# Patient Record
Sex: Male | Born: 1940 | Race: White | Hispanic: No | Smoking: Former smoker
Health system: Southern US, Community
[De-identification: ages and names within clinical notes are randomized; demographics above are authoritative.]

## PROBLEM LIST (undated history)

## (undated) DIAGNOSIS — I10 Essential (primary) hypertension: Secondary | ICD-10-CM

## (undated) DIAGNOSIS — G4730 Sleep apnea, unspecified: Secondary | ICD-10-CM

## (undated) DIAGNOSIS — R011 Cardiac murmur, unspecified: Secondary | ICD-10-CM

## (undated) DIAGNOSIS — Z9981 Dependence on supplemental oxygen: Secondary | ICD-10-CM

## (undated) DIAGNOSIS — F32A Depression, unspecified: Secondary | ICD-10-CM

## (undated) DIAGNOSIS — J449 Chronic obstructive pulmonary disease, unspecified: Secondary | ICD-10-CM

## (undated) DIAGNOSIS — J439 Emphysema, unspecified: Secondary | ICD-10-CM

## (undated) DIAGNOSIS — K529 Noninfective gastroenteritis and colitis, unspecified: Secondary | ICD-10-CM

## (undated) DIAGNOSIS — Z972 Presence of dental prosthetic device (complete) (partial): Secondary | ICD-10-CM

## (undated) DIAGNOSIS — K219 Gastro-esophageal reflux disease without esophagitis: Secondary | ICD-10-CM

## (undated) DIAGNOSIS — IMO0001 Reserved for inherently not codable concepts without codable children: Secondary | ICD-10-CM

## (undated) DIAGNOSIS — F329 Major depressive disorder, single episode, unspecified: Secondary | ICD-10-CM

## (undated) HISTORY — PX: CATARACT EXTRACTION: SUR2

## (undated) HISTORY — PX: LUNG SURGERY: SHX703

## (undated) HISTORY — PX: WRIST SURGERY: SHX841

## (undated) HISTORY — PX: EYE SURGERY: SHX253

## (undated) HISTORY — PX: KNEE SURGERY: SHX244

---

## 2005-05-10 ENCOUNTER — Emergency Department: Payer: Self-pay | Admitting: Internal Medicine

## 2010-12-13 ENCOUNTER — Ambulatory Visit: Payer: Self-pay | Admitting: Family Medicine

## 2011-04-05 ENCOUNTER — Encounter (HOSPITAL_COMMUNITY)
Admission: RE | Admit: 2011-04-05 | Discharge: 2011-04-05 | Disposition: A | Discharge status: Home / Self Care | Payer: BC Managed Care – PPO | Source: Ambulatory Visit | Attending: Orthopedic Surgery | Admitting: Orthopedic Surgery

## 2011-04-05 ENCOUNTER — Ambulatory Visit (HOSPITAL_COMMUNITY)
Admission: RE | Admit: 2011-04-05 | Discharge: 2011-04-05 | Disposition: A | Discharge status: Home / Self Care | Payer: BC Managed Care – PPO | Source: Ambulatory Visit | Attending: Orthopedic Surgery | Admitting: Orthopedic Surgery

## 2011-04-05 ENCOUNTER — Other Ambulatory Visit (HOSPITAL_COMMUNITY): Payer: Self-pay | Admitting: Orthopedic Surgery

## 2011-04-05 DIAGNOSIS — M25531 Pain in right wrist: Secondary | ICD-10-CM

## 2011-04-05 DIAGNOSIS — J438 Other emphysema: Secondary | ICD-10-CM | POA: Insufficient documentation

## 2011-04-05 DIAGNOSIS — Z01810 Encounter for preprocedural cardiovascular examination: Secondary | ICD-10-CM | POA: Insufficient documentation

## 2011-04-05 DIAGNOSIS — Z01818 Encounter for other preprocedural examination: Secondary | ICD-10-CM | POA: Insufficient documentation

## 2011-04-05 DIAGNOSIS — Z01812 Encounter for preprocedural laboratory examination: Secondary | ICD-10-CM | POA: Insufficient documentation

## 2011-04-05 LAB — BASIC METABOLIC PANEL
BUN: 21 mg/dL (ref 6–23)
CO2: 28 mEq/L (ref 19–32)
Calcium: 9.6 mg/dL (ref 8.4–10.5)
Chloride: 105 mEq/L (ref 96–112)
Creatinine, Ser: 1.45 mg/dL — ABNORMAL HIGH (ref 0.50–1.35)
GFR calc Af Amer: 55 mL/min — ABNORMAL LOW (ref >90)
GFR calc non Af Amer: 48 mL/min — ABNORMAL LOW (ref >90)
Glucose, Bld: 122 mg/dL — ABNORMAL HIGH (ref 70–99)
Potassium: 5.3 mEq/L — ABNORMAL HIGH (ref 3.5–5.1)
Sodium: 141 mEq/L (ref 135–145)

## 2011-04-05 LAB — SURGICAL PCR SCREEN
MRSA, PCR: NEGATIVE
Staphylococcus aureus: NEGATIVE

## 2011-04-05 LAB — CBC
HCT: 42.2 % (ref 39.0–52.0)
Hemoglobin: 13.9 g/dL (ref 13.0–17.0)
MCH: 30.4 pg (ref 26.0–34.0)
MCHC: 32.9 g/dL (ref 30.0–36.0)
MCV: 92.3 fL (ref 78.0–100.0)
Platelets: 272 10*3/uL (ref 150–400)
RBC: 4.57 MIL/uL (ref 4.22–5.81)
RDW: 20.5 % — ABNORMAL HIGH (ref 11.5–15.5)
WBC: 6.7 10*3/uL (ref 4.0–10.5)

## 2011-04-12 ENCOUNTER — Other Ambulatory Visit: Payer: Self-pay | Admitting: Orthopedic Surgery

## 2011-04-12 ENCOUNTER — Ambulatory Visit (HOSPITAL_COMMUNITY)
Admission: RE | Admit: 2011-04-12 | Discharge: 2011-04-12 | Disposition: A | Discharge status: Home / Self Care | Payer: BC Managed Care – PPO | Source: Ambulatory Visit | Attending: Orthopedic Surgery | Admitting: Orthopedic Surgery

## 2011-04-12 DIAGNOSIS — G5600 Carpal tunnel syndrome, unspecified upper limb: Secondary | ICD-10-CM | POA: Insufficient documentation

## 2011-04-12 DIAGNOSIS — N289 Disorder of kidney and ureter, unspecified: Secondary | ICD-10-CM | POA: Insufficient documentation

## 2011-04-12 DIAGNOSIS — J4489 Other specified chronic obstructive pulmonary disease: Secondary | ICD-10-CM | POA: Insufficient documentation

## 2011-04-12 DIAGNOSIS — K5190 Ulcerative colitis, unspecified, without complications: Secondary | ICD-10-CM | POA: Insufficient documentation

## 2011-04-12 DIAGNOSIS — G4730 Sleep apnea, unspecified: Secondary | ICD-10-CM | POA: Insufficient documentation

## 2011-04-12 DIAGNOSIS — Z01818 Encounter for other preprocedural examination: Secondary | ICD-10-CM | POA: Insufficient documentation

## 2011-04-12 DIAGNOSIS — IMO0002 Reserved for concepts with insufficient information to code with codable children: Secondary | ICD-10-CM | POA: Insufficient documentation

## 2011-04-12 DIAGNOSIS — M19039 Primary osteoarthritis, unspecified wrist: Secondary | ICD-10-CM | POA: Insufficient documentation

## 2011-04-12 DIAGNOSIS — M21939 Unspecified acquired deformity of unspecified forearm: Secondary | ICD-10-CM | POA: Insufficient documentation

## 2011-04-12 DIAGNOSIS — K219 Gastro-esophageal reflux disease without esophagitis: Secondary | ICD-10-CM | POA: Insufficient documentation

## 2011-04-12 DIAGNOSIS — J449 Chronic obstructive pulmonary disease, unspecified: Secondary | ICD-10-CM | POA: Insufficient documentation

## 2011-04-12 DIAGNOSIS — Z01812 Encounter for preprocedural laboratory examination: Secondary | ICD-10-CM | POA: Insufficient documentation

## 2011-04-12 DIAGNOSIS — Z01810 Encounter for preprocedural cardiovascular examination: Secondary | ICD-10-CM | POA: Insufficient documentation

## 2011-04-18 NOTE — Op Note (Signed)
Darin Lopez, Darin Lopez NO.:  [id number]  MEDICAL RECORD NO.:  [id number]  LOCATION:  [facility]                         FACILITY:  [facility]  PHYSICIAN:  Artist Pais. 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  OPERATIVE PROCEDURE:  Right wrist proximal row carpectomy with posterior interosseous nerve neurectomy and right carpal tunnel release through separate incision.  SURGEON:  Artist Pais. Mina Marble, MD  ASSISTANT:  None.  ANESTHESIA:  Axillary block and IV sedation.  TOURNIQUET TIME:  59 minutes.  COMPLICATION:  None.  DRAINS:  None.  The patient was taken to the operating suite after induction of adequate IV sedation and axillary block analgesia.  Right upper extremity was prepped and draped in the usual sterile fashion.  An Esmarch was used to exsanguinate the limb.  Tourniquet was inflated to 275 mmHg.  At this point in time, a 2-cm incision was made in the palmar aspect of the right hand in line with long finger metacarpal starting at [facility] cardinal line.  Skin was incised.  Palmar fascia was identified and split.  Distal edge of the transverse carpal was identified with a #15 blade.  The median nerve was identified and protected with a Therapist, nutritional.  Remaining aspects of the transverse carpal ligament were then divided under direct vision using curved blunt scissors.  The canal was inspected.  There was no osseous lesion or gangrene was present.  It was irrigated and loosely closed with a 3-0 Prolene subcuticular stitch. The hand was then fully pronated.  Midline incision was made over the radiocarpal  articulation.  Skin was incised longitudinally.  Dorsal veins and the extensor mechanism were carefully identified and retracted.  The interval between the second and fourth dorsal compartments was identified.  The sheath overlying the second dorsal compartment was incised.  These tendons were retracted to the radial side and the fourth dorsal compartment to the ulnar side and a midline dissection was carried down to the level of the floor of the fourth dorsal compartment where the posterior osseous nerve was identified and transected and was sent for pathologic confirmation.  Posterior interosseous artery was carefully cauterized.  Next, the midline incision was made in the capsule.  Dissection was carried down to the proximal carpal row.  There was significant gapping of the scapholunate interval with DJD changes at the radioscaphoid articulation.  The scaphoid, the lunate, and the triquetrum were carefully removed in piecemeal using curettes, osteotomes, and rongeurs.  Once this was done, all bone fragments were carefully excised off the volar capsule.  There was some slight wearing of the capitate head.  Intraoperative fluoroscopy revealed the good placement of the capitate head into the lunate fossa.  At this point in time, a small capsular flap was developed and sewn to the volar aspect of the wound to cover the small area denuded cartilage on the capitate head.  Once this was done, the wound was again thoroughly irrigated and loosely closed in layers of 2-0 Ethibond to close the capsule, which was also used to suture the capsular flap to the volar capsule and then 4-0 Vicryl to realign the extensor mechanism.  The wound was then closed with 3-0 Prolene subcuticular stitch, Steri-Strips, 4 x 4s fluffs, and a volar splint was applied.  The patient tolerated the procedure well and went to the recovery room in stable fashion.     Artist Pais Mina Marble, M.D.     MAW/MEDQ  D:   04/12/2011  T:  04/12/2011  Job:  [id number]  Electronically Signed by Dairl Ponder M.D. on 04/18/2011 02:07:13 PM

## 2011-06-17 ENCOUNTER — Emergency Department: Payer: Self-pay | Admitting: Emergency Medicine

## 2011-09-13 ENCOUNTER — Other Ambulatory Visit: Payer: Self-pay | Admitting: Orthopedic Surgery

## 2011-10-02 ENCOUNTER — Encounter (HOSPITAL_COMMUNITY): Payer: Self-pay | Admitting: Pharmacy Technician

## 2011-10-04 ENCOUNTER — Encounter (HOSPITAL_COMMUNITY)
Admission: RE | Admit: 2011-10-04 | Discharge: 2011-10-04 | Disposition: A | Discharge status: Home / Self Care | Payer: BC Managed Care – PPO | Source: Ambulatory Visit | Attending: Orthopedic Surgery | Admitting: Orthopedic Surgery

## 2011-10-04 ENCOUNTER — Encounter (HOSPITAL_COMMUNITY): Payer: Self-pay

## 2011-10-04 HISTORY — DX: Chronic obstructive pulmonary disease, unspecified: J44.9

## 2011-10-04 HISTORY — DX: Dependence on supplemental oxygen: Z99.81

## 2011-10-04 HISTORY — DX: Major depressive disorder, single episode, unspecified: F32.9

## 2011-10-04 HISTORY — DX: Depression, unspecified: F32.A

## 2011-10-04 HISTORY — DX: Noninfective gastroenteritis and colitis, unspecified: K52.9

## 2011-10-04 HISTORY — DX: Gastro-esophageal reflux disease without esophagitis: K21.9

## 2011-10-04 HISTORY — DX: Emphysema, unspecified: J43.9

## 2011-10-04 LAB — CBC
Hemoglobin: 13.9 g/dL (ref 13.0–17.0)
MCH: 33.1 pg (ref 26.0–34.0)
MCHC: 33.1 g/dL (ref 30.0–36.0)
RDW: 17.7 % — ABNORMAL HIGH (ref 11.5–15.5)

## 2011-10-04 LAB — SURGICAL PCR SCREEN
MRSA, PCR: NEGATIVE
Staphylococcus aureus: NEGATIVE

## 2011-10-04 LAB — BASIC METABOLIC PANEL
BUN: 13 mg/dL (ref 6–23)
Creatinine, Ser: 0.86 mg/dL (ref 0.50–1.35)
GFR calc Af Amer: >90 mL/min (ref >90)
GFR calc non Af Amer: 86 mL/min — ABNORMAL LOW (ref >90)
Glucose, Bld: 102 mg/dL — ABNORMAL HIGH (ref 70–99)
Potassium: 3.9 mEq/L (ref 3.5–5.1)

## 2011-10-04 MED ORDER — CHLORHEXIDINE GLUCONATE 4 % EX LIQD: 60.0000 mL | Freq: Once | CUTANEOUS | Status: DC

## 2011-10-04 NOTE — Pre-Procedure Instructions (Signed)
20 Darin Lopez  10/04/2011   Your procedure is scheduled on:  October 12, 2011  Report to [facility] at 7:30 AM.  Call this number if you have problems the morning of surgery: [phone]   Remember:   Do not eat food:After Midnight.  May have clear liquids: up to 4 Hours before arrival.  Clear liquids include soda, tea, black coffee, apple or grape juice, broth.  Take these medicines the morning of surgery with A SIP OF WATER: PRILOSEC,INHALERS, ZOLOFT,BACTRIM   Do not wear jewelry, make-up or nail polish.  Do not wear lotions, powders, or perfumes. You may wear deodorant.  Do not shave 48 hours prior to surgery.  Do not bring valuables to the hospital.  Contacts, dentures or bridgework may not be worn into surgery.  Leave suitcase in the car. After surgery it may be brought to your room.  For patients admitted to the hospital, checkout time is 11:00 AM the day of discharge.   Patients discharged the day of surgery will not be allowed to drive home.  Name and phone number of your driver: Herma Ard  Special Instructions: Incentive Spirometry - Practice and bring it with you on the day of surgery. and CHG Shower Use Special Wash: 1/2 bottle night before surgery and 1/2 bottle morning of surgery.   Please read over the following fact sheets that you were given: Pain Booklet, MRSA Information and Surgical Site Infection Prevention

## 2011-10-07 NOTE — Consult Note (Addendum)
Anesthesia:  Patient is a 71 year old male scheduled for a open carpel tunnel release on the right on 10/12/11.  Posted for [facility].  His history is significant for COPD/emphysema with prior bilateral upper lobectomies for severe emphysematous changes (bilateral lung volume reduction surgery), home 02, OSA with CPAP, former smoker, GERD, depression, colitis.  He is s/p right wrist proximal row carpectomy on 04/12/11 using axillary block and IV sedation.  I was not asked to see Mr. Dimaio during his PAT visit.  RR was 22, 02 sat was 92% at PAT.  EKG from 04/12/11 shows NSR.  He had a echo at [facility] on 12/13/10 that showed normal LV size and function, EF 50-55%, LA mildly dilated, fibrocalcified AV without significant AS.  (Copies on chart, from [facility]  CXR from 04/05/11 showed: No evidence of acute cardiopulmonary disease.  Emphysematous changes.  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  His last sleep studies from [facility] are > 3 year (2009, 2010), but copies are on his chart (from [facility]).  Labs acceptable.  I was unable to reach him on his home or cell number today, but I left a message asking him to call me.  I'd like to confirm that he feels at his baseline from a pulmonary standpoint.  He tolerated prior wrist surgery in October, so would anticipate he could proceed if remains stable from a Pulmonary standpoint.  Addendum: 10/10/11 1615  Patient called me back yesterday. He sees a Diplomatic Services operational officer (Dr. Marlane Mingle) at the [facility] in [place].  His breathing is at baseline.  He denies SOB at rest.  He does have DOE with moderate activity like walking up an incline.  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  Last records from Dr. Sunday Spillers reviewed.  He was last seen on 03/28/11.  As above, subjectively he is at his baseline and tolerated similar procedure  in October of last year.  If no new respiratory issues, then anticipate he can proceed.  Anesthesiologist Dr. Chaney Malling agrees with plan.

## 2011-10-10 NOTE — Progress Notes (Signed)
Requested pul. Notes from Dr. Sunday Spillers at [facility] in [facility]

## 2011-10-11 MED ORDER — CEFAZOLIN SODIUM-DEXTROSE 2-3 GM-% IV SOLR
2.0000 g | INTRAVENOUS | Status: AC
  Administered 2011-10-12: 2 g via INTRAVENOUS
  Filled 2011-10-11: qty 50

## 2011-10-11 MED ORDER — CEFAZOLIN SODIUM-DEXTROSE 2-3 GM-% IV SOLR
2.0000 g | INTRAVENOUS | Status: DC
  Filled 2011-10-11: qty 50

## 2011-10-12 ENCOUNTER — Encounter (HOSPITAL_COMMUNITY): Payer: Self-pay | Admitting: Vascular Surgery

## 2011-10-12 ENCOUNTER — Encounter (HOSPITAL_COMMUNITY)
Admission: RE | Disposition: A | Discharge status: Home / Self Care | Payer: Self-pay | Source: Ambulatory Visit | Attending: Orthopedic Surgery

## 2011-10-12 ENCOUNTER — Ambulatory Visit (HOSPITAL_COMMUNITY)
Admission: RE | Admit: 2011-10-12 | Discharge: 2011-10-12 | Disposition: A | Discharge status: Home / Self Care | Payer: BC Managed Care – PPO | Source: Ambulatory Visit | Attending: Orthopedic Surgery | Admitting: Orthopedic Surgery

## 2011-10-12 ENCOUNTER — Ambulatory Visit (HOSPITAL_COMMUNITY): Payer: BC Managed Care – PPO | Admitting: Vascular Surgery

## 2011-10-12 DIAGNOSIS — G5600 Carpal tunnel syndrome, unspecified upper limb: Secondary | ICD-10-CM | POA: Insufficient documentation

## 2011-10-12 DIAGNOSIS — G4730 Sleep apnea, unspecified: Secondary | ICD-10-CM | POA: Insufficient documentation

## 2011-10-12 DIAGNOSIS — Z01812 Encounter for preprocedural laboratory examination: Secondary | ICD-10-CM | POA: Insufficient documentation

## 2011-10-12 DIAGNOSIS — J449 Chronic obstructive pulmonary disease, unspecified: Secondary | ICD-10-CM | POA: Insufficient documentation

## 2011-10-12 DIAGNOSIS — I10 Essential (primary) hypertension: Secondary | ICD-10-CM | POA: Insufficient documentation

## 2011-10-12 DIAGNOSIS — J4489 Other specified chronic obstructive pulmonary disease: Secondary | ICD-10-CM | POA: Insufficient documentation

## 2011-10-12 DIAGNOSIS — G5601 Carpal tunnel syndrome, right upper limb: Secondary | ICD-10-CM

## 2011-10-12 DIAGNOSIS — K219 Gastro-esophageal reflux disease without esophagitis: Secondary | ICD-10-CM | POA: Insufficient documentation

## 2011-10-12 HISTORY — PX: CARPAL TUNNEL RELEASE: SHX101

## 2011-10-12 SURGERY — CARPAL TUNNEL RELEASE
Anesthesia: Choice | Site: Wrist | Laterality: Right | Wound class: Clean

## 2011-10-12 MED ORDER — OXYCODONE-ACETAMINOPHEN 5-325 MG PO TABS: 1.0000 | ORAL_TABLET | ORAL | Status: AC | PRN

## 2011-10-12 MED ORDER — LACTATED RINGERS IV SOLN
INTRAVENOUS | Status: DC | PRN
  Administered 2011-10-12: 09:00:00 via INTRAVENOUS

## 2011-10-12 MED ORDER — EPHEDRINE SULFATE 50 MG/ML IJ SOLN
INTRAMUSCULAR | Status: DC | PRN
  Administered 2011-10-12: 5 mg via INTRAVENOUS

## 2011-10-12 MED ORDER — OXYCODONE-ACETAMINOPHEN 5-325 MG PO TABS
1.0000 | ORAL_TABLET | ORAL | Status: DC | PRN
  Administered 2011-10-12: 1 via ORAL

## 2011-10-12 MED ORDER — FENTANYL CITRATE 0.05 MG/ML IJ SOLN: 25.0000 ug | INTRAMUSCULAR | Status: DC | PRN

## 2011-10-12 MED ORDER — BUPIVACAINE HCL (PF) 0.25 % IJ SOLN
INTRAMUSCULAR | Status: DC | PRN
  Administered 2011-10-12: 7 mL

## 2011-10-12 MED ORDER — PROPOFOL 10 MG/ML IV BOLUS
INTRAVENOUS | Status: DC | PRN
  Administered 2011-10-12: 200 mg via INTRAVENOUS

## 2011-10-12 MED ORDER — FENTANYL CITRATE 0.05 MG/ML IJ SOLN
INTRAMUSCULAR | Status: DC | PRN
  Administered 2011-10-12 (×3): 50 ug via INTRAVENOUS

## 2011-10-12 MED ORDER — ONDANSETRON HCL 4 MG/2ML IJ SOLN
INTRAMUSCULAR | Status: DC | PRN
  Administered 2011-10-12: 4 mg via INTRAVENOUS

## 2011-10-12 MED ORDER — 0.9 % SODIUM CHLORIDE (POUR BTL) OPTIME
TOPICAL | Status: DC | PRN
  Administered 2011-10-12: 1000 mL

## 2011-10-12 SURGICAL SUPPLY — 44 items
BANDAGE ELASTIC 3 VELCRO ST LF (GAUZE/BANDAGES/DRESSINGS) ×2 IMPLANT
BANDAGE ELASTIC 4 VELCRO ST LF (GAUZE/BANDAGES/DRESSINGS) ×2 IMPLANT
BANDAGE GAUZE ELAST BULKY 4 IN (GAUZE/BANDAGES/DRESSINGS) ×2 IMPLANT
BNDG CMPR 9X4 STRL LF SNTH (GAUZE/BANDAGES/DRESSINGS) ×1
BNDG ESMARK 4X9 LF (GAUZE/BANDAGES/DRESSINGS) ×2 IMPLANT
CLOTH BEACON ORANGE TIMEOUT ST (SAFETY) ×2 IMPLANT
CORDS BIPOLAR (ELECTRODE) ×2 IMPLANT
COVER MAYO STAND STRL (DRAPES) IMPLANT
COVER SURGICAL LIGHT HANDLE (MISCELLANEOUS) ×2 IMPLANT
CUFF TOURNIQUET SINGLE 18IN (TOURNIQUET CUFF) ×2 IMPLANT
CUFF TOURNIQUET SINGLE 24IN (TOURNIQUET CUFF) IMPLANT
DRAPE SURG 17X23 STRL (DRAPES) ×2 IMPLANT
DURAPREP 26ML APPLICATOR (WOUND CARE) ×2 IMPLANT
GAUZE XEROFORM 1X8 LF (GAUZE/BANDAGES/DRESSINGS) ×2 IMPLANT
GLOVE BIO SURGEON STRL SZ8.5 (GLOVE) ×2 IMPLANT
GOWN PREVENTION PLUS XXLARGE (GOWN DISPOSABLE) ×2 IMPLANT
GOWN SRG XL XLNG 56XLVL 4 (GOWN DISPOSABLE) ×2 IMPLANT
GOWN STRL NON-REIN LRG LVL3 (GOWN DISPOSABLE) IMPLANT
GOWN STRL NON-REIN XL XLG LVL4 (GOWN DISPOSABLE) ×4
KIT BASIN OR (CUSTOM PROCEDURE TRAY) IMPLANT
KIT ROOM TURNOVER OR (KITS) ×2 IMPLANT
NEEDLE 22X1 1/2 (OR ONLY) (NEEDLE) IMPLANT
NEEDLE HYPO 25GX1X1/2 BEV (NEEDLE) ×2 IMPLANT
NS IRRIG 1000ML POUR BTL (IV SOLUTION) ×2 IMPLANT
PACK ORTHO EXTREMITY (CUSTOM PROCEDURE TRAY) ×2 IMPLANT
PAD ARMBOARD 7.5X6 YLW CONV (MISCELLANEOUS) ×2 IMPLANT
PAD CAST 4YDX4 CTTN HI CHSV (CAST SUPPLIES) ×1 IMPLANT
PADDING CAST ABS 3INX4YD NS (CAST SUPPLIES) ×1
PADDING CAST ABS COTTON 3X4 (CAST SUPPLIES) ×1 IMPLANT
PADDING CAST COTTON 4X4 STRL (CAST SUPPLIES) ×2
SPLINT PLASTER CAST XFAST 4X15 (CAST SUPPLIES) ×1 IMPLANT
SPLINT PLASTER XTRA FAST SET 4 (CAST SUPPLIES) ×1
SPONGE GAUZE 4X4 12PLY (GAUZE/BANDAGES/DRESSINGS) ×2 IMPLANT
STRIP CLOSURE SKIN 1/2X4 (GAUZE/BANDAGES/DRESSINGS) ×2 IMPLANT
SUT ETHILON 4 0 PS 2 18 (SUTURE) IMPLANT
SUT PROLENE 3 0 PS 2 (SUTURE) IMPLANT
SUT VIC AB 3-0 PS2 18 (SUTURE)
SUT VIC AB 3-0 PS2 18XBRD (SUTURE) IMPLANT
SUT VICRYL RAPIDE 4/0 PS 2 (SUTURE) ×4 IMPLANT
SYR CONTROL 10ML LL (SYRINGE) ×2 IMPLANT
TOWEL OR 17X24 6PK STRL BLUE (TOWEL DISPOSABLE) ×2 IMPLANT
TOWEL OR 17X26 10 PK STRL BLUE (TOWEL DISPOSABLE) ×2 IMPLANT
UNDERPAD 30X30 INCONTINENT (UNDERPADS AND DIAPERS) ×2 IMPLANT
WATER STERILE IRR 1000ML POUR (IV SOLUTION) ×2 IMPLANT

## 2011-10-12 NOTE — Op Note (Signed)
See dictated note [phone]

## 2011-10-12 NOTE — Anesthesia Postprocedure Evaluation (Signed)
  Anesthesia Post-op Note  Patient: Darin Lopez  Procedure(s) Performed: Procedure(s) (LRB): CARPAL TUNNEL RELEASE (Right)  Patient Location: PACU  Anesthesia Type: General  Level of Consciousness: awake, alert  and oriented  Airway and Oxygen Therapy: Patient Spontanous Breathing and Patient connected to nasal cannula oxygen  Post-op Pain: none  Post-op Assessment: Post-op Vital signs reviewed, Patient's Cardiovascular Status Stable, Respiratory Function Stable, Patent Airway, No signs of Nausea or vomiting and Pain level controlled  Post-op Vital Signs: Reviewed and stable  Complications: No apparent anesthesia complications

## 2011-10-12 NOTE — H&P (Signed)
Darin Lopez is an 71 y.o. male.   Chief Complaint: right recurrent carpal tunnel syndrome HPI: 71 y/o male with h/o right wrist PRC and CTR now with recurrent CTS  Past Medical History  Diagnosis Date  . COPD (chronic obstructive pulmonary disease)   . Emphysema of lung   . Colitis   . GERD (gastroesophageal reflux disease)   . Emphysema   . On home oxygen therapy   . Depression     Past Surgical History  Procedure Date  . Lung surgery     UPPER LOBE REMOVED FOR EMPHYSEMA  . Wrist surgery   . Knee surgery     LEFT BONE SPUR REMOVAL  . Eye surgery     BILATERAL    No family history on file. Social History:  reports that he has quit smoking. His smoking use included Cigarettes. He does not have any smokeless tobacco history on file. He reports that he does not drink alcohol or use illicit drugs.  Allergies:  Allergies  Allergen Reactions  . Adhesive (Tape) Other (See Comments)    SKIN RASH  . Aspirin Other (See Comments)    GI Bleed  . Flagyl (Metronidazole Hcl) Nausea And Vomiting    Medications Prior to Admission  Medication Dose Route Frequency Provider Last Rate Last Dose  . ceFAZolin (ANCEF) IVPB 2 g/50 mL premix  2 g Intravenous 60 min Pre-Op Marlowe Shores, MD      . DISCONTD: ceFAZolin (ANCEF) IVPB 2 g/50 mL premix  2 g Intravenous 60 min Pre-Op Marlowe Shores, MD       Medications Prior to Admission  Medication Sig Dispense Refill  . B Complex Vitamins (B COMPLEX PO) Take 1 tablet by mouth daily.      . Certolizumab Pegol (CIMZIA) 2 X 200 MG KIT Inject 400 mg into the skin every 14 (fourteen) days. 400 mg twice monthly      . diphenhydrAMINE (BENADRYL) 25 MG tablet Take 25 mg by mouth every evening.      . diphenoxylate-atropine (LOMOTIL) 2.5-0.025 MG per tablet Take 1 tablet by mouth 4 (four) times daily as needed. For diarrhea      . gabapentin (NEURONTIN) 300 MG capsule Take 300 mg by mouth 2 (two) times daily.      . mercaptopurine (PURINETHOL)  50 MG tablet Take 100 mg by mouth daily.      . mesalamine (PENTASA) 250 MG CR capsule Take 1,500 mg by mouth daily.      . mometasone (ASMANEX 30 METERED DOSES) 220 MCG/INH inhaler Inhale 1 puff into the lungs 2 (two) times daily.      Marland Kitchen omeprazole (PRILOSEC) 20 MG capsule Take 20 mg by mouth daily.      . predniSONE (DELTASONE) 10 MG tablet Take 10 mg by mouth daily.      . Probiotic Product (PROBIOTIC COMPLEX ACIDOPHILUS PO) Take 1 capsule by mouth daily.      . psyllium (METAMUCIL) 58.6 % powder Take 1 packet by mouth daily.      . salmeterol (SEREVENT) 50 MCG/DOSE diskus inhaler Inhale 1 puff into the lungs daily.      . sertraline (ZOLOFT) 50 MG tablet Take 50 mg by mouth daily.      Marland Kitchen sulfamethoxazole-trimethoprim (BACTRIM,SEPTRA) 400-80 MG per tablet Take 1 tablet by mouth daily.      Marland Kitchen terazosin (HYTRIN) 10 MG capsule Take 10 mg by mouth daily.      Marland Kitchen tiotropium (SPIRIVA) 18  MCG inhalation capsule Place 18 mcg into inhaler and inhale daily.      Marland Kitchen zolpidem (AMBIEN) 10 MG tablet Take 10 mg by mouth at bedtime.      Marland Kitchen CALCIUM CITRATE PO Take 1 tablet by mouth daily.        No results found for this or any previous visit (from the past 48 hour(s)). No results found.  Review of Systems  All other systems reviewed and are negative.    Blood pressure 142/84, pulse 72, temperature 97.6 F (36.4 C), temperature source Oral, resp. rate 20, SpO2 94.00%. Physical Exam  Constitutional: He is oriented to person, place, and time. He appears well-developed and well-nourished.  HENT:  Head: Normocephalic and atraumatic.  Cardiovascular: Normal rate.   Respiratory: Effort normal.  Musculoskeletal:       Right wrist: He exhibits tenderness.       Arms: Neurological: He is alert and oriented to person, place, and time.  Skin: Skin is warm.  Psychiatric: He has a normal mood and affect. His speech is normal and behavior is normal. Thought content normal.     Assessment/Plan 70y/o male  with recurrent right CTS  Plan open CTR  Darin Lopez A 10/12/2011, 7:43 AM

## 2011-10-12 NOTE — Discharge Instructions (Signed)
Carpal Tunnel Surgery The carpal tunnel is a narrow hollow area in the wrist. It is formed by the wrist bones and ligaments. Nerves, blood vessels, and tendons on the palm side of your hand pass through the carpal tunnel. (The palm side is the side of your hand in the direction your fingers bend.) Repeated wrist motion or certain diseases may cause swelling within the tunnel. That is why these are sometimes called repetitive trauma disorders. It is also a common problem in late pregnancy because of water retention. This swelling pinches the main nerve in the wrist (median nerve). It causes the painful condition called carpal tunnel syndrome. A feeling of "pins and needles" may be noticed in the fingers or hand. The entire arm may ache from this condition. Carpal tunnel syndrome may clear up by itself. Cortisone injections may help. An electromyogram may be needed to confirm this diagnosis. This is a test which measures nerve conduction. The nerve conduction is usually slowed in a carpal tunnel syndrome. Sometimes, an operation may be needed to free the pinched nerve.  LET YOUR CAREGIVER KNOW ABOUT:  Allergies   Medications taken including herbs, eye drops, over the counter medications, and creams   Use of steroids (by mouth or creams)   Previous problems with anesthetics or novocaine   Possibility of pregnancy, if this applies   History of blood clots (thrombophlebitis)   History of bleeding or blood problems   Previous surgery   Other health problems  RISKS AND COMPLICATIONS  Infection: A germ starts growing in the wound. This can usually be treated with antibiotics.   Damage to other organs may occur.   Bleeding following surgery can be a complication of almost all surgeries. Your surgeon takes every precaution to keep this from happening.   Recurrence (return) of carpal tunnel syndrome following treatment is rare.  BEFORE THE PROCEDURE  Stop smoking at least two weeks prior to  surgery. This lowers risk during surgery.   Stop non steroidal medicine for ten days prior to surgery. Also, do not take aspirin unless OK'd by your surgeon.   Your caregiver may tell you to stop taking certain medicine that may affect the outcome of the surgery and your ability to heal. For example, you may need to stop taking anti-inflammatories, such as aspirin, because of possible bleeding problems. Other medicine may have interactions with anesthesia.   BE SURE TO LET YOUR CAREGIVER KNOW IF YOU HAVE BEEN ON STEROIDS (INCLUDING CREAMS) FOR LONG PERIODS OF TIME. THIS IS CRITICAL.   Your caregiver will discuss possible risks and complications with you before surgery. In addition to the usual risks of anesthesia, other common risks and complications include:   Temporary increase in pain due to surgery.   Uncorrected pain.   Infection.  You should be present 60 minutes before your procedure or as directed.  PROCEDURE  Carpal tunnel release is generally recommended if symptoms last for 6 months. Surgery involves severing the band of tissue around the wrist to reduce pressure on the median nerve. Surgery is done under local anesthesia and does not require an overnight hospital stay. Many patients require surgery on both hands. The following are types of carpal tunnel release surgery:   Open release surgery, the traditional procedure used to correct carpal tunnel syndrome, consists of making an incision up to 2 inches in the wrist and then cutting the carpal ligament to enlarge the carpal tunnel. The procedure is generally done under local anesthesia on an outpatient  basis, unless there are unusual medical considerations.   Endoscopic surgery may allow faster functional recovery and less post-operative discomfort than traditional open release surgery. The surgeon makes two incisions (cuts) (about 1/2 inch each) in the wrist and palm, inserts a camera attached to a tube, looks at the tissue on a  screen, and cuts the carpal ligament (the tissue that holds joints together). This two-portal endoscopic surgery, generally performed under local anesthesia, is effective and minimizes scarring and scar tenderness, if any. One-portal endoscopic surgery for carpal tunnel syndrome is also available.  Although symptoms may be better right after surgery, full recovery from carpal tunnel surgery can take months. Some patients may have infection, nerve damage, stiffness, and pain at the scar. Sometimes the wrist loses strength because the carpal ligament is cut. Patients should take part in physical therapy after surgery to restore wrist strength. Some patients may need to adjust job duties or even change jobs after recovery from surgery. The majority of patients recover completely without complications (additional problems). AFTER THE PROCEDURE After surgery, you will be taken to the recovery area where a nurse will watch and check your progress. Once you're awake, stable, and taking fluids well, without other problems you will be allowed to go home. HOME CARE INSTRUCTIONS   Once at home, an ice pack applied to your operative site may help with discomfort and keep the swelling down.   Follow your caregiver's instructions as to activities, exercises, physical therapy, and driving a car.   Maintain strength and range of motion as instructed.   If you were given a splint to keep your wrist from bending, use it as instructed. It is important to wear the splint at night. Use the splint for as long as you have pain or numbness in your hand, arm or wrist. This may take 1 to 2 months.   If you have pain at night, it may help to elevate your hand above the level of your heart (the center of your chest).   It is important to avoid activities which originally caused your carpal tunnel syndrome for a couple weeks following surgery, or as directed by your surgeon. If your symptoms are work-related, you may need to  talk to your employer about changing to a job that does not require using your wrist.   Only take over-the-counter or prescription medicines for pain, discomfort, or fever as directed by your caregiver.   Following periods of extended use, particularly hard (strenuous) use, apply an ice pack wrapped in a towel to the palm (anterior) side of the affected wrist for 20 to 30 minutes. Repeat as needed three to four times per day. This will help reduce swelling following surgery.  SEEK MEDICAL CARE IF:   There is increased bleeding (more than a small spot) from the wound.   You notice redness, swelling, or increasing pain in the wound.   Pus is coming from wound.   An unexplained oral temperature above 102 F (38.9 C) develops.   You notice a foul smell coming from the wound or dressing.  SEEK IMMEDIATE MEDICAL CARE IF:   You develop a rash.   You have difficulty breathing.   You have any problems you think are related to allergies.  Document Released: 01/30/2004 Document Revised: 06/06/2011 Document Reviewed: 04/23/2007 [facility] Patient Information 2012 [place], [place].

## 2011-10-12 NOTE — Op Note (Signed)
Darin Lopez, FEILD NO.:  [id number]  MEDICAL RECORD NO.:  [id number]  LOCATION:  [facility]                         FACILITY:  [facility]  PHYSICIAN:  Artist Pais. Nahzir Pohle, M.D.DATE OF BIRTH:  02/26/1941  DATE OF PROCEDURE:  10/12/2011 DATE OF DISCHARGE:                              OPERATIVE REPORT   PREOPERATIVE DIAGNOSIS:  Chronic right carpal tunnel syndrome.  POSTOPERATIVE DIAGNOSIS:  Chronic right carpal tunnel syndrome.  PROCEDURE:  Right carpal tunnel release open with flexor synovectomy.  SURGEON:  Artist Pais. Mina Marble, M.D.  ASSISTANT:  None.  ANESTHESIA:  General.  COMPLICATION:  No complication.  DRAINS:  No drains.  SPECIMEN:  One specimen sent.  DESCRIPTION OF PROCEDURE:  The patient was taken to the operating suite. After induction of adequate general anesthesia, right upper extremity was prepped and draped in sterile fashion.  An Esmarch was used to exsanguinate the limb.  Tourniquet inflated 265 mmHg.  At this point in time, an incision made in the palmar aspect of the hand, then thenar crease going in [place] fashion across the distal forearm and wrist crease proximally in line with palmaris longus tendon.  Skin was incised.  Cutaneous vessels were carefully identified and retracted. The median nerve was identified in the antecubital fascia and released the level of the carpal canal.  The carpal canal was released.  There was significant scarring about the carpal canal and the nerve had an hourglass appearance.  There was also significant hypertrophic synovium throughout the entire FDS and FDP tendon compartments.  The wound was irrigated.  The synovium was carefully resected using a rongeur and tenotomy scissors.  There is again significant compression hourglass type at the level of the proximal carpal canal.  The nerve was then placed back in the carpal canal, and this was then irrigated and loosely closed with 4- 0 Vicryl Rapide suture.   Xeroform, 4x4s, fluffs, and a volar splint was applied.  The patient tolerated the procedure well and went to the recovery room in stable fashion.     Artist Pais Mina Marble, M.D.     MAW/MEDQ  D:  10/12/2011  T:  10/12/2011  Job:  [id number]

## 2011-10-12 NOTE — Transfer of Care (Signed)
Immediate Anesthesia Transfer of Care Note  Patient: Darin Lopez  Procedure(s) Performed: Procedure(s) (LRB): CARPAL TUNNEL RELEASE (Right)  Patient Location: PACU  Anesthesia Type: General  Level of Consciousness: patient cooperative, lethargic and responds to stimulation  Airway & Oxygen Therapy: Patient Spontanous Breathing and Patient connected to nasal cannula oxygen  Post-op Assessment: Report given to PACU RN and Post -op Vital signs reviewed and stable  Post vital signs: Reviewed and stable  Complications: No apparent anesthesia complications

## 2011-10-12 NOTE — Anesthesia Procedure Notes (Signed)
Procedure Name: LMA Insertion Date/Time: 10/12/2011 9:05 AM Performed by: Delbert Harness Pre-anesthesia Checklist: Patient identified, Timeout performed, Emergency Drugs available, Suction available and Patient being monitored Patient Re-evaluated:Patient Re-evaluated prior to inductionOxygen Delivery Method: Circle system utilized Preoxygenation: Pre-oxygenation with 100% oxygen Intubation Type: IV induction LMA: LMA with gastric port inserted LMA Size: 4.0 Number of attempts: 1 Placement Confirmation: positive ETCO2 and breath sounds checked- equal and bilateral Tube secured with: Tape Dental Injury: Teeth and Oropharynx as per pre-operative assessment  Comments: lower dental partial not secure, removed and labeled.

## 2011-10-12 NOTE — Brief Op Note (Signed)
10/12/2011  9:41 AM  PATIENT:  Darin Lopez  71 y.o. male  PRE-OPERATIVE DIAGNOSIS:  RECURRENT CARPAL TUNNEL SYNDROME RIGHT  POST-OPERATIVE DIAGNOSIS:  same  PROCEDURE:  Procedure(s) (LRB): CARPAL TUNNEL RELEASE (Right)  SURGEON:  Surgeon(s) and Role:    * Marlowe Shores, MD - Primary  PHYSICIAN ASSISTANT:   ASSISTANTS: none   ANESTHESIA:   general  EBL:  Total I/O In: 600 [I.V.:600] Out: -   BLOOD ADMINISTERED:none  DRAINS: none   LOCAL MEDICATIONS USED:  MARCAINE   8cc  SPECIMEN:  Excision  DISPOSITION OF SPECIMEN:  PATHOLOGY  COUNTS:  YES  TOURNIQUET:   Total Tourniquet Time Documented: Upper Arm (Right) - 27 minutes  DICTATION: .Other Dictation: Dictation Number [id number]  PLAN OF CARE: Discharge to home after PACU  PATIENT DISPOSITION:  PACU - hemodynamically stable.   Delay start of Pharmacological VTE agent (>24hrs) due to surgical blood loss or risk of bleeding: not applicable

## 2011-10-12 NOTE — Preoperative (Signed)
Beta Blockers   Reason not to administer Beta Blockers:Not Applicable 

## 2011-10-12 NOTE — Anesthesia Preprocedure Evaluation (Addendum)
Anesthesia Evaluation  Patient identified by MRN, date of birth, ID band Patient awake    Reviewed: Allergy & Precautions, H&P , NPO status , Patient's Chart, lab work & pertinent test results  History of Anesthesia Complications Negative for: history of anesthetic complications  Airway Mallampati: II TM Distance: >3 FB Neck ROM: Full    Dental  (+) Edentulous Upper and Implants   Pulmonary sleep apnea, Continuous Positive Airway Pressure Ventilation and Oxygen sleep apnea , COPD (steroid dependent, took prednisone today) COPD inhaler and oxygen dependent, former smoker (quit 30 years) Right upper lobectomies bilateral. See Pre-Anesthesia note breath sounds clear to auscultation  Pulmonary exam normal       Cardiovascular hypertension, Pt. on medications Rhythm:Regular Rate:Normal  EF 50-55%   Neuro/Psych PSYCHIATRIC DISORDERS Depression negative neurological ROS     GI/Hepatic Neg liver ROS, GERD-  Medicated and Controlled,  Endo/Other  negative endocrine ROS  Renal/GU negative Renal ROS     Musculoskeletal   Abdominal   Peds  Hematology negative hematology ROS (+)   Anesthesia Other Findings   Reproductive/Obstetrics                        Anesthesia Physical Anesthesia Plan  ASA: III  Anesthesia Plan: General   Post-op Pain Management:    Induction: Intravenous  Airway Management Planned: LMA  Additional Equipment:   Intra-op Plan:   Post-operative Plan:   Informed Consent: I have reviewed the patients History and Physical, chart, labs and discussed the procedure including the risks, benefits and alternatives for the proposed anesthesia with the patient or authorized representative who has indicated his/her understanding and acceptance.   Dental advisory given  Plan Discussed with: Surgeon and CRNA  Anesthesia Plan Comments: (Plan routine monitors, GA- LMA OK)         Anesthesia Quick Evaluation

## 2011-10-14 ENCOUNTER — Encounter (HOSPITAL_COMMUNITY): Payer: Self-pay | Admitting: Orthopedic Surgery

## 2012-01-15 DIAGNOSIS — R42 Dizziness and giddiness: Secondary | ICD-10-CM | POA: Insufficient documentation

## 2012-01-15 DIAGNOSIS — R251 Tremor, unspecified: Secondary | ICD-10-CM | POA: Insufficient documentation

## 2012-01-20 ENCOUNTER — Encounter: Payer: Self-pay | Admitting: Orthopedic Surgery

## 2012-01-30 ENCOUNTER — Encounter: Payer: Self-pay | Admitting: Orthopedic Surgery

## 2012-03-01 ENCOUNTER — Encounter: Payer: Self-pay | Admitting: Orthopedic Surgery

## 2012-04-24 DIAGNOSIS — H35379 Puckering of macula, unspecified eye: Secondary | ICD-10-CM | POA: Insufficient documentation

## 2012-04-24 DIAGNOSIS — Z961 Presence of intraocular lens: Secondary | ICD-10-CM | POA: Insufficient documentation

## 2012-10-23 DIAGNOSIS — K5190 Ulcerative colitis, unspecified, without complications: Secondary | ICD-10-CM | POA: Insufficient documentation

## 2012-10-23 DIAGNOSIS — J449 Chronic obstructive pulmonary disease, unspecified: Secondary | ICD-10-CM | POA: Insufficient documentation

## 2012-12-20 IMAGING — CR DG CHEST 2V
2 series · 2 of 2 positions shown · non-contrast
Comparison: None.

CLINICAL DATA: Preop right wrist corpectomy, history of emphysema

CHEST - 2 VIEW

[view not recorded (1 of 2)]
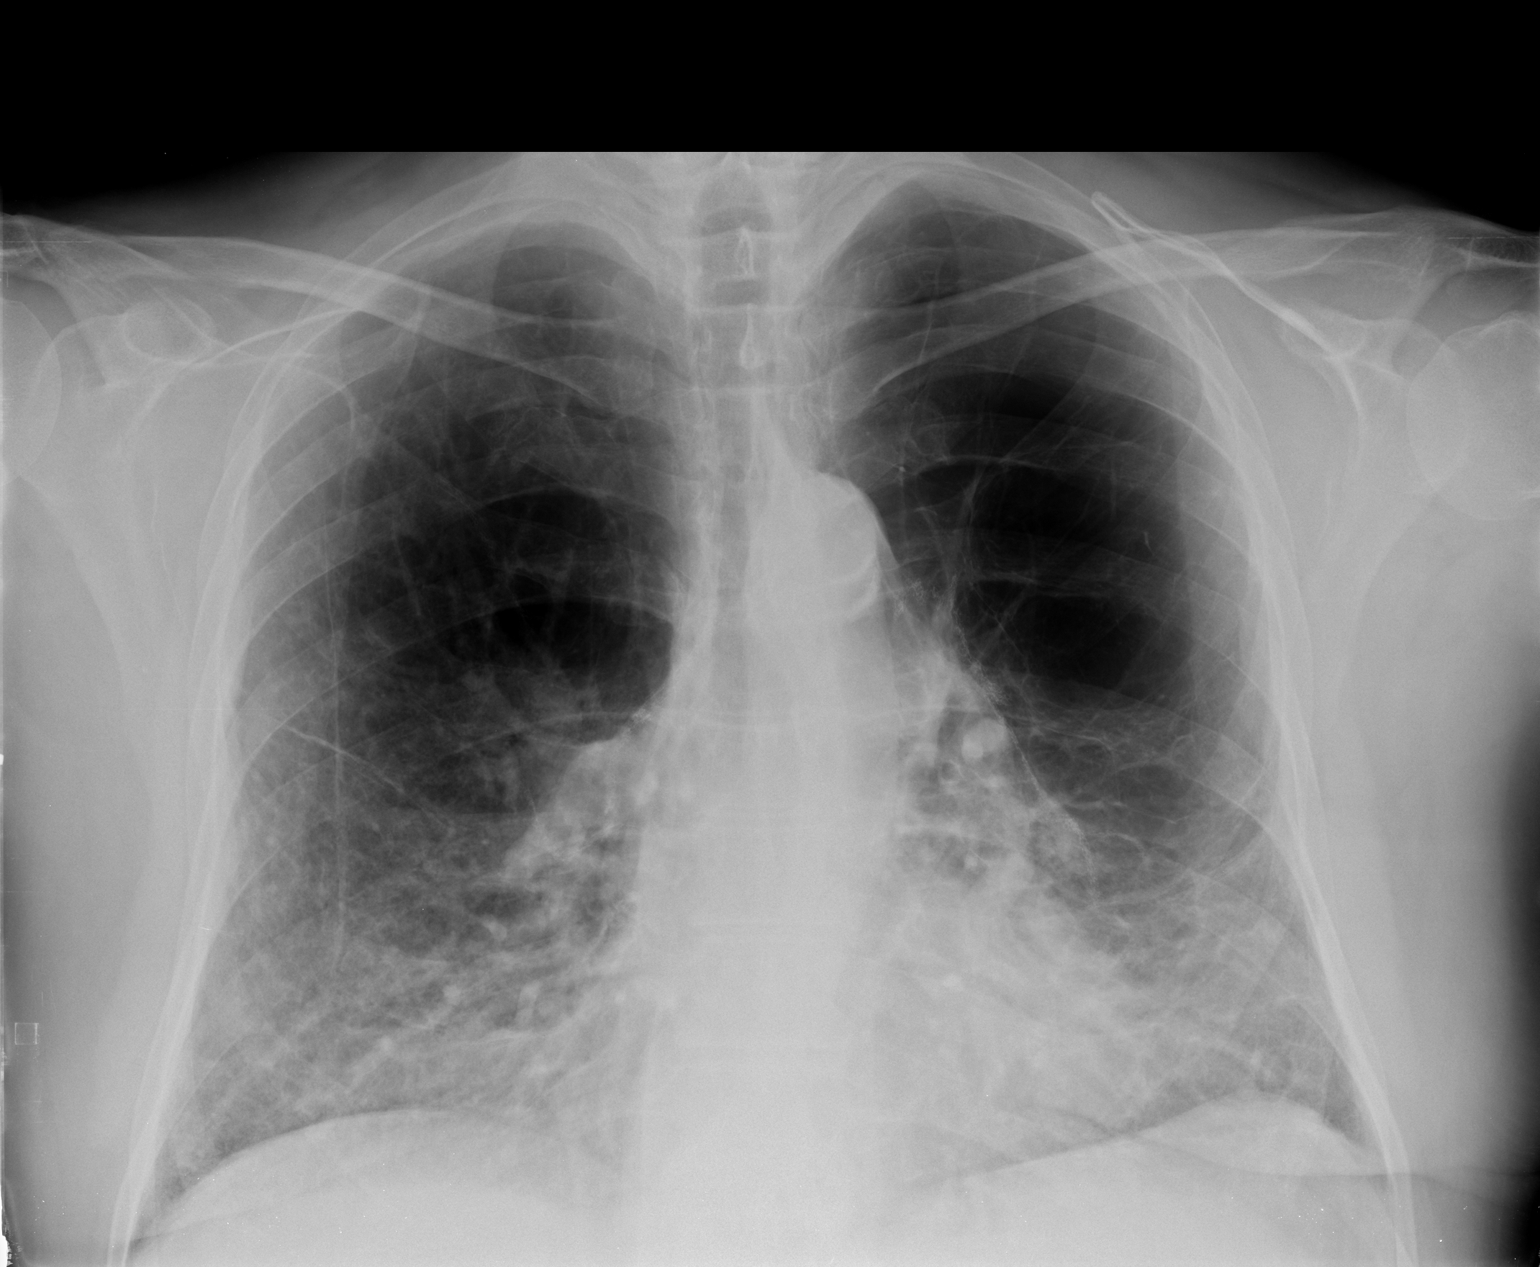

[view not recorded (2 of 2)]
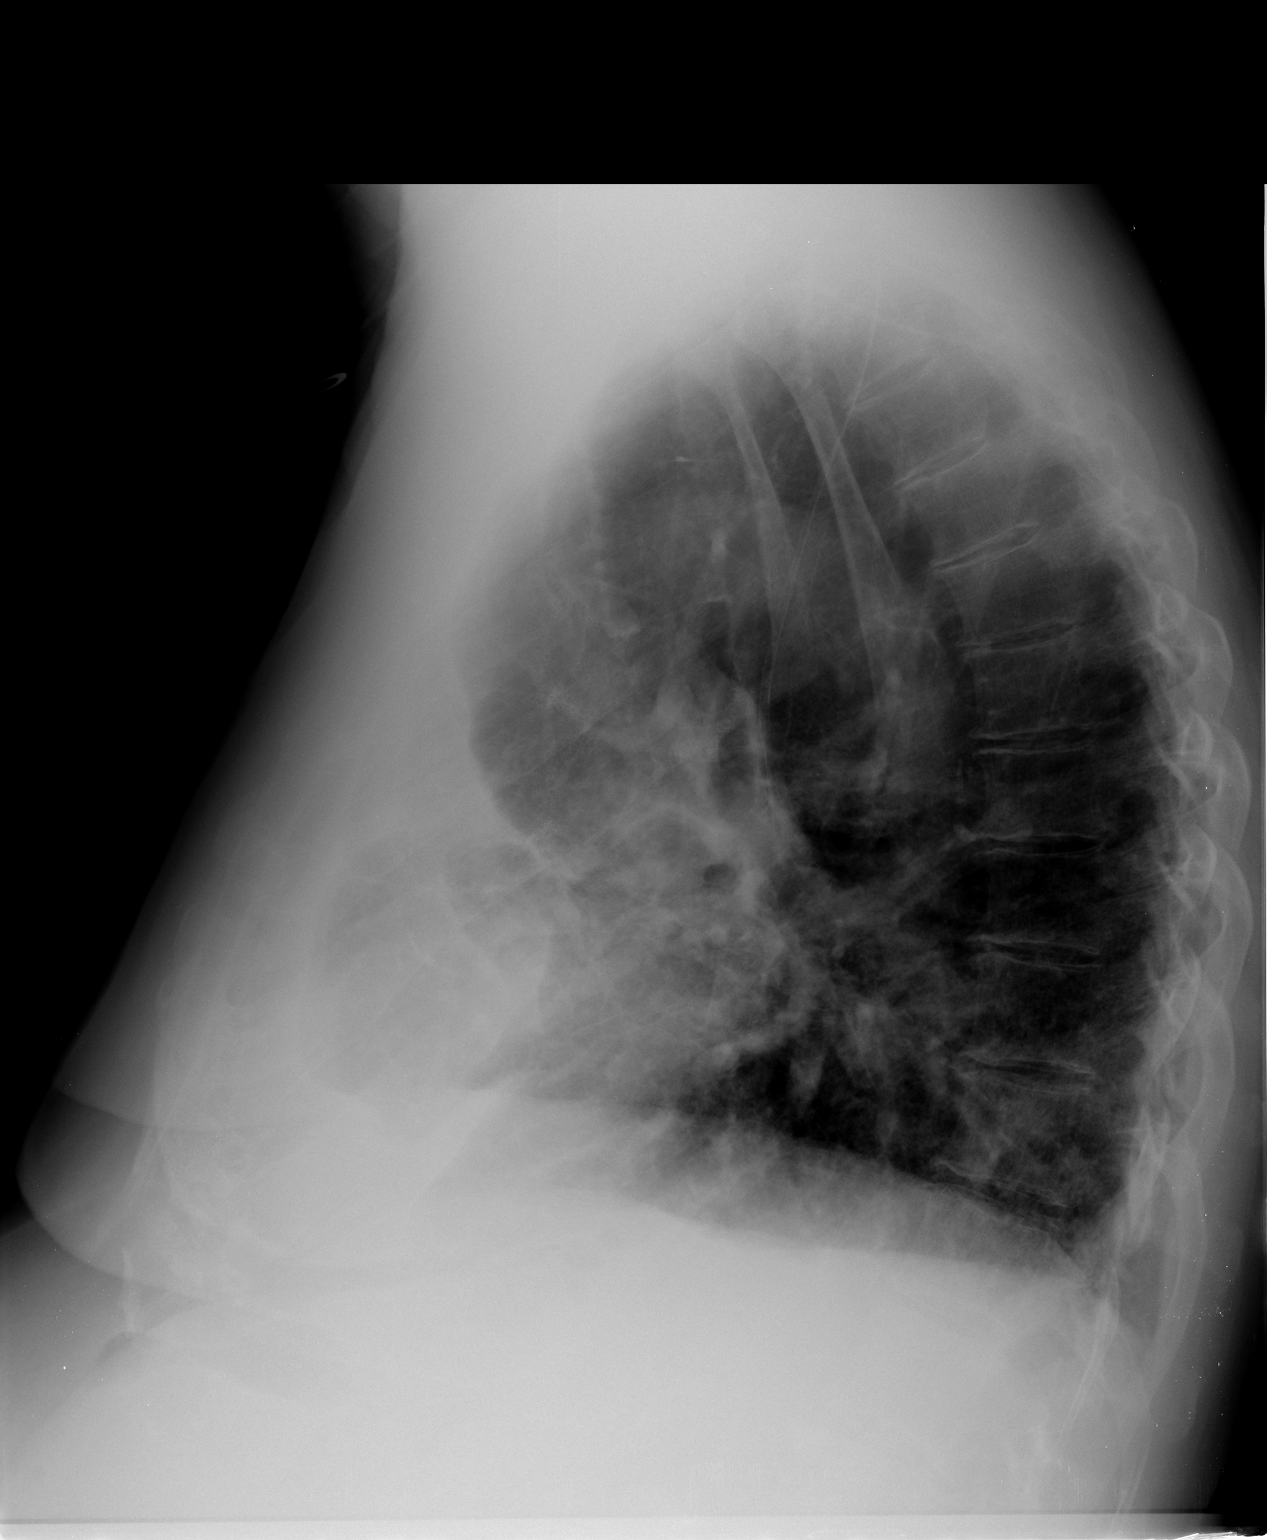

[2 of 2 positions shown; findings below may reference images not displayed]

FINDINGS: Emphysematous changes.  Chronic interstitial markings
with lower lobe fibrosis.  No opacities suspicious for pneumonia.
No pleural effusion or pneumothorax.

Cardiomediastinal silhouette is within normal limits.

Mild degenerative changes of the visualized thoracolumbar spine.
IMPRESSION: No evidence of acute cardiopulmonary disease.

Emphysematous changes.

Bilateral lower lobe fibrosis, possibly reflecting interstitial
lung disease.

## 2013-07-02 ENCOUNTER — Inpatient Hospital Stay: Payer: Self-pay | Admitting: Internal Medicine

## 2013-07-02 LAB — URINALYSIS, COMPLETE
BACTERIA: NONE SEEN
BILIRUBIN, UR: NEGATIVE
Blood: NEGATIVE
GLUCOSE, UR: NEGATIVE mg/dL (ref 0–75)
KETONE: NEGATIVE
LEUKOCYTE ESTERASE: NEGATIVE
Nitrite: NEGATIVE
Ph: 6 (ref 4.5–8.0)
Protein: 30
RBC,UR: 4 /HPF (ref 0–5)
SPECIFIC GRAVITY: 1.029 (ref 1.003–1.030)
SQUAMOUS EPITHELIAL: NONE SEEN

## 2013-07-02 LAB — CBC
HCT: 35.8 % — ABNORMAL LOW (ref 40.0–52.0)
HGB: 11.9 g/dL — ABNORMAL LOW (ref 13.0–18.0)
MCH: 33.2 pg (ref 26.0–34.0)
MCHC: 33.1 g/dL (ref 32.0–36.0)
MCV: 100 fL (ref 80–100)
Platelet: 266 10*3/uL (ref 150–440)
RBC: 3.58 10*6/uL — AB (ref 4.40–5.90)
RDW: 17.2 % — ABNORMAL HIGH (ref 11.5–14.5)
WBC: 2.7 10*3/uL — ABNORMAL LOW (ref 3.8–10.6)

## 2013-07-02 LAB — BASIC METABOLIC PANEL
Anion Gap: 7 (ref 7–16)
BUN: 14 mg/dL (ref 7–18)
CALCIUM: 9.1 mg/dL (ref 8.5–10.1)
CO2: 26 mmol/L (ref 21–32)
Chloride: 104 mmol/L (ref 98–107)
Creatinine: 1.14 mg/dL (ref 0.60–1.30)
EGFR (Non-African Amer.): >60
GLUCOSE: 105 mg/dL — AB (ref 65–99)
OSMOLALITY: 275 (ref 275–301)
POTASSIUM: 4 mmol/L (ref 3.5–5.1)
SODIUM: 137 mmol/L (ref 136–145)

## 2013-07-02 LAB — TROPONIN I

## 2013-07-02 LAB — PRO B NATRIURETIC PEPTIDE: B-TYPE NATIURETIC PEPTID: 109 pg/mL (ref 0–125)

## 2013-07-03 LAB — BASIC METABOLIC PANEL
ANION GAP: 6 — AB (ref 7–16)
BUN: 14 mg/dL (ref 7–18)
CHLORIDE: 104 mmol/L (ref 98–107)
CREATININE: 1.04 mg/dL (ref 0.60–1.30)
Calcium, Total: 8.9 mg/dL (ref 8.5–10.1)
Co2: 26 mmol/L (ref 21–32)
EGFR (African American): >60
EGFR (Non-African Amer.): >60
GLUCOSE: 118 mg/dL — AB (ref 65–99)
OSMOLALITY: 274 (ref 275–301)
Potassium: 3.8 mmol/L (ref 3.5–5.1)
SODIUM: 136 mmol/L (ref 136–145)

## 2013-07-03 LAB — CBC WITH DIFFERENTIAL/PLATELET
BASOS PCT: 0.1 %
Basophil #: 0 10*3/uL (ref 0.0–0.1)
EOS PCT: 0.1 %
Eosinophil #: 0 10*3/uL (ref 0.0–0.7)
HCT: 33.3 % — AB (ref 40.0–52.0)
HGB: 10.9 g/dL — ABNORMAL LOW (ref 13.0–18.0)
LYMPHS PCT: 12.7 %
Lymphocyte #: 0.3 10*3/uL — ABNORMAL LOW (ref 1.0–3.6)
MCH: 32.7 pg (ref 26.0–34.0)
MCHC: 32.7 g/dL (ref 32.0–36.0)
MCV: 100 fL (ref 80–100)
MONO ABS: 0.1 x10 3/mm — AB (ref 0.2–1.0)
Monocyte %: 3.5 %
NEUTROS ABS: 2.2 10*3/uL (ref 1.4–6.5)
Neutrophil %: 83.6 %
Platelet: 256 10*3/uL (ref 150–440)
RBC: 3.33 10*6/uL — AB (ref 4.40–5.90)
RDW: 17.2 % — ABNORMAL HIGH (ref 11.5–14.5)
WBC: 2.7 10*3/uL — ABNORMAL LOW (ref 3.8–10.6)

## 2013-07-03 LAB — MAGNESIUM: Magnesium: 2 mg/dL

## 2013-07-07 LAB — CULTURE, BLOOD (SINGLE)

## 2013-08-11 LAB — CBC
HCT: 32.5 % — AB (ref 40.0–52.0)
HGB: 10.6 g/dL — AB (ref 13.0–18.0)
MCH: 33.9 pg (ref 26.0–34.0)
MCHC: 32.6 g/dL (ref 32.0–36.0)
MCV: 104 fL — AB (ref 80–100)
Platelet: 360 10*3/uL (ref 150–440)
RBC: 3.12 10*6/uL — AB (ref 4.40–5.90)
RDW: 20.1 % — ABNORMAL HIGH (ref 11.5–14.5)
WBC: 2.2 10*3/uL — AB (ref 3.8–10.6)

## 2013-08-11 LAB — CBC WITH DIFFERENTIAL/PLATELET
BASOS ABS: 0 10*3/uL (ref 0.0–0.1)
Basophil %: 0.3 %
EOS ABS: 0.1 10*3/uL (ref 0.0–0.7)
EOS PCT: 5.5 %
HCT: 31.3 % — AB (ref 40.0–52.0)
HGB: 10.2 g/dL — AB (ref 13.0–18.0)
Lymphocyte #: 1.1 10*3/uL (ref 1.0–3.6)
Lymphocyte %: 47.1 %
MCH: 34.1 pg — AB (ref 26.0–34.0)
MCHC: 32.6 g/dL (ref 32.0–36.0)
MCV: 105 fL — ABNORMAL HIGH (ref 80–100)
MONO ABS: 0.1 x10 3/mm — AB (ref 0.2–1.0)
Monocyte %: 6.2 %
NEUTROS ABS: 0.9 10*3/uL — AB (ref 1.4–6.5)
NEUTROS PCT: 40.9 %
PLATELETS: 333 10*3/uL (ref 150–440)
RBC: 2.99 10*6/uL — AB (ref 4.40–5.90)
RDW: 21.1 % — ABNORMAL HIGH (ref 11.5–14.5)
WBC: 2.2 10*3/uL — AB (ref 3.8–10.6)

## 2013-08-11 LAB — BASIC METABOLIC PANEL
ANION GAP: 4 — AB (ref 7–16)
BUN: 17 mg/dL (ref 7–18)
CALCIUM: 8.9 mg/dL (ref 8.5–10.1)
CO2: 27 mmol/L (ref 21–32)
Chloride: 106 mmol/L (ref 98–107)
Creatinine: 1.25 mg/dL (ref 0.60–1.30)
EGFR (African American): >60
GFR CALC NON AF AMER: 58 — AB
Glucose: 147 mg/dL — ABNORMAL HIGH (ref 65–99)
OSMOLALITY: 278 (ref 275–301)
Potassium: 4.7 mmol/L (ref 3.5–5.1)
SODIUM: 137 mmol/L (ref 136–145)

## 2013-08-11 LAB — TROPONIN I
TROPONIN-I: 0.19 ng/mL — AB
Troponin-I: <0.02 ng/mL

## 2013-08-11 LAB — APTT: Activated PTT: 42.2 secs — ABNORMAL HIGH (ref 23.6–35.9)

## 2013-08-12 ENCOUNTER — Inpatient Hospital Stay: Payer: Self-pay | Admitting: Internal Medicine

## 2013-08-12 LAB — CBC WITH DIFFERENTIAL/PLATELET
BASOS ABS: 0 10*3/uL (ref 0.0–0.1)
BASOS PCT: 0.5 %
EOS ABS: 0.2 10*3/uL (ref 0.0–0.7)
Eosinophil %: 8.5 %
HCT: 27.7 % — ABNORMAL LOW (ref 40.0–52.0)
HGB: 8.9 g/dL — AB (ref 13.0–18.0)
Lymphocyte #: 0.9 10*3/uL — ABNORMAL LOW (ref 1.0–3.6)
Lymphocyte %: 39.3 %
MCH: 33.6 pg (ref 26.0–34.0)
MCHC: 32.1 g/dL (ref 32.0–36.0)
MCV: 105 fL — ABNORMAL HIGH (ref 80–100)
MONOS PCT: 7.1 %
Monocyte #: 0.2 x10 3/mm (ref 0.2–1.0)
NEUTROS ABS: 1 10*3/uL — AB (ref 1.4–6.5)
Neutrophil %: 44.6 %
Platelet: 317 10*3/uL (ref 150–440)
RBC: 2.65 10*6/uL — ABNORMAL LOW (ref 4.40–5.90)
RDW: 20.2 % — ABNORMAL HIGH (ref 11.5–14.5)
WBC: 2.2 10*3/uL — ABNORMAL LOW (ref 3.8–10.6)

## 2013-08-12 LAB — BASIC METABOLIC PANEL
ANION GAP: 6 — AB (ref 7–16)
BUN: 14 mg/dL (ref 7–18)
CREATININE: 1.07 mg/dL (ref 0.60–1.30)
Calcium, Total: 8.3 mg/dL — ABNORMAL LOW (ref 8.5–10.1)
Chloride: 109 mmol/L — ABNORMAL HIGH (ref 98–107)
Co2: 26 mmol/L (ref 21–32)
EGFR (Non-African Amer.): >60
Glucose: 140 mg/dL — ABNORMAL HIGH (ref 65–99)
Osmolality: 284 (ref 275–301)
POTASSIUM: 4.2 mmol/L (ref 3.5–5.1)
SODIUM: 141 mmol/L (ref 136–145)

## 2013-08-12 LAB — APTT

## 2013-08-12 LAB — CK-MB
CK-MB: 18.1 ng/mL — AB (ref 0.5–3.6)
CK-MB: 28.6 ng/mL — ABNORMAL HIGH (ref 0.5–3.6)
CK-MB: 35.3 ng/mL — AB (ref 0.5–3.6)

## 2013-08-12 LAB — TROPONIN I: Troponin-I: 3 ng/mL — ABNORMAL HIGH

## 2014-08-17 DIAGNOSIS — C44621 Squamous cell carcinoma of skin of unspecified upper limb, including shoulder: Secondary | ICD-10-CM | POA: Insufficient documentation

## 2014-08-17 DIAGNOSIS — L570 Actinic keratosis: Secondary | ICD-10-CM | POA: Insufficient documentation

## 2014-08-17 DIAGNOSIS — Z85828 Personal history of other malignant neoplasm of skin: Secondary | ICD-10-CM | POA: Insufficient documentation

## 2014-10-22 NOTE — Consult Note (Signed)
PATIENT NAME:  Darin Lopez, Darin Lopez MR#:  [id number] DATE OF BIRTH:  09/15/1940  DATE OF CONSULTATION:  08/12/2013  REFERRING PHYSICIAN:   CONSULTING PHYSICIAN:  Dionisio David, MD  INDICATION FOR CONSULTATION: Chest pain.   HISTORY OF PRESENT ILLNESS: This is a 74 year old white male with a past medical history of severe COPD, who had bilateral lung resection at the bases, most likely due to severe COPD. He presented to the hospital with chest pain. The chest pain was described as sharp, associated with shortness of breath and diaphoresis. Currently, he is not having any chest pain. He had similar types of episodes in the past, but did not do anything. When he came in, his initial troponin was normal, but the followup troponin came back positive.   PAST MEDICAL HISTORY: History of severe COPD with lung reduction and hypertension. No history of diabetes or hypercholesterolemia.   SOCIAL HISTORY: He quit smoking 30 years ago. Denies EtOH abuse.   FAMILY HISTORY: His father died of myocardial infarction   ALLERGIES: FLAGYL AND PENICILLIN.   PHYSICAL EXAMINATION:  GENERAL: He is alert, oriented x 3, in no acute distress.   VITAL SIGNS: Stable.  NECK: No JVD.  LUNGS: Clear.  HEART: Regular rate and rhythm. Normal S1, S2. No audible murmur.  ABDOMEN: Soft, nontender, positive bowel sounds.  EXTREMITIES: No pedal edema.   EKG shows normal sinus rhythm at 70 beats per minute, nonspecific ST-T changes. His initial troponin was negative, but the third troponin is higher to 3.0.   ASSESSMENT AND PLAN: The patient may be having non-ST-elevation myocardial infarction with possible coronary artery disease with history of smoking and hypertension and at his age he is at risk for coronary artery disease. He does have also history of end-stage obstructive disease. CT of the chest was unremarkable for pulmonary embolism. Advise doing cardiac catheterization.  ____________________________ Dionisio David,  MD sak:aw D: 08/12/2013 08:44:10 ET T: 08/12/2013 08:50:06 ET JOB#: [id number]  cc: Dionisio David, MD, <Dictator> Dionisio David MD ELECTRONICALLY SIGNED 08/13/2013 8:39

## 2014-10-22 NOTE — H&P (Signed)
PATIENT NAME:  Darin Lopez, Darin Lopez MR#:  [id number] DATE OF BIRTH:  1940-10-01  DATE OF ADMISSION:  08/11/2013  PRIMARY CARE PHYSICIAN:  Dr. Clemmie Krill.  CHIEF COMPLAINT: Chest pain.   HISTORY OF PRESENT ILLNESS: This is a 74 year old man who developed chest pain while shopping in [employer] around 1 hour ago. The pain will not go away, left side of his chest, a sharp tight feeling. Nothing made it better or worse at this point. He received nitroglycerin and morphine without relief. It was 8 out of 10 in intensity. It went down to 4 out of 10, but now back up at 7 out of 10 in intensity. He always has shortness of breath. No sweating. No nausea. Hospitalist services were contacted for further evaluation.   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   PAST SURGICAL HISTORY: Lung reduction surgery each lung.   ALLERGIES: ASPIRIN, PENICILLIN AND FLAGYL.   SOCIAL HISTORY: Quit smoking 30 years ago. No alcohol. No drug use. 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   FAMILY HISTORY: Father died at 36 of MI. Mother died at 20 of old age.   CURRENT MEDICATIONS: Include albuterol CFC 2 puffs 4 times a day, Benadryl 25 mg at bedtime, gabapentin 600 mg at bedtime, mercaptopurine 50 mg daily, omeprazole 40 mg daily, Pentasa 500 mg 2 capsules once a day, prednisone 10 mg daily, probiotic formula daily, Zoloft 50 mg daily, terazosin 10 mg daily, Tylenol 1000 mg 2 tablets twice a day, Xopenex 2 puffs in the morning, Ambien 10 mg at bedtime.   REVIEW OF SYSTEMS:  CONSTITUTIONAL: Positive for weight loss, 210 to 180. Positive for fatigue. No fever, chills  or sweats.  EYES: He does wear glasses.  EARS, NOSE, MOUTH AND THROAT: No hearing loss. No sore throat. No difficulty swallowing.  CARDIOVASCULAR: Positive for chest pain.  RESPIRATORY: Positive for shortness of breath.  No cough. No sputum. No hemoptysis.  GASTROINTESTINAL: No nausea. No vomiting. No abdominal pain. Positive for diarrhea, occasional blood.  GENITOURINARY: No burning on urination. No hematuria.  MUSCULOSKELETAL: No joint pain or muscle pain.  INTEGUMENT: No rashes or eruptions.  NEUROLOGIC: Positive for dizziness spells.  INTEGUMENT: No rashes or eruptions.  PSYCHIATRIC: Positive for depression.  ENDOCRINE: No thyroid problems.  HEMATOLOGIC AND LYMPHATIC: No anemia. No easy bruising or bleeding.   PHYSICAL EXAMINATION: VITAL SIGNS: Temperature 97.5, pulse 64, respirations 22, blood pressure 114/64, pulse ox 99% on oxygen.  GENERAL: No respiratory distress.  EYES: Conjunctivae and lids normal. Pupils equal, round and reactive to light. Extraocular muscles intact. No nystagmus.  EARS, NOSE, MOUTH AND THROAT: Tympanic membranes: No erythema. Nasal mucosa: No erythema. Throat: No erythema. No exudate seen. Lips and gums: No lesions.  NECK: No JVD. No bruits. No lymphadenopathy. No thyromegaly. No thyroid nodules palpated.  RESPIRATORY: Decreased breath sounds bilaterally. No rhonchi, rales or wheeze heard.  CARDIOVASCULAR: S1, S2 normal. No gallops, rubs or murmurs heard. Carotid upstroke 2+ bilaterally. No bruits. Dorsalis pedal pulses 2+ bilaterally. All pulses equal throughout upper and lower extremities.  ABDOMEN: Soft, nontender. No organomegaly/splenomegaly. Normoactive bowel sounds. No masses felt.  LYMPHATIC: No lymph nodes in the neck.  MUSCULOSKELETAL: No clubbing, edema or cyanosis.  SKIN: No rashes or lesions.  NEUROLOGIC: Cranial nerves II through XII grossly intact. Deep tendon reflexes 2+ bilateral lower extremities.  PSYCHIATRIC: The patient is oriented to person, place  and time.   LABORATORY AND RADIOLOGICAL DATA: Chest x-ray showed COPD, scarring both lung, hazy opacity of left lung base, cannot exclude pneumonia. The patient does not have cough or fever or white count.  Troponin is negative. White blood cell count 10.2, H and H 10.6 and 32.5, platelet count of 360. Glucose 147, BUN 17, creatinine 1.25, sodium 137, potassium 4.7, chloride 106, CO2 of 27, calcium 8.9. EKG: I think has interference secondary to the patient's tremor. One EKG was read as accelerated junctional rhythm, but I am seeing P waves. Another one is read as a flutter, but I think this is the patient's tremor on the monitor. It is normal sinus rhythm, so I think he has normal sinus rhythm.   ASSESSMENT AND PLAN: 1.  Chest pain. The patient absolutely does not want a stress test. I will try oxycodone since there was no relief with nitroglycerin or morphine. I will get a CT scan of the chest to rule out pulmonary embolism.  2.  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 We will continue usual inhalers. CT scan will further evaluate the lungs, whether there is any infection in there or whether that is just scar tissue.  3.  Ulcerative colitis. Continue Pentasa, prednisone and 6-MP.  4.  Benign prostatic hypertrophy. Continue medication.  5.  Depression. Continue Zoloft.  6.  Neuropathy, on gabapentin.  7.  Gastroesophageal reflux disease, on omeprazole.   The patient is a DNR.   ____________________________ Tana Conch. Leslye Peer, MD rjw:dmm D: 08/11/2013 19:47:32 ET T: 08/11/2013 20:08:28 ET JOB#: [id number]  cc: Tana Conch. Leslye Peer, MD, <Dictator> Valetta Close, MD Marisue Brooklyn MD ELECTRONICALLY SIGNED 08/21/2013 12:22

## 2014-10-22 NOTE — H&P (Signed)
PATIENT NAME:  Darin Lopez, Darin Lopez MR#:  [id number] DATE OF BIRTH:  1941/02/12  DATE OF ADMISSION:  07/02/2013  PRIMARY CARE PHYSICIAN: Valetta Close, MD  PRIMARY PULMONOLOGIST: In [place] system.   REFERRING EMERGENCY ROOM PHYSICIAN: John H. Jasmine December, MD  CHIEF COMPLAINT: Shortness of breath.   HISTORY OF PRESENTING ILLNESS: A 74 year old male who has terminal COPD who is on 4 liters oxygen supplementation and had lung reduction surgery twice in the past. Has been feeling some shortness of breath for the last 4 to 5 days in day, and Dr. Clemmie Krill gave him Levaquin and oral prednisone tapering. The patient was taking it but was not getting any better with that, so finally decided to come to the Emergency Room. In the ER, he was given nebulizer treatment 2 to 3 times, IV steroid and magnesium, but he still did not stop wheezing and so finally decided to admit him to medical service for further management. On questioning, he did denies any fever, but he had cough and some sputum which is white or yellow. Denies any chest pain or palpitations.   REVIEW OF SYSTEMS:    CONSTITUTIONAL: Negative for fever, fatigue, weakness, pain or weight loss.  EYES: No blurring, double vision, discharge or redness.  EARS, NOSE, THROAT: No tinnitus, ear pain or hearing loss.  RESPIRATORY: Has cough and wheezing. No hemoptysis.  CARDIOVASCULAR: No chest pain, orthopnea, edema or palpitations.  GASTROINTESTINAL: No nausea, vomiting, diarrhea or abdominal pain.  GENITOURINARY: No dysuria, hematuria or increased frequency.  ENDOCRINE: No increased sweating. No heat or cold intolerance.  SKIN: No acne, rashes or lesions.  MUSCULOSKELETAL: No pain or swelling in the joints.  NEUROLOGICAL: No numbness, weakness, tremor or vertigo.  PSYCHIATRIC: No anxiety, insomnia, bipolar disorder, but currently appears a little anxious because of shortness of breath.  PAST MEDICAL HISTORY:  1.  COPD.  2.  Colitis.  3.  Asthma.  4.   Bronchitis.  5.  Emphysema.  6.  Lung cancer.   He also had hypertension and some heart murmur. Never had coronary artery disease. He is using 4 liters of oxygen at home continuously.   PAST SURGICAL HISTORY: Lung reduction surgery for his severe emphysema.   SOCIAL HISTORY: He started smoking at 74 years of age and smoked for almost 30 years. Diagnosed with COPD in 2008. Denies drinking alcohol or illegal drug use. 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   FAMILY HISTORY: Mother has permanent pacemaker, and sister was diagnosed with lung cancer 2 years ago.   HOME MEDICATION: He takes 2 different types of inhalers and nebulizers and then some other medication. Currently he does not remember the names. We will have to get further information from his pharmacy or PMD. Spoke to the pharmacy technician and will get it later on.  PHYSICAL EXAMINATION:  VITAL SIGNS: In ER, temperature 97.9, pulse 92, respiratory rate 26 which came down to 20, blood pressure 137/83, and oxygen saturation ranging from 92 to 96 with 4 liters oxygen supplementation.  GENERAL: The patient is fully alert and oriented, appears slightly anxious, but cooperative with history taking and physical examination.  HEENT: Head and neck atraumatic. Conjunctivae pink. Oral mucosa moist.  NECK: Supple. No JVD.  RESPIRATORY: Bilateral equal air entry. Extensive wheezing present. Use of accessory respiratory muscles present. Using nasal cannula oxygen supplementation, 4 liters.  CARDIOVASCULAR: S1, S2 present, regular. Murmur present.  ABDOMEN: Soft, nontender. Bowel sounds present. No organomegaly.  SKIN: No rashes.  EXTREMITIES: Legs: No edema.  NEUROLOGICAL: Power 5/5. Follows commands. Moves all 4 limbs. There is some tremor and some anxiety present.   IMPORTANT LABORATORY RESULTS IN THE HOSPITAL: Glucose 105, BNP 109, BUN 14, creatinine 1.14,  sodium 137, potassium 4.0, chloride 104, CO2 of 26, calcium 9.1. Troponin less than 0.02. WBC 2.7, hemoglobin 11.9, platelet count 266. Urinalysis is grossly negative. EKG is normal sinus rhythm. Chest x-ray finding of COPD.   ASSESSMENT AND PLAN: A 74 year old male with terminal chronic obstructive pulmonary disease, on oxygen chronic use, was on Levaquin and oral steroid for the last few days. Came to the hospital after not getting relief with this therapy. Still has significant wheezing.  1.  Chronic obstructive pulmonary disease exacerbation. Will give him IV steroid, nebulizer treatment with bronchodilators and steroid inhaler and will give him Spiriva. Will continue oxygen supplemental therapy as he is using at home.  2.  For anxiety, will give him Xanax 0.25 mg as needed. That might be also playing some role in his chronic obstructive pulmonary disease exacerbation.  3.  Acute on chronic respiratory failure, feeling excessively short of breath even using 4 to 5 liters of oxygen, which he uses at home. Oxygen saturation is fine though, but he has significant wheezing. Will continue his oxygen and will treat the underlying cause.  4.  Hypertension, currently stable, so will not give any medication at this time.  5.  Leukocytopenia. White cell count is low at 2.7. We will monitor it.   TOTAL TIME SPENT ON ADMISSION: 50 minutes.    ____________________________ Ceasar Lund Anselm Jungling, MD vgv:jcm D: 07/02/2013 19:29:02 ET T: 07/02/2013 20:09:11 ET JOB#: [id number]  cc: Ceasar Lund. Anselm Jungling, MD, <Dictator> Valetta Close, MD Vaughan Basta MD ELECTRONICALLY SIGNED 07/07/2013 13:52

## 2014-10-22 NOTE — Discharge Summary (Signed)
PATIENT NAME:  Darin Lopez, Darin Lopez MR#:  [id number] DATE OF BIRTH:  August 04, 1940  DATE OF ADMISSION:  07/02/2013 DATE OF DISCHARGE:  07/03/2013  PRIMARY CARE PHYSICIAN:  Dr. Clemmie Krill.  DISCHARGE DIAGNOSES: 1.  Chronic obstructive pulmonary disease exacerbation. 2.  Acute on chronic respiratory failure.  3.  Leukocytopenia.  4.  Anxiety. 5.  Colitis. 6.  Lung cancer.   CONDITION:  Stable.   CODE STATUS:  FULL CODE.   HOME MEDICATIONS:  Please refer to the [facility] physician instruction medication reconciliation list.   DIET:  Low sodium.    The patient should continue home oxygen by nasal cannula at 4 liters.   ACTIVITY:  As tolerated.   FOLLOW-UP CARE:  Follow with PCP within 1 to 2 weeks.   REASON FOR ADMISSION:  Shortness of breath.   HOSPITAL COURSE:   1.  The patient is a 74 year old Caucasian male with a history of terminal COPD on 4 liters oxygen and presented to the ED with shortness of breath 4 to 5 days.  The patient was admitted for COPD exacerbation.  For a detailed history and physical examination, please refer to the admission note dictated by Dr. Anselm Jungling.  After admission, the patient has been treated with Solu-Medrol, nebulizer, Spiriva and continued home oxygen.  The patient's symptoms have much improved.  He has only mild cough and shortness of breath, but no wheezing.  No crackles.  The patient wants to go home today.  2.  Hypertension, has been controlled.  3.  Leukocytopenia.  The patient's WBC is at 2.7, but no evidence of infection or bleeding.  The patient should follow-up CBC with PCP as outpatient.  4.  Anemia, stable.  The patient is clinically stable, will be discharged to home today.  I discussed the patient's discharge plan with the patient and the patient's wife and the nurse.   TIME SPENT:  About 36 minutes.    ____________________________ Demetrios Loll, MD qc:ea D: 07/03/2013 16:38:20 ET T: 07/03/2013 17:01:56 ET JOB#: [id number]  cc: Demetrios Loll, MD,  <Dictator> Demetrios Loll MD ELECTRONICALLY SIGNED 07/04/2013 16:11

## 2014-10-22 NOTE — Discharge Summary (Signed)
PATIENT NAME:  Darin Lopez, Darin Lopez MR#:  [id number] DATE OF BIRTH:  [birth year]  DATE OF ADMISSION:  08/12/2013 DATE OF DISCHARGE:  08/13/2013  DISCHARGE DIAGNOSES:   1.  Chest pain secondary to coronary artery disease.  2.  End-stage chronic obstructive pulmonary disease, on 4 liters of oxygen at home along with chronic prednisone.  3.  Ulcerative colitis.  4.  Benign prostatic hypertrophy.  5.  Depression.  6.  Gastroesophageal reflux disease.   PRIMARY CARE PHYSICIAN: Dr. Clemmie Krill.  CONSULTATIONS: Cardiology consult with Dr. Neoma Laming.   DISCHARGE MEDICATIONS: 1.  Benadryl 25 mg p.o. daily. 2.  Neurontin 300 mg 2 capsules once a day.  3.  Mercaptopurine 50 mg p.o. daily.  4.  Omeprazole 40 mg p.o. daily.  5.  Pentasa 500 mg 2 capsules once a day.  6.  Prednisone 10 mg p.o. daily.  7.  Zoloft 50 mg p.o. daily.  8.  Albuterol 90 mcg 2 puffs 4 times daily.  9.  Xopenex 2 puffs once a day.  10.  Ambien 10 mg daily.  11.  Terazosin 10 mg p.o. daily.   These are all new medications: 1.  Imdur 30 mg p.o. daily (new).  2.  Plavix 75 mg p.o. daily (new medication).  3.  Simvastatin 40 mg p.o. daily (new medication). 4.  Toprol-XL 25 mg p.o. daily (also is a new medication).   Discharged home with 4 liters of oxygen that he uses at home.   HOSPITAL COURSE: Chest pain: This is a 74 year old male patient with history of end-stage COPD, came in because of chest pain. The patient had left-sided chest pain, 7 out of 10 in severity, with some trouble breathing. The patient was admitted to hospitalist service on telemetry. Initial vitals were  within normal limits. The patient's troponin (first one) was negative. EKG showed on admission accelerated junctional rhythm. The patient was started on aspirin and nitroglycerin and morphine. Beta blockers were not given because of his end-stage COPD. The patient had a CT angio chest to evaluate for PE, and the CT chest did not show any PE. The patient had a  small, less than 1 mm nodule in left lower lobe, and he needs to follow up for repeat CT in 3 to 6 months. Troponin (next one) went up to 0.19, and third one was 3. The patient was taken to cardiac cath by Dr. Neoma Laming yesterday, and it showed minimal coronary artery disease and medical management was advised. The patient's EF was normal, so Dr. Humphrey Rolls said the patient can continue aspirin, Plavix, Imdur, statins, and follow up with him in office on Monday, that is February 16, at 1:30 p.m. The patient did not have any further chest pain. Initially because of his troponin elevation, he was started on heparin drip, but the patient remained asymptomatic and then he was discharged home in stable condition. He will be on aspirin, Plavix, statins, Imdur, and beta blockers, and he will continue other medications regarding his ulcerative colitis and COPD. The patient's cardiac cath showed EF of 60% with distal LAD 60% stenosis and circumflex 50% stenosis and obtuse marginal 70% stenosis.   The patient's condition is stable at the time of discharge. Discharge vitals: Heart rate 73, blood pressure 100/40, and sats 94% on 4 liters.   TIME SPENT ON DISCHARGE PREPARATION: More than 30 minutes.   ____________________________ Epifanio Lesches, MD sk:jcm D: 08/13/2013 15:50:02 ET T: 08/13/2013 17:06:26 ET JOB#: [id number]  cc: Epifanio Lesches, MD, <  Dictator> Epifanio Lesches MD ELECTRONICALLY SIGNED 08/25/2013 15:29

## 2015-01-16 ENCOUNTER — Other Ambulatory Visit: Payer: Self-pay | Admitting: Otolaryngology

## 2015-01-16 DIAGNOSIS — R1310 Dysphagia, unspecified: Secondary | ICD-10-CM

## 2015-01-18 ENCOUNTER — Ambulatory Visit
Admission: RE | Admit: 2015-01-18 | Discharge: 2015-01-18 | Disposition: A | Discharge status: Home / Self Care | Payer: PPO | Source: Ambulatory Visit | Attending: Otolaryngology | Admitting: Otolaryngology

## 2015-01-18 DIAGNOSIS — R1310 Dysphagia, unspecified: Secondary | ICD-10-CM | POA: Diagnosis not present

## 2015-01-18 NOTE — Therapy (Addendum)
[place], [place], [address] Phone: [phone]   Fax:     Modified Barium Swallow  Patient Details  Name: Darin Lopez MRN: [id number] Date of Birth: 1940-10-18 Referring Provider:  Clyde Canterbury, MD  Encounter Date: 01/18/2015   Subjective: Patient behavior: (alertness, ability to follow instructions, etc.): pt A/O x3. Followed instruction appropriately. Pt denied any h/o CVA, reflux, or neurological deficits.  Chief complaint: pt stated he "got choked" while eating at a restaurant last week; stated he was eating meat when it happened. He denied any consistency to his problems swallowing and stated he can drink liquids "fine". Pt has U/L dentures.    Objective:  Radiological Procedure: A videoflouroscopic evaluation of oral-preparatory, reflex initiation, and pharyngeal phases of the swallow was performed; as well as a screening of the upper esophageal phase.  I. POSTURE: upright II. VIEW: lateral III. COMPENSATORY STRATEGIES: f/u, dry swallow - appeared to clear any (inconsistent) pharyngeal residue remaining IV. BOLUSES ADMINISTERED:  Thin Liquid: 5   Nectar-thick Liquid: 1  Honey-thick Liquid: NT  Puree: 3  Mechanical Soft: 2 V. RESULTS OF EVALUATION: A. ORAL PREPARATORY PHASE: (The lips, tongue, and velum are observed for strength and coordination)       **Overall Severity Rating: WFL. No deficits noted w/ trial consistencies given. A-P transfer time appropriate; oral clearing appropriate.  B. SWALLOW INITIATION/REFLEX: (The reflex is normal if "triggered" by the time the bolus reached the base of the tongue)  **Overall Severity Rating: grossly WFL. Pharyngeal swallow initiation appeared wfl for all consistencies assessed; no laryngeal penetration or aspiration occurred during swallowing.   C. PHARYNGEAL PHASE: (Pharyngeal function is normal if the bolus shows rapid, smooth, and continuous  transit through the pharynx and there is no pharyngeal residue after the swallow)  **Overall Severity Rating: grossly WFL. Pt exhibited trace-min. pharyngeal residue w/ trials consistencies (inconsistently); moreso in the valleculae w/ thin liquids. When pt used a f/u, dry swallow independently b/t trials, this appeared to clear the residue. No buildup of residue noted.   D. LARYNGEAL PENETRATION: (Material entering into the laryngeal inlet/vestibule but not aspirated): None  E. ASPIRATION: None F. ESOPHAGEAL PHASE: (Screening of the upper esophagus): No upper(cervical) Esophageal deficits noted - bolus material appeared to clear the upper Esophagus viewable during screening. However, more solid trials such as meats were not assessed.   ASSESSMENT:  Pt appeared to present w/ an adequate oropharyngeal phase swallow function; no laryngeal penetration or aspiration noted during this study indicating appropriate laryngeal excursion and pharyngeal pressure for airway closure and protection. Pt did exhibit trace-min. pharyngeal residue inconsistently w/ residue appearing in the valleculae moreso. When pt used a f/u, dry swallow independently b/t trials, he appeared to clear this residue; no buildup of residue noted during the eval. This presentation can be related to GERD effects; pt does have a h/o GERD. No oral phase deficits noted. Education and discussion w/ pt on eval and recommendations.  PLAN/RECOMMENDATIONS:  A. Diet: regular  B. Swallowing Precautions: general aspiration precautions; thorough mastication of meats/solids; small, single bites/sips - slowly   C. Recommended consultation to GI if any continued deficits are noted swallowing meats/breads(solids)  D. Therapy recommendations - none  E. Results and recommendations were discussed w/ pt; recs. given on Reflux precautions also. Pt gave verbal agreement      End of Session - 01/18/15 1551    Visit Number 1   Number of Visits 1  Date for  SLP Re-Evaluation 01-24-2015   SLP Start Time 73   SLP Stop Time  1400   SLP Time Calculation (min) 60 min   Activity Tolerance Patient tolerated treatment well      Past Medical History  Diagnosis Date  . COPD (chronic obstructive pulmonary disease)   . Emphysema of lung   . Colitis   . GERD (gastroesophageal reflux disease)   . Emphysema   . On home oxygen therapy   . Depression     Past Surgical History  Procedure Laterality Date  . Lung surgery      UPPER LOBE REMOVED FOR EMPHYSEMA  . Wrist surgery    . Knee surgery      LEFT BONE SPUR REMOVAL  . Eye surgery      BILATERAL  . Carpal tunnel release  10/12/2011    Procedure: CARPAL TUNNEL RELEASE;  Surgeon: Schuyler Amor, MD;  Location: [facility];  Service: Orthopedics;  Laterality: Right;    There were no vitals filed for this visit.  Visit Diagnosis: Dysphagia - Plan: DG OP Swallowing Func-Medicare/Speech Path, DG OP Swallowing Func-Medicare/Speech Path                               G-Codes - January 24, 2015 1552    Functional Assessment Tool Used clincial judgement; MBSS   Functional Limitations Swallowing   Swallow Current Status (V4944) At least 1 percent but less than 20 percent impaired, limited or restricted   Swallow Goal Status (H6759) At least 1 percent but less than 20 percent impaired, limited or restricted   Swallow Discharge Status [phone]) At least 1 percent but less than 20 percent impaired, limited or restricted          Problem List There are no active problems to display for this patient.  Orinda Kenner, MS, CCC-SLP Watson,Katherine 2015/01/24, 3:52 PM  [facility] [place], [place], [address] Phone: [phone]   Fax:

## 2015-03-30 ENCOUNTER — Other Ambulatory Visit: Payer: Self-pay

## 2015-03-30 ENCOUNTER — Ambulatory Visit (INDEPENDENT_AMBULATORY_CARE_PROVIDER_SITE_OTHER): Payer: PPO | Admitting: Gastroenterology

## 2015-03-30 ENCOUNTER — Encounter: Payer: Self-pay | Admitting: Gastroenterology

## 2015-03-30 VITALS — BP 122/68 | HR 80 | Temp 98.0°F | Ht 72.0 in | Wt 201.0 lb

## 2015-03-30 DIAGNOSIS — R1314 Dysphagia, pharyngoesophageal phase: Secondary | ICD-10-CM | POA: Diagnosis not present

## 2015-03-30 NOTE — Progress Notes (Signed)
Primary Care Physician: Lynnell Jude, MD  Primary Gastroenterologist:  Dr. Lucilla Lame  Chief Complaint  Patient presents with  . Dysphagia    HPI: Darin Lopez is a 74 y.o. male here for a history of reflux and dysphagia. The patient reports that he has dysphagia to solids more than liquids. There is no report of any unexplained weight loss, fevers, chills, nausea or vomiting. The patient also denies any black stools or bloody stools. He has no problems drinking liquids and states is only with solid foods. The patient had a modified barium swallow that did not show any abnormalities to explain his symptoms. He also denies any abdominal pain the patient does have chronic lung disease and is on home oxygen.  Current Outpatient Prescriptions  Medication Sig Dispense Refill  . B Complex Vitamins (B COMPLEX PO) Take 1 tablet by mouth daily.    . Budesonide (UCERIS) 9 MG TB24 Take by mouth.    . budesonide-formoterol (SYMBICORT) 160-4.5 MCG/ACT inhaler Inhale 2 puffs into the lungs 2 (two) times daily.    . diphenhydrAMINE (BENADRYL) 25 MG tablet Take 25 mg by mouth every evening.    . diphenoxylate-atropine (LOMOTIL) 2.5-0.025 MG per tablet Take 1 tablet by mouth 4 (four) times daily as needed. For diarrhea    . donepezil (ARICEPT) 5 MG tablet Take 5 mg by mouth at bedtime.    . gabapentin (NEURONTIN) 300 MG capsule Take 300 mg by mouth 2 (two) times daily.    Marland Kitchen HYDROcodone-acetaminophen (NORCO/VICODIN) 5-325 MG tablet Take 1 tablet by mouth every 6 (six) hours as needed for moderate pain.    . hydrocortisone (ANUSOL-HC) 2.5 % rectal cream Place 1 application rectally 2 (two) times daily.    . isosorbide dinitrate (ISORDIL) 30 MG tablet Take 30 mg by mouth 4 (four) times daily.    . mercaptopurine (PURINETHOL) 50 MG tablet Take 100 mg by mouth daily.    . mesalamine (PENTASA) 250 MG CR capsule Take 1,500 mg by mouth daily.    . metoprolol succinate (TOPROL-XL) 25 MG 24 hr tablet Take 25  mg by mouth daily.    . mometasone (ASMANEX 30 METERED DOSES) 220 MCG/INH inhaler Inhale 1 puff into the lungs 2 (two) times daily.    . nitroGLYCERIN (NITROSTAT) 0.4 MG SL tablet Place 0.4 mg under the tongue every 5 (five) minutes as needed for chest pain.    Marland Kitchen omeprazole (PRILOSEC) 20 MG capsule Take 20 mg by mouth daily.    . ondansetron (ZOFRAN) 4 MG tablet Take 4 mg by mouth every 8 (eight) hours as needed for nausea or vomiting.    . pravastatin (PRAVACHOL) 20 MG tablet Take 20 mg by mouth daily.    . predniSONE (DELTASONE) 10 MG tablet Take 10 mg by mouth daily.    . Probiotic Product (PROBIOTIC COMPLEX ACIDOPHILUS PO) Take 1 capsule by mouth daily.    . salmeterol (SEREVENT) 50 MCG/DOSE diskus inhaler Inhale 1 puff into the lungs daily.    . sertraline (ZOLOFT) 50 MG tablet Take 50 mg by mouth daily.    . simvastatin (ZOCOR) 40 MG tablet Take 40 mg by mouth daily.    Marland Kitchen sulfamethoxazole-trimethoprim (BACTRIM,SEPTRA) 400-80 MG per tablet Take 1 tablet by mouth daily.    Marland Kitchen terazosin (HYTRIN) 10 MG capsule Take 10 mg by mouth daily.    Marland Kitchen tiotropium (SPIRIVA) 18 MCG inhalation capsule Place 18 mcg into inhaler and inhale daily.    Marland Kitchen zolpidem (AMBIEN)  10 MG tablet Take 10 mg by mouth at bedtime.    Marland Kitchen CALCIUM CITRATE PO Take 1 tablet by mouth daily.    . Certolizumab Pegol (CIMZIA) 2 X 200 MG KIT Inject 400 mg into the skin every 14 (fourteen) days. 400 mg twice monthly    . psyllium (METAMUCIL) 58.6 % powder Take 1 packet by mouth daily.     No current facility-administered medications for this visit.    Allergies as of 03/30/2015 - Review Complete 03/30/2015  Allergen Reaction Noted  . Latex Rash 03/29/2015  . Adhesive [tape] Other (See Comments) 10/04/2011  . Aspirin Other (See Comments) 10/02/2011  . Flagyl [metronidazole hcl] Nausea And Vomiting 10/02/2011  . Sulfa antibiotics  03/29/2015    ROS:  General: Negative for anorexia, weight loss, fever, chills, fatigue,  weakness. ENT: Negative for hoarseness, difficulty swallowing , nasal congestion. CV: Negative for chest pain, angina, palpitations, dyspnea on exertion, peripheral edema.  Respiratory: Negative for dyspnea at rest, dyspnea on exertion, cough, sputum, wheezing.  GI: See history of present illness. GU:  Negative for dysuria, hematuria, urinary incontinence, urinary frequency, nocturnal urination.  Endo: Negative for unusual weight change.    Physical Examination:   BP 122/68 mmHg  Pulse 80  Temp(Src) 98 F (36.7 C) (Oral)  Ht 6' (1.829 m)  Wt 201 lb (91.173 kg)  BMI 27.25 kg/m2  General: Well-nourished, well-developed in no acute distress.  Eyes: No icterus. Conjunctivae pink. Mouth: Oropharyngeal mucosa moist and pink , no lesions erythema or exudate. Lungs: Diffuse rhonchi. Heart: Regular rate and rhythm, no murmurs rubs or gallops.  Abdomen: Bowel sounds are normal, nontender, nondistended, no hepatosplenomegaly or masses, no abdominal bruits or hernia , no rebound or guarding.   Extremities: No lower extremity edema. No clubbing or deformities. Neuro: Alert and oriented x 3.  Grossly intact. Skin: Warm and dry, no jaundice.   Psych: Alert and cooperative, normal mood and affect.  Labs:    Imaging Studies: No results found.  Assessment and Plan:   Darin Lopez is a 74 y.o. y/o male who has a history of dysphagia for the last two months. The patient says is mostly to solid and not the liquids. The patient will be set up for an upper endoscopy to evaluate his esophagus for possible stricture or lesion. I have discussed risks & benefits which include, but are not limited to, bleeding, infection, perforation & drug reaction.  The patient agrees with this plan & written consent will be obtained.      Note: This dictation was prepared with Dragon dictation along with smaller phrase technology. Any transcriptional errors that result from this process are unintentional.

## 2015-04-03 ENCOUNTER — Encounter: Payer: Self-pay | Admitting: Anesthesiology

## 2015-04-03 ENCOUNTER — Encounter: Payer: Self-pay | Admitting: *Deleted

## 2015-04-05 ENCOUNTER — Telehealth: Payer: Self-pay

## 2015-04-05 NOTE — Telephone Encounter (Signed)
LVM for pt to return my call to reschedule procedure to [facility] due to cardiac and breathing issues.

## 2015-04-07 ENCOUNTER — Other Ambulatory Visit: Payer: Self-pay

## 2015-04-07 NOTE — Telephone Encounter (Signed)
Spoke with Darin Lopez regarding rescheduling pt to [facility]. Pt has been added to 04/11/15 and given instructions for prep and phone number to the ENDO unit for arrival time.

## 2015-04-10 ENCOUNTER — Telehealth: Payer: Self-pay | Admitting: Gastroenterology

## 2015-04-10 ENCOUNTER — Ambulatory Visit: Admit: 2015-04-10 | Payer: PPO | Admitting: Gastroenterology

## 2015-04-10 HISTORY — DX: Sleep apnea, unspecified: G47.30

## 2015-04-10 HISTORY — DX: Presence of dental prosthetic device (complete) (partial): Z97.2

## 2015-04-10 HISTORY — DX: Cardiac murmur, unspecified: R01.1

## 2015-04-10 HISTORY — DX: Essential (primary) hypertension: I10

## 2015-04-10 HISTORY — DX: Reserved for inherently not codable concepts without codable children: IMO0001

## 2015-04-10 SURGERY — ESOPHAGOGASTRODUODENOSCOPY (EGD) WITH PROPOFOL
Anesthesia: Choice

## 2015-04-10 NOTE — Telephone Encounter (Signed)
Patients wife left voice message that they wanted to cancel his procedure. They wanted to see how things are going before they go ahead.

## 2015-04-11 ENCOUNTER — Encounter: Admission: RE | Payer: Self-pay | Source: Ambulatory Visit

## 2015-04-11 ENCOUNTER — Ambulatory Visit: Admission: RE | Admit: 2015-04-11 | Payer: PPO | Source: Ambulatory Visit | Admitting: Gastroenterology

## 2015-04-11 SURGERY — ESOPHAGOGASTRODUODENOSCOPY (EGD) WITH PROPOFOL
Anesthesia: General

## 2015-04-19 ENCOUNTER — Other Ambulatory Visit: Payer: Self-pay

## 2015-05-16 ENCOUNTER — Ambulatory Visit
Admission: RE | Admit: 2015-05-16 | Discharge: 2015-05-16 | Disposition: A | Discharge status: Home / Self Care | Payer: PPO | Source: Ambulatory Visit | Attending: Gastroenterology | Admitting: Gastroenterology

## 2015-05-16 ENCOUNTER — Ambulatory Visit: Payer: PPO | Admitting: Anesthesiology

## 2015-05-16 ENCOUNTER — Encounter
Admission: RE | Disposition: A | Discharge status: Home / Self Care | Payer: Self-pay | Source: Ambulatory Visit | Attending: Gastroenterology

## 2015-05-16 DIAGNOSIS — Z882 Allergy status to sulfonamides status: Secondary | ICD-10-CM | POA: Diagnosis not present

## 2015-05-16 DIAGNOSIS — F329 Major depressive disorder, single episode, unspecified: Secondary | ICD-10-CM | POA: Insufficient documentation

## 2015-05-16 DIAGNOSIS — K222 Esophageal obstruction: Secondary | ICD-10-CM | POA: Insufficient documentation

## 2015-05-16 DIAGNOSIS — J449 Chronic obstructive pulmonary disease, unspecified: Secondary | ICD-10-CM | POA: Insufficient documentation

## 2015-05-16 DIAGNOSIS — Z886 Allergy status to analgesic agent status: Secondary | ICD-10-CM | POA: Insufficient documentation

## 2015-05-16 DIAGNOSIS — I10 Essential (primary) hypertension: Secondary | ICD-10-CM | POA: Diagnosis not present

## 2015-05-16 DIAGNOSIS — G4730 Sleep apnea, unspecified: Secondary | ICD-10-CM | POA: Insufficient documentation

## 2015-05-16 DIAGNOSIS — Z7952 Long term (current) use of systemic steroids: Secondary | ICD-10-CM | POA: Diagnosis not present

## 2015-05-16 DIAGNOSIS — Z9109 Other allergy status, other than to drugs and biological substances: Secondary | ICD-10-CM | POA: Diagnosis not present

## 2015-05-16 DIAGNOSIS — Z9981 Dependence on supplemental oxygen: Secondary | ICD-10-CM | POA: Diagnosis not present

## 2015-05-16 DIAGNOSIS — Z8711 Personal history of peptic ulcer disease: Secondary | ICD-10-CM | POA: Diagnosis not present

## 2015-05-16 DIAGNOSIS — K219 Gastro-esophageal reflux disease without esophagitis: Secondary | ICD-10-CM | POA: Diagnosis not present

## 2015-05-16 DIAGNOSIS — Z87891 Personal history of nicotine dependence: Secondary | ICD-10-CM | POA: Insufficient documentation

## 2015-05-16 DIAGNOSIS — R1310 Dysphagia, unspecified: Secondary | ICD-10-CM | POA: Insufficient documentation

## 2015-05-16 DIAGNOSIS — K449 Diaphragmatic hernia without obstruction or gangrene: Secondary | ICD-10-CM | POA: Diagnosis not present

## 2015-05-16 DIAGNOSIS — Z79899 Other long term (current) drug therapy: Secondary | ICD-10-CM | POA: Diagnosis not present

## 2015-05-16 DIAGNOSIS — K317 Polyp of stomach and duodenum: Secondary | ICD-10-CM | POA: Diagnosis not present

## 2015-05-16 HISTORY — PX: ESOPHAGOGASTRODUODENOSCOPY (EGD) WITH PROPOFOL: SHX5813

## 2015-05-16 SURGERY — ESOPHAGOGASTRODUODENOSCOPY (EGD) WITH PROPOFOL
Anesthesia: General

## 2015-05-16 MED ORDER — MIDAZOLAM HCL 2 MG/2ML IJ SOLN
INTRAMUSCULAR | Status: DC | PRN
  Administered 2015-05-16: 1 mg via INTRAVENOUS

## 2015-05-16 MED ORDER — SODIUM CHLORIDE 0.9 % IV SOLN
INTRAVENOUS | Status: DC
  Administered 2015-05-16 (×2): via INTRAVENOUS

## 2015-05-16 MED ORDER — SODIUM CHLORIDE 0.9 % IV SOLN: INTRAVENOUS | Status: DC

## 2015-05-16 MED ORDER — PROPOFOL 10 MG/ML IV BOLUS
INTRAVENOUS | Status: DC | PRN
  Administered 2015-05-16: 40 mg via INTRAVENOUS
  Administered 2015-05-16 (×2): 30 mg via INTRAVENOUS

## 2015-05-16 MED ORDER — LIDOCAINE HCL (CARDIAC) 20 MG/ML IV SOLN
INTRAVENOUS | Status: DC | PRN
Start: 2015-05-16 — End: 2015-05-16
  Administered 2015-05-16: 60 mg via INTRAVENOUS

## 2015-05-16 MED ORDER — EPHEDRINE SULFATE 50 MG/ML IJ SOLN
INTRAMUSCULAR | Status: DC | PRN
  Administered 2015-05-16: 5 mg via INTRAVENOUS

## 2015-05-16 MED ORDER — PHENYLEPHRINE HCL 10 MG/ML IJ SOLN
INTRAMUSCULAR | Status: DC | PRN
  Administered 2015-05-16 (×2): 200 ug via INTRAVENOUS

## 2015-05-16 NOTE — H&P (Signed)
[facility]  [address], [place], [place] [address] Phone: [phone] Fax : [phone]  Primary Care Physician:  Lynnell Jude, MD Primary Gastroenterologist:  Dr. Allen Norris  Pre-Procedure History & Physical: HPI:  Darin Lopez is a 74 y.o. male is here for an endoscopy.   Past Medical History  Diagnosis Date  . COPD (chronic obstructive pulmonary disease) ([place])   . Emphysema of lung ([place])   . Colitis   . GERD (gastroesophageal reflux disease)   . Emphysema   . On home oxygen therapy     4L - continuous  . Depression   . Shortness of breath dyspnea   . Hypertension     Hx - denies current issues  . Sleep apnea     CPAP  . Wears dentures     full upper and lower  . Heart murmur     "all my life"    Past Surgical History  Procedure Laterality Date  . Lung surgery      UPPER LOBE REMOVED FOR EMPHYSEMA  . Wrist surgery    . Knee surgery      LEFT BONE SPUR REMOVAL  . Eye surgery      BILATERAL  . Carpal tunnel release  10/12/2011    Procedure: CARPAL TUNNEL RELEASE;  Surgeon: Schuyler Amor, MD;  Location: [facility];  Service: Orthopedics;  Laterality: Right;  . Cataract extraction      Prior to Admission medications   Medication Sig Start Date End Date Taking? Authorizing Provider  isosorbide dinitrate (ISORDIL) 30 MG tablet Take 30 mg by mouth 4 (four) times daily.   Yes Historical Provider, MD  metoprolol succinate (TOPROL-XL) 25 MG 24 hr tablet Take 25 mg by mouth daily.   Yes Historical Provider, MD  omeprazole (PRILOSEC) 20 MG capsule Take 20 mg by mouth daily.   Yes Historical Provider, MD  B Complex Vitamins (B COMPLEX PO) Take 1 tablet by mouth daily.    Historical Provider, MD  Budesonide (UCERIS) 9 MG TB24 Take by mouth.    Historical Provider, MD  budesonide-formoterol (SYMBICORT) 160-4.5 MCG/ACT inhaler Inhale 2 puffs into the lungs 2 (two) times daily.    Historical Provider, MD  CALCIUM CITRATE PO Take 1 tablet by mouth daily.     Historical Provider, MD  Certolizumab Pegol (CIMZIA) 2 X 200 MG KIT Inject 400 mg into the skin every 14 (fourteen) days. 400 mg twice monthly    Historical Provider, MD  Coenzyme Q10 (CO Q-10 PO) Take by mouth.    Historical Provider, MD  diphenhydrAMINE (BENADRYL) 25 MG tablet Take 25 mg by mouth every evening.    Historical Provider, MD  diphenoxylate-atropine (LOMOTIL) 2.5-0.025 MG per tablet Take 1 tablet by mouth 4 (four) times daily as needed. For diarrhea    Historical Provider, MD  donepezil (ARICEPT) 5 MG tablet Take 5 mg by mouth at bedtime.    Historical Provider, MD  gabapentin (NEURONTIN) 300 MG capsule Take 300 mg by mouth 2 (two) times daily.    Historical Provider, MD  HYDROcodone-acetaminophen (NORCO/VICODIN) 5-325 MG tablet Take 1 tablet by mouth every 6 (six) hours as needed for moderate pain.    Historical Provider, MD  hydrocortisone (ANUSOL-HC) 2.5 % rectal cream Place 1 application rectally 2 (two) times daily.    Historical Provider, MD  mercaptopurine (PURINETHOL) 50 MG tablet Take 100 mg by mouth daily.    Historical Provider, MD  mesalamine (PENTASA) 250 MG CR capsule  Take 1,500 mg by mouth daily.    Historical Provider, MD  mometasone (ASMANEX 30 METERED DOSES) 220 MCG/INH inhaler Inhale 1 puff into the lungs 2 (two) times daily.    Historical Provider, MD  Multiple Vitamin (MULTIVITAMIN) tablet Take 1 tablet by mouth daily.    Historical Provider, MD  nitroGLYCERIN (NITROSTAT) 0.4 MG SL tablet Place 0.4 mg under the tongue every 5 (five) minutes as needed for chest pain.    Historical Provider, MD  ondansetron (ZOFRAN) 4 MG tablet Take 4 mg by mouth every 8 (eight) hours as needed for nausea or vomiting.    Historical Provider, MD  OXYGEN Inhale 4 L into the lungs continuous.    Historical Provider, MD  pravastatin (PRAVACHOL) 20 MG tablet Take 20 mg by mouth daily.    Historical Provider, MD  predniSONE (DELTASONE) 10 MG tablet Take 10 mg by mouth daily.     Historical Provider, MD  Probiotic Product (PROBIOTIC COMPLEX ACIDOPHILUS PO) Take 1 capsule by mouth daily.    Historical Provider, MD  psyllium (METAMUCIL) 58.6 % powder Take 1 packet by mouth daily.    Historical Provider, MD  salmeterol (SEREVENT) 50 MCG/DOSE diskus inhaler Inhale 1 puff into the lungs daily.    Historical Provider, MD  sertraline (ZOLOFT) 50 MG tablet Take 50 mg by mouth daily.    Historical Provider, MD  simvastatin (ZOCOR) 40 MG tablet Take 40 mg by mouth daily.    Historical Provider, MD  sulfamethoxazole-trimethoprim (BACTRIM,SEPTRA) 400-80 MG per tablet Take 1 tablet by mouth daily.    Historical Provider, MD  terazosin (HYTRIN) 10 MG capsule Take 10 mg by mouth daily.    Historical Provider, MD  tiotropium (SPIRIVA) 18 MCG inhalation capsule Place 18 mcg into inhaler and inhale daily.    Historical Provider, MD  zolpidem (AMBIEN) 10 MG tablet Take 10 mg by mouth at bedtime.    Historical Provider, MD    Allergies as of 04/19/2015 - Review Complete 04/03/2015  Allergen Reaction Noted  . Adhesive [tape] Other (See Comments) 10/04/2011  . Aspirin Other (See Comments) 10/02/2011  . Flagyl [metronidazole hcl] Nausea And Vomiting 10/02/2011  . Sulfa antibiotics  03/29/2015    Family History  Problem Relation Age of Onset  . Alzheimer's disease Mother   . Macular degeneration Mother   . Heart attack Father     Social History   Social History  . Marital Status: Widowed    Spouse Name: N/A  . Number of Children: N/A  . Years of Education: N/A   Occupational History  . Not on file.   Social History Main Topics  . Smoking status: Former Smoker    Types: Cigarettes  . Smokeless tobacco: Never Used     Comment: Quit over 35 yrs ago  . Alcohol Use: No  . Drug Use: No  . Sexual Activity: Not on file   Other Topics Concern  . Not on file   Social History Narrative    Review of Systems: See HPI, otherwise negative ROS  Physical Exam: BP 156/91 mmHg   Pulse 44  Temp(Src) 96.6 F (35.9 C) (Tympanic)  Resp 16  SpO2 97% General:   Alert,  pleasant and cooperative in NAD Head:  Normocephalic and atraumatic. Neck:  Supple; no masses or thyromegaly. Lungs:  Clear throughout to auscultation.    Heart:  Regular rate and rhythm. Abdomen:  Soft, nontender and nondistended. Normal bowel sounds, without guarding, and without rebound.   Neurologic:  Alert and  oriented x4;  grossly normal neurologically.  Impression/Plan: Darin Lopez is here for an endoscopy to be performed for dysphagia  Risks, benefits, limitations, and alternatives regarding  endoscopy have been reviewed with the patient.  Questions have been answered.  All parties agreeable.   Ollen Bowl, MD  05/16/2015, 9:44 AM

## 2015-05-16 NOTE — Op Note (Signed)
[facility] Patient Name: Darin Lopez Procedure Date: 05/16/2015 9:40 AM MRN: [id number] Account #: [id number] Date of Birth: [birth year] Admit Type: Outpatient Age: 74 Room: [facility] ENDO ROOM 4 Gender: Male Note Status: Finalized Procedure:         Upper GI endoscopy Indications:       Dysphagia Providers:         Lucilla Lame, MD Referring MD:      Reyes Ivan, MD (Referring MD) Medicines:         Propofol per Anesthesia Complications:     No immediate complications. Procedure:         Pre-Anesthesia Assessment:                    - Prior to the procedure, a History and Physical was                     performed, and patient medications and allergies were                     reviewed. The patient's tolerance of previous anesthesia                     was also reviewed. The risks and benefits of the procedure                     and the sedation options and risks were discussed with the                     patient. All questions were answered, and informed consent                     was obtained. Prior Anticoagulants: The patient has taken                     no previous anticoagulant or antiplatelet agents. ASA                     Grade Assessment: II - A patient with mild systemic                     disease. After reviewing the risks and benefits, the                     patient was deemed in satisfactory condition to undergo                     the procedure.                    After obtaining informed consent, the endoscope was passed                     under direct vision. Throughout the procedure, the                     patient's blood pressure, pulse, and oxygen saturations                     were monitored continuously. The Endoscope was introduced                     through the mouth, and advanced to the second part of  duodenum. The upper GI endoscopy was accomplished without                     difficulty. The  patient tolerated the procedure well. Findings:      A large hiatus hernia was present.      One mild benign-appearing, intrinsic stenosis was found at the       gastroesophageal junction. And was traversed. A TTS dilator was passed       through the scope. Dilation with a 15-16.5-18 mm balloon (to a maximum       balloon size of 18 mm) dilator was performed.      The stomach was normal.      A single 10 mm sessile polyp with no bleeding was found in the first       part of the duodenum. This was biopsied with a cold forceps for       histology. Impression:        - Large hiatus hernia.                    - Benign-appearing esophageal stenosis. Dilated.                    - Normal stomach.                    - A single duodenal polyp. Biopsied. Recommendation:    - Await pathology results. Procedure Code(s): --- Professional ---                    [phone], Esophagogastroduodenoscopy, flexible, transoral;                     with transendoscopic balloon dilation of esophagus (less                     than 30 mm diameter)                    43239, Esophagogastroduodenoscopy, flexible, transoral;                     with biopsy, single or multiple Diagnosis Code(s): --- Professional ---                    R13.10, Dysphagia, unspecified                    K22.2, Esophageal obstruction                    K31.7, Polyp of stomach and duodenum CPT copyright 2014 [employer] Association. All rights reserved. The codes documented in this report are preliminary and upon coder review may  be revised to meet current compliance requirements. Lucilla Lame, MD 05/16/2015 9:59:47 AM This report has been signed electronically. Number of Addenda: 0 Note Initiated On: 05/16/2015 9:40 AM      [facility]

## 2015-05-16 NOTE — Anesthesia Postprocedure Evaluation (Signed)
  Anesthesia Post-op Note  Patient: Darin Lopez  Procedure(s) Performed: Procedure(s): ESOPHAGOGASTRODUODENOSCOPY (EGD) WITH PROPOFOL (N/A)  Anesthesia type:General  Patient location: PACU  Post pain: Pain level controlled  Post assessment: Post-op Vital signs reviewed, Patient's Cardiovascular Status Stable, Respiratory Function Stable, Patent Airway and No signs of Nausea or vomiting  Post vital signs: Reviewed and stable  Last Vitals:  Filed Vitals:   05/16/15 1006  BP: 107/66  Pulse: 87  Temp: 36.1 C  Resp: 16    Level of consciousness: awake, alert  and patient cooperative  Complications: No apparent anesthesia complications

## 2015-05-16 NOTE — Anesthesia Preprocedure Evaluation (Signed)
Anesthesia Evaluation  Patient identified by MRN, date of birth, ID band Patient awake    Reviewed: Allergy & Precautions, NPO status   Airway Mallampati: III       Dental  (+) Upper Dentures, Lower Dentures   Pulmonary COPD, former smoker,    + rhonchi  + decreased breath sounds      Cardiovascular Exercise Tolerance: Good hypertension, Pt. on medications and Pt. on home beta blockers  Rhythm:Regular Rate:Normal     Neuro/Psych    GI/Hepatic Neg liver ROS, PUD, GERD  Medicated,  Endo/Other    Renal/GU negative Renal ROS     Musculoskeletal negative musculoskeletal ROS (+)   Abdominal Normal abdominal exam  (+)   Peds  Hematology negative hematology ROS (+)   Anesthesia Other Findings   Reproductive/Obstetrics                             Anesthesia Physical Anesthesia Plan  ASA: III  Anesthesia Plan: General   Post-op Pain Management:    Induction: Intravenous  Airway Management Planned: Nasal Cannula  Additional Equipment:   Intra-op Plan:   Post-operative Plan:   Informed Consent: I have reviewed the patients History and Physical, chart, labs and discussed the procedure including the risks, benefits and alternatives for the proposed anesthesia with the patient or authorized representative who has indicated his/her understanding and acceptance.     Plan Discussed with: CRNA  Anesthesia Plan Comments:         Anesthesia Quick Evaluation

## 2015-05-16 NOTE — Transfer of Care (Signed)
Immediate Anesthesia Transfer of Care Note  Patient: Darin Lopez  Procedure(s) Performed: Procedure(s): ESOPHAGOGASTRODUODENOSCOPY (EGD) WITH PROPOFOL (N/A)  Patient Location: Endoscopy Unit  Anesthesia Type:General  Level of Consciousness: awake, alert , oriented and patient cooperative  Airway & Oxygen Therapy: Patient Spontanous Breathing and Patient connected to nasal cannula oxygen  Post-op Assessment: Report given to RN, Post -op Vital signs reviewed and stable and Patient moving all extremities X 4  Post vital signs: Reviewed and stable  Last Vitals:  Filed Vitals:   05/16/15 0932  BP: 156/91  Pulse: 44  Temp: 35.9 C  Resp: 16    Complications: No apparent anesthesia complications

## 2015-05-17 ENCOUNTER — Encounter: Payer: Self-pay | Admitting: Gastroenterology

## 2015-05-17 LAB — SURGICAL PATHOLOGY

## 2015-05-18 ENCOUNTER — Encounter: Payer: Self-pay | Admitting: Gastroenterology

## 2015-07-04 DIAGNOSIS — J449 Chronic obstructive pulmonary disease, unspecified: Secondary | ICD-10-CM | POA: Diagnosis not present

## 2015-07-12 DIAGNOSIS — K51919 Ulcerative colitis, unspecified with unspecified complications: Secondary | ICD-10-CM | POA: Diagnosis not present

## 2015-07-27 DIAGNOSIS — H353211 Exudative age-related macular degeneration, right eye, with active choroidal neovascularization: Secondary | ICD-10-CM | POA: Diagnosis not present

## 2015-08-01 DIAGNOSIS — J069 Acute upper respiratory infection, unspecified: Secondary | ICD-10-CM | POA: Diagnosis not present

## 2015-08-03 DIAGNOSIS — J069 Acute upper respiratory infection, unspecified: Secondary | ICD-10-CM | POA: Diagnosis not present

## 2015-08-04 DIAGNOSIS — J449 Chronic obstructive pulmonary disease, unspecified: Secondary | ICD-10-CM | POA: Diagnosis not present

## 2015-08-24 DIAGNOSIS — J441 Chronic obstructive pulmonary disease with (acute) exacerbation: Secondary | ICD-10-CM | POA: Diagnosis not present

## 2015-08-24 DIAGNOSIS — F321 Major depressive disorder, single episode, moderate: Secondary | ICD-10-CM | POA: Diagnosis not present

## 2015-09-01 DIAGNOSIS — J449 Chronic obstructive pulmonary disease, unspecified: Secondary | ICD-10-CM | POA: Diagnosis not present

## 2015-09-01 DIAGNOSIS — R05 Cough: Secondary | ICD-10-CM | POA: Diagnosis not present

## 2015-09-01 DIAGNOSIS — R140 Abdominal distension (gaseous): Secondary | ICD-10-CM | POA: Diagnosis not present

## 2015-09-06 ENCOUNTER — Emergency Department: Payer: PPO

## 2015-09-06 ENCOUNTER — Encounter: Payer: Self-pay | Admitting: *Deleted

## 2015-09-06 ENCOUNTER — Inpatient Hospital Stay
Admission: EM | Admit: 2015-09-06 | Discharge: 2015-09-11 | DRG: 190 | Disposition: A | Discharge status: Home / Self Care | Payer: PPO | Attending: Internal Medicine | Admitting: Internal Medicine

## 2015-09-06 DIAGNOSIS — G4730 Sleep apnea, unspecified: Secondary | ICD-10-CM | POA: Diagnosis not present

## 2015-09-06 DIAGNOSIS — I10 Essential (primary) hypertension: Secondary | ICD-10-CM | POA: Diagnosis present

## 2015-09-06 DIAGNOSIS — Z66 Do not resuscitate: Secondary | ICD-10-CM | POA: Diagnosis present

## 2015-09-06 DIAGNOSIS — I2510 Atherosclerotic heart disease of native coronary artery without angina pectoris: Secondary | ICD-10-CM | POA: Diagnosis present

## 2015-09-06 DIAGNOSIS — Z87891 Personal history of nicotine dependence: Secondary | ICD-10-CM

## 2015-09-06 DIAGNOSIS — Z888 Allergy status to other drugs, medicaments and biological substances status: Secondary | ICD-10-CM | POA: Diagnosis not present

## 2015-09-06 DIAGNOSIS — J449 Chronic obstructive pulmonary disease, unspecified: Secondary | ICD-10-CM | POA: Diagnosis not present

## 2015-09-06 DIAGNOSIS — K219 Gastro-esophageal reflux disease without esophagitis: Secondary | ICD-10-CM | POA: Diagnosis not present

## 2015-09-06 DIAGNOSIS — J9610 Chronic respiratory failure, unspecified whether with hypoxia or hypercapnia: Secondary | ICD-10-CM | POA: Diagnosis present

## 2015-09-06 DIAGNOSIS — R011 Cardiac murmur, unspecified: Secondary | ICD-10-CM | POA: Diagnosis not present

## 2015-09-06 DIAGNOSIS — J440 Chronic obstructive pulmonary disease with acute lower respiratory infection: Principal | ICD-10-CM | POA: Diagnosis present

## 2015-09-06 DIAGNOSIS — Z882 Allergy status to sulfonamides status: Secondary | ICD-10-CM | POA: Diagnosis not present

## 2015-09-06 DIAGNOSIS — Z8249 Family history of ischemic heart disease and other diseases of the circulatory system: Secondary | ICD-10-CM | POA: Diagnosis not present

## 2015-09-06 DIAGNOSIS — Z9889 Other specified postprocedural states: Secondary | ICD-10-CM

## 2015-09-06 DIAGNOSIS — Z820 Family history of epilepsy and other diseases of the nervous system: Secondary | ICD-10-CM

## 2015-09-06 DIAGNOSIS — Z79899 Other long term (current) drug therapy: Secondary | ICD-10-CM

## 2015-09-06 DIAGNOSIS — Z7952 Long term (current) use of systemic steroids: Secondary | ICD-10-CM

## 2015-09-06 DIAGNOSIS — N400 Enlarged prostate without lower urinary tract symptoms: Secondary | ICD-10-CM | POA: Diagnosis present

## 2015-09-06 DIAGNOSIS — Z9981 Dependence on supplemental oxygen: Secondary | ICD-10-CM

## 2015-09-06 DIAGNOSIS — J441 Chronic obstructive pulmonary disease with (acute) exacerbation: Secondary | ICD-10-CM | POA: Diagnosis present

## 2015-09-06 DIAGNOSIS — Z9849 Cataract extraction status, unspecified eye: Secondary | ICD-10-CM

## 2015-09-06 DIAGNOSIS — E785 Hyperlipidemia, unspecified: Secondary | ICD-10-CM | POA: Diagnosis present

## 2015-09-06 DIAGNOSIS — R000 Tachycardia, unspecified: Secondary | ICD-10-CM | POA: Diagnosis present

## 2015-09-06 DIAGNOSIS — J155 Pneumonia due to Escherichia coli: Secondary | ICD-10-CM | POA: Diagnosis not present

## 2015-09-06 DIAGNOSIS — R05 Cough: Secondary | ICD-10-CM | POA: Diagnosis not present

## 2015-09-06 DIAGNOSIS — J189 Pneumonia, unspecified organism: Secondary | ICD-10-CM | POA: Diagnosis not present

## 2015-09-06 DIAGNOSIS — J181 Lobar pneumonia, unspecified organism: Secondary | ICD-10-CM | POA: Diagnosis not present

## 2015-09-06 LAB — CBC
HEMATOCRIT: 41 % (ref 40.0–52.0)
Hemoglobin: 13.5 g/dL (ref 13.0–18.0)
MCH: 29.9 pg (ref 26.0–34.0)
MCHC: 32.8 g/dL (ref 32.0–36.0)
MCV: 91.3 fL (ref 80.0–100.0)
PLATELETS: 298 10*3/uL (ref 150–440)
RBC: 4.49 MIL/uL (ref 4.40–5.90)
RDW: 15 % — AB (ref 11.5–14.5)
WBC: 7.1 10*3/uL (ref 3.8–10.6)

## 2015-09-06 LAB — BRAIN NATRIURETIC PEPTIDE: B Natriuretic Peptide: 42 pg/mL (ref 0.0–100.0)

## 2015-09-06 LAB — BASIC METABOLIC PANEL
Anion gap: 10 (ref 5–15)
BUN: 14 mg/dL (ref 6–20)
CHLORIDE: 106 mmol/L (ref 101–111)
CO2: 25 mmol/L (ref 22–32)
CREATININE: 0.98 mg/dL (ref 0.61–1.24)
Calcium: 9.8 mg/dL (ref 8.9–10.3)
GFR calc Af Amer: >60 mL/min (ref >60)
GFR calc non Af Amer: >60 mL/min (ref >60)
GLUCOSE: 106 mg/dL — AB (ref 65–99)
POTASSIUM: 4.8 mmol/L (ref 3.5–5.1)
Sodium: 141 mmol/L (ref 135–145)

## 2015-09-06 LAB — TROPONIN I: Troponin I: <0.03 ng/mL (ref <0.031)

## 2015-09-06 MED ORDER — ENOXAPARIN SODIUM 40 MG/0.4ML ~~LOC~~ SOLN
40.0000 mg | SUBCUTANEOUS | Status: DC
  Administered 2015-09-06 – 2015-09-10 (×5): 40 mg via SUBCUTANEOUS
  Filled 2015-09-06 (×5): qty 0.4

## 2015-09-06 MED ORDER — BUDESONIDE 3 MG PO CPEP
9.0000 mg | ORAL_CAPSULE | Freq: Every day | ORAL | Status: DC
  Administered 2015-09-07 – 2015-09-11 (×5): 9 mg via ORAL
  Filled 2015-09-06 (×6): qty 3

## 2015-09-06 MED ORDER — DIPHENHYDRAMINE HCL 25 MG PO TABS: 25.0000 mg | ORAL_TABLET | Freq: Every evening | ORAL | Status: DC | PRN

## 2015-09-06 MED ORDER — VANCOMYCIN HCL IN DEXTROSE 1-5 GM/200ML-% IV SOLN
1000.0000 mg | Freq: Once | INTRAVENOUS | Status: AC
  Administered 2015-09-06: 1000 mg via INTRAVENOUS
  Filled 2015-09-06: qty 200

## 2015-09-06 MED ORDER — SERTRALINE HCL 100 MG PO TABS
100.0000 mg | ORAL_TABLET | Freq: Every day | ORAL | Status: DC
  Administered 2015-09-07 – 2015-09-11 (×5): 100 mg via ORAL
  Filled 2015-09-06 (×5): qty 1

## 2015-09-06 MED ORDER — PIPERACILLIN-TAZOBACTAM 3.375 G IVPB 30 MIN
3.3750 g | Freq: Once | INTRAVENOUS | Status: AC
  Administered 2015-09-06: 3.375 g via INTRAVENOUS
  Filled 2015-09-06: qty 50

## 2015-09-06 MED ORDER — IPRATROPIUM-ALBUTEROL 0.5-2.5 (3) MG/3ML IN SOLN
3.0000 mL | Freq: Four times a day (QID) | RESPIRATORY_TRACT | Status: DC
  Administered 2015-09-06 – 2015-09-09 (×12): 3 mL via RESPIRATORY_TRACT
  Filled 2015-09-06 (×12): qty 3

## 2015-09-06 MED ORDER — IPRATROPIUM-ALBUTEROL 0.5-2.5 (3) MG/3ML IN SOLN
3.0000 mL | Freq: Once | RESPIRATORY_TRACT | Status: AC
  Administered 2015-09-06: 3 mL via RESPIRATORY_TRACT
  Filled 2015-09-06: qty 3

## 2015-09-06 MED ORDER — PANTOPRAZOLE SODIUM 40 MG PO TBEC
40.0000 mg | DELAYED_RELEASE_TABLET | Freq: Every day | ORAL | Status: DC
Start: 2015-09-06 — End: 2015-09-11
  Administered 2015-09-06 – 2015-09-11 (×6): 40 mg via ORAL
  Filled 2015-09-06 (×6): qty 1

## 2015-09-06 MED ORDER — METHYLPREDNISOLONE SODIUM SUCC 125 MG IJ SOLR
125.0000 mg | Freq: Once | INTRAMUSCULAR | Status: AC
  Administered 2015-09-06: 125 mg via INTRAVENOUS
  Filled 2015-09-06: qty 2

## 2015-09-06 MED ORDER — ZOLPIDEM TARTRATE 5 MG PO TABS
5.0000 mg | ORAL_TABLET | Freq: Every evening | ORAL | Status: DC | PRN
  Administered 2015-09-06: 5 mg via ORAL
  Filled 2015-09-06 (×2): qty 1

## 2015-09-06 MED ORDER — SODIUM CHLORIDE 0.9% FLUSH
3.0000 mL | Freq: Two times a day (BID) | INTRAVENOUS | Status: DC
  Administered 2015-09-06 – 2015-09-11 (×9): 3 mL via INTRAVENOUS

## 2015-09-06 MED ORDER — PREDNISONE 20 MG PO TABS
10.0000 mg | ORAL_TABLET | Freq: Every day | ORAL | Status: DC
  Administered 2015-09-07: 10 mg via ORAL
  Filled 2015-09-06: qty 1

## 2015-09-06 MED ORDER — SODIUM CHLORIDE 0.9 % IV SOLN: 250.0000 mL | INTRAVENOUS | Status: DC | PRN

## 2015-09-06 MED ORDER — NITROGLYCERIN 0.4 MG SL SUBL: 0.4000 mg | SUBLINGUAL_TABLET | SUBLINGUAL | Status: DC | PRN

## 2015-09-06 MED ORDER — GABAPENTIN 300 MG PO CAPS
300.0000 mg | ORAL_CAPSULE | Freq: Every day | ORAL | Status: DC
  Administered 2015-09-06 – 2015-09-10 (×5): 300 mg via ORAL
  Filled 2015-09-06 (×5): qty 1

## 2015-09-06 MED ORDER — ARFORMOTEROL TARTRATE 15 MCG/2ML IN NEBU
15.0000 ug | INHALATION_SOLUTION | Freq: Two times a day (BID) | RESPIRATORY_TRACT | Status: DC
  Administered 2015-09-07 – 2015-09-10 (×8): 15 ug via RESPIRATORY_TRACT
  Filled 2015-09-06 (×12): qty 2

## 2015-09-06 MED ORDER — PRAVASTATIN SODIUM 20 MG PO TABS
20.0000 mg | ORAL_TABLET | Freq: Every day | ORAL | Status: DC
  Administered 2015-09-06 – 2015-09-10 (×5): 20 mg via ORAL
  Filled 2015-09-06 (×5): qty 1

## 2015-09-06 MED ORDER — MOMETASONE FURO-FORMOTEROL FUM 200-5 MCG/ACT IN AERO
2.0000 | INHALATION_SPRAY | Freq: Two times a day (BID) | RESPIRATORY_TRACT | Status: DC
  Administered 2015-09-06 – 2015-09-11 (×10): 2 via RESPIRATORY_TRACT
  Filled 2015-09-06: qty 8.8

## 2015-09-06 MED ORDER — LEVOFLOXACIN IN D5W 750 MG/150ML IV SOLN
750.0000 mg | INTRAVENOUS | Status: DC
  Administered 2015-09-06 – 2015-09-07 (×2): 750 mg via INTRAVENOUS
  Filled 2015-09-06 (×3): qty 150

## 2015-09-06 MED ORDER — SODIUM CHLORIDE 0.9% FLUSH: 3.0000 mL | INTRAVENOUS | Status: DC | PRN

## 2015-09-06 MED ORDER — RISAQUAD PO CAPS
1.0000 | ORAL_CAPSULE | Freq: Every day | ORAL | Status: DC
  Administered 2015-09-07 – 2015-09-11 (×5): 1 via ORAL
  Filled 2015-09-06 (×5): qty 1

## 2015-09-06 MED ORDER — TERAZOSIN HCL 5 MG PO CAPS
20.0000 mg | ORAL_CAPSULE | Freq: Every day | ORAL | Status: DC
Start: 2015-09-06 — End: 2015-09-11
  Administered 2015-09-06 – 2015-09-10 (×5): 20 mg via ORAL
  Filled 2015-09-06 (×6): qty 4

## 2015-09-06 MED ORDER — ZOLPIDEM TARTRATE 5 MG PO TABS: 5.0000 mg | ORAL_TABLET | Freq: Every evening | ORAL | Status: DC | PRN

## 2015-09-06 MED ORDER — MERCAPTOPURINE 50 MG PO TABS
100.0000 mg | ORAL_TABLET | Freq: Every day | ORAL | Status: DC
  Administered 2015-09-07 – 2015-09-11 (×5): 100 mg via ORAL
  Filled 2015-09-06 (×6): qty 2

## 2015-09-06 MED ORDER — GUAIFENESIN 100 MG/5ML PO SOLN
5.0000 mL | ORAL | Status: DC | PRN
  Administered 2015-09-06 – 2015-09-11 (×5): 100 mg via ORAL
  Filled 2015-09-06 (×5): qty 10

## 2015-09-06 NOTE — ED Notes (Signed)
Attempted to call report. Receiving RN in report at this time. Gave number and waiting call from receiving RN.

## 2015-09-06 NOTE — ED Notes (Signed)
Pt at XR

## 2015-09-06 NOTE — H&P (Signed)
[facility] [place] [facility]   PATIENT NAME: Darin Lopez    MR#:  [id number]  DATE OF BIRTH:  01/22/1941  DATE OF ADMISSION:  09/06/2015  PRIMARY CARE PHYSICIAN: Dortha Kern, MD   REQUESTING/REFERRING PHYSICIAN: Jennye Moccasin, MD  CHIEF COMPLAINT:   Chief Complaint  Patient presents with  . Shortness of Breath  . Cough  . Chest Pain   Cough with yellow sputum and shortness of breath for the past week. HISTORY OF PRESENT ILLNESS:  Darin Lopez  is a 75 y.o. male with a known history of COPD with emphysema, chronic respiratory failure on home oxygen 4 L and GERD. The patient has had protracted cough with yellow sputum for one week. He also complains of shortness of breath, wheezing and chest tightness. He denies any fever or chills, no leg swelling, nocturnal dyspnea or orthopnea. He has been on by mouth antibiotics for 6 days. He is on chronic prednisone 10 mg daily. Chest x-ray show right lower lobe pneumonia. He was treated with vancomycin and Zosyn in the ED.  PAST MEDICAL HISTORY:   Past Medical History  Diagnosis Date  . COPD (chronic obstructive pulmonary disease) (HCC)   . Emphysema of lung (HCC)   . Colitis   . GERD (gastroesophageal reflux disease)   . Emphysema   . On home oxygen therapy     4L - continuous  . Depression   . Shortness of breath dyspnea   . Hypertension     Hx - denies current issues  . Sleep apnea     CPAP  . Wears dentures     full upper and lower  . Heart murmur     "all my life"    PAST SURGICAL HISTORY:   Past Surgical History  Procedure Laterality Date  . Lung surgery      UPPER LOBE REMOVED FOR EMPHYSEMA  . Wrist surgery    . Knee surgery      LEFT BONE SPUR REMOVAL  . Eye surgery      BILATERAL  . Carpal tunnel release  10/12/2011    Procedure: CARPAL TUNNEL RELEASE;  Surgeon: Marlowe Shores, MD;  Location: [facility] OR;  Service: Orthopedics;  Laterality: Right;  . Cataract extraction    .  Esophagogastroduodenoscopy (egd) with propofol N/A 05/16/2015    Procedure: ESOPHAGOGASTRODUODENOSCOPY (EGD) WITH PROPOFOL;  Surgeon: Midge Minium, MD;  Location: [facility] ENDOSCOPY;  Service: Endoscopy;  Laterality: N/A;    SOCIAL HISTORY:   Social History  Substance Use Topics  . Smoking status: Former Smoker    Types: Cigarettes  . Smokeless tobacco: Never Used     Comment: Quit over 35 yrs ago  . Alcohol Use: No    FAMILY HISTORY:   Family History  Problem Relation Age of Onset  . Alzheimer's disease Mother   . Macular degeneration Mother   . Heart attack Father     DRUG ALLERGIES:   Allergies  Allergen Reactions  . Adhesive [Tape] Other (See Comments)    SKIN RASH  . Aspirin Other (See Comments)    GI Bleed  . Flagyl [Metronidazole Hcl] Nausea And Vomiting  . Sulfa Antibiotics     REVIEW OF SYSTEMS:  CONSTITUTIONAL: No fever, has generalized weakness but good appetite.  EYES: No blurred or double vision.  EARS, NOSE, AND THROAT: No tinnitus or ear pain.  RESPIRATORY: Has cough, shortness of breath, wheezing but no hemoptysis.  CARDIOVASCULAR: No chest pain, orthopnea,  edema.  GASTROINTESTINAL: No nausea, vomiting, diarrhea or abdominal pain.  GENITOURINARY: No dysuria, hematuria.  ENDOCRINE: No polyuria, nocturia,  HEMATOLOGY: No anemia, easy bruising or bleeding SKIN: No rash or lesion. MUSCULOSKELETAL: No joint pain or arthritis.   NEUROLOGIC: No tingling, numbness, weakness.  PSYCHIATRY: No anxiety or depression.   MEDICATIONS AT HOME:   Prior to Admission medications   Medication Sig Start Date End Date Taking? Authorizing Provider  B Complex Vitamins (B COMPLEX PO) Take 1 tablet by mouth daily.    Historical Provider, MD  Budesonide (UCERIS) 9 MG TB24 Take by mouth.    Historical Provider, MD  budesonide-formoterol (SYMBICORT) 160-4.5 MCG/ACT inhaler Inhale 2 puffs into the lungs 2 (two) times daily.    Historical Provider, MD  CALCIUM CITRATE PO Take 1  tablet by mouth daily.    Historical Provider, MD  Certolizumab Pegol (CIMZIA) 2 X 200 MG KIT Inject 400 mg into the skin every 14 (fourteen) days. 400 mg twice monthly    Historical Provider, MD  Coenzyme Q10 (CO Q-10 PO) Take by mouth.    Historical Provider, MD  diphenhydrAMINE (BENADRYL) 25 MG tablet Take 25 mg by mouth every evening.    Historical Provider, MD  diphenoxylate-atropine (LOMOTIL) 2.5-0.025 MG per tablet Take 1 tablet by mouth 4 (four) times daily as needed. For diarrhea    Historical Provider, MD  donepezil (ARICEPT) 5 MG tablet Take 5 mg by mouth at bedtime.    Historical Provider, MD  gabapentin (NEURONTIN) 300 MG capsule Take 300 mg by mouth 2 (two) times daily.    Historical Provider, MD  HYDROcodone-acetaminophen (NORCO/VICODIN) 5-325 MG tablet Take 1 tablet by mouth every 6 (six) hours as needed for moderate pain.    Historical Provider, MD  hydrocortisone (ANUSOL-HC) 2.5 % rectal cream Place 1 application rectally 2 (two) times daily.    Historical Provider, MD  isosorbide dinitrate (ISORDIL) 30 MG tablet Take 30 mg by mouth 4 (four) times daily.    Historical Provider, MD  mercaptopurine (PURINETHOL) 50 MG tablet Take 100 mg by mouth daily.    Historical Provider, MD  mesalamine (PENTASA) 250 MG CR capsule Take 1,500 mg by mouth daily.    Historical Provider, MD  metoprolol succinate (TOPROL-XL) 25 MG 24 hr tablet Take 25 mg by mouth daily.    Historical Provider, MD  mometasone (ASMANEX 30 METERED DOSES) 220 MCG/INH inhaler Inhale 1 puff into the lungs 2 (two) times daily.    Historical Provider, MD  Multiple Vitamin (MULTIVITAMIN) tablet Take 1 tablet by mouth daily.    Historical Provider, MD  nitroGLYCERIN (NITROSTAT) 0.4 MG SL tablet Place 0.4 mg under the tongue every 5 (five) minutes as needed for chest pain.    Historical Provider, MD  omeprazole (PRILOSEC) 20 MG capsule Take 20 mg by mouth daily.    Historical Provider, MD  ondansetron (ZOFRAN) 4 MG tablet Take 4  mg by mouth every 8 (eight) hours as needed for nausea or vomiting.    Historical Provider, MD  OXYGEN Inhale 4 L into the lungs continuous.    Historical Provider, MD  pravastatin (PRAVACHOL) 20 MG tablet Take 20 mg by mouth daily.    Historical Provider, MD  predniSONE (DELTASONE) 10 MG tablet Take 10 mg by mouth daily.    Historical Provider, MD  Probiotic Product (PROBIOTIC COMPLEX ACIDOPHILUS PO) Take 1 capsule by mouth daily.    Historical Provider, MD  psyllium (METAMUCIL) 58.6 % powder Take 1 packet by mouth  daily.    Historical Provider, MD  salmeterol (SEREVENT) 50 MCG/DOSE diskus inhaler Inhale 1 puff into the lungs daily.    Historical Provider, MD  sertraline (ZOLOFT) 50 MG tablet Take 50 mg by mouth daily.    Historical Provider, MD  simvastatin (ZOCOR) 40 MG tablet Take 40 mg by mouth daily.    Historical Provider, MD  sulfamethoxazole-trimethoprim (BACTRIM,SEPTRA) 400-80 MG per tablet Take 1 tablet by mouth daily.    Historical Provider, MD  terazosin (HYTRIN) 10 MG capsule Take 10 mg by mouth daily.    Historical Provider, MD  tiotropium (SPIRIVA) 18 MCG inhalation capsule Place 18 mcg into inhaler and inhale daily.    Historical Provider, MD  zolpidem (AMBIEN) 10 MG tablet Take 10 mg by mouth at bedtime.    Historical Provider, MD      VITAL SIGNS:  Blood pressure 140/91, pulse 95, temperature 97.7 F (36.5 C), temperature source Oral, resp. rate 19, height _0  (1.854 m), weight 89.359 kg (197 lb), SpO2 99 %.  PHYSICAL EXAMINATION:  GENERAL:  75 y.o.-year-old patient lying in the bed with no acute distress.  EYES: Pupils equal, round, reactive to light and accommodation. No scleral icterus. Extraocular muscles intact.  HEENT: Head atraumatic, normocephalic. Oropharynx and nasopharynx clear.  NECK:  Supple, no jugular venous distention. No thyroid enlargement, no tenderness.  LUNGS: Normal breath sounds bilaterally, no wheezing, rales,rhonchi or crepitation. No use of  accessory muscles of respiration.  CARDIOVASCULAR: S1, S2 tachycardia. No murmurs, rubs, or gallops.  ABDOMEN: Soft, nontender, nondistended. Bowel sounds present. No organomegaly or mass.  EXTREMITIES: No pedal edema, cyanosis, or clubbing.  NEUROLOGIC: Cranial nerves II through XII are intact. Muscle strength 5/5 in all extremities. Sensation intact. Gait not checked.  PSYCHIATRIC: The patient is alert and oriented x 3.  SKIN: No obvious rash, lesion, or ulcer.   LABORATORY PANEL:   CBC  Recent Labs Lab 09/06/15 1513  WBC 7.1  HGB 13.5  HCT 41.0  PLT 298   ------------------------------------------------------------------------------------------------------------------  Chemistries   Recent Labs Lab 09/06/15 1513  NA 141  K 4.8  CL 106  CO2 25  GLUCOSE 106*  BUN 14  CREATININE 0.98  CALCIUM 9.8   ------------------------------------------------------------------------------------------------------------------  Cardiac Enzymes  Recent Labs Lab 09/06/15 1513  TROPONINI <0.03   ------------------------------------------------------------------------------------------------------------------  RADIOLOGY:  Dg Chest 2 View  09/06/2015  CLINICAL DATA:  Short of breath and chest pain.  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 Right lower lobe infiltrate is a new finding and consistent with pneumonia. No effusion. Mild scarring in both lung bases. Right middle lobe scarring. IMPRESSION: Right lower lobe pneumonia. Severe COPD with apical emphysema bilaterally. Electronically Signed   By: Franchot Gallo M.D.   On: 09/06/2015 16:15    EKG:   Orders placed or performed during the hospital encounter of 09/06/15  . ED EKG within 10 minutes  . ED EKG within 10 minutes  . EKG 12-Lead  . EKG 12-Lead    IMPRESSION AND PLAN:   Right lower lobe  pneumonia (CAP) Started Levaquin IV, continue DuoNeb and Symbicort.Follow-up blood culture and sputum culture.   Chronic respiratory failure with COPD with emphysema Continue nebulizer treatment and oxygen by nasal cannula 4 L.   Hypertension. Continue hypertension medication.  Sleep apnea. BiPAP when necessary at night.  All the records are reviewed and case discussed with ED provider.  Management plans discussed with the patient,  his wife and they are in agreement.  CODE STATUS: DO NOT RESUSCITATE  TOTAL TIME TAKING CARE OF THIS PATIENT: 56 minutes.    Demetrios Loll M.D on 09/06/2015 at 5:18 PM  Between 7am to 6pm - Pager - [phone]  After 6pm go to [email or web address] - password [email or web address] [facility]  Office  [phone]  CC: Primary care physician; Lynnell Jude, MD

## 2015-09-06 NOTE — ED Provider Notes (Signed)
Time Seen: Approximately 1511 I have reviewed the triage notes  Chief Complaint: Shortness of Breath; Cough; and Chest Pain   History of Present Illness: Darin Lopez is a 75 y.o. male who has a long history of COPD, emphysema with home O2 oxygen dependence at 4 L. Patient's had a cough that has been productive with some yellow tinged sputum now for the past week. He did see his primary physician and was started on a course of Levaquin. He states he's developed some increasing shortness of breath and his cough has some associated chest discomfort with it. He does have a history of coronary artery disease. He states this pain is different from his previous heart discomfort. He still describes his chest pain as being sharp transient and associated with coughing. He now also has some nighttime BiPAP at home. He is currently on chronic steroids with prednisone 10 mg a day. Besides the Levaquin which was started recently the patient was also established on pro-air.  Past Medical History  Diagnosis Date  . COPD (chronic obstructive pulmonary disease) ([place])   . Emphysema of lung ([place])   . Colitis   . GERD (gastroesophageal reflux disease)   . Emphysema   . On home oxygen therapy     4L - continuous  . Depression   . Shortness of breath dyspnea   . Hypertension     Hx - denies current issues  . Sleep apnea     CPAP  . Wears dentures     full upper and lower  . Heart murmur     "all my life"    Patient Active Problem List   Diagnosis Date Noted  . Difficulty in swallowing   . Stricture and stenosis of esophagus   . Polyp of stomach and duodenum   . Actinic keratoses 08/17/2014  . H/O malignant neoplasm of skin 08/17/2014  . 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 Colitis gravis ([place]) 10/23/2012  . 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 Dizziness 01/15/2012  . Has a tremor 01/15/2012     Past Surgical History  Procedure Laterality Date  . Lung surgery      UPPER LOBE REMOVED FOR EMPHYSEMA  . Wrist surgery    . Knee surgery      LEFT BONE SPUR REMOVAL  . Eye surgery      BILATERAL  . Carpal tunnel release  10/12/2011    Procedure: CARPAL TUNNEL RELEASE;  Surgeon: Schuyler Amor, MD;  Location: [place];  Service: Orthopedics;  Laterality: Right;  . Cataract extraction    . Esophagogastroduodenoscopy (egd) with propofol N/A 05/16/2015    Procedure: ESOPHAGOGASTRODUODENOSCOPY (EGD) WITH PROPOFOL;  Surgeon: Lucilla Lame, MD;  Location: [facility] ENDOSCOPY;  Service: Endoscopy;  Laterality: N/A;    Past Surgical History  Procedure Laterality Date  . Lung surgery      UPPER LOBE REMOVED FOR EMPHYSEMA  . Wrist surgery    . Knee surgery      LEFT BONE SPUR REMOVAL  . Eye surgery      BILATERAL  . Carpal tunnel release  10/12/2011    Procedure: CARPAL TUNNEL RELEASE;  Surgeon: Schuyler Amor, MD;  Location: [place];  Service: Orthopedics;  Laterality: Right;  . Cataract extraction    . Esophagogastroduodenoscopy (egd) with propofol N/A 05/16/2015    Procedure: ESOPHAGOGASTRODUODENOSCOPY (EGD) WITH PROPOFOL;  Surgeon: Lucilla Lame, MD;  Location: [facility] ENDOSCOPY;  Service: Endoscopy;  Laterality: N/A;    Current Outpatient Rx  Name  Route  Sig  Dispense  Refill  . B Complex Vitamins (B COMPLEX PO)   Oral   Take 1 tablet by mouth daily.         . Budesonide (UCERIS) 9 MG TB24   Oral   Take by mouth.         . budesonide-formoterol (SYMBICORT) 160-4.5 MCG/ACT inhaler   Inhalation   Inhale 2 puffs into the lungs 2 (two) times daily.         Marland Kitchen CALCIUM CITRATE PO   Oral   Take 1 tablet by mouth daily.         . Certolizumab Pegol (CIMZIA) 2 X 200 MG KIT   Subcutaneous   Inject 400 mg into the skin every 14 (fourteen) days. 400 mg twice monthly         . Coenzyme Q10 (CO Q-10 PO)   Oral   Take by mouth.         . diphenhydrAMINE (BENADRYL) 25 MG  tablet   Oral   Take 25 mg by mouth every evening.         . diphenoxylate-atropine (LOMOTIL) 2.5-0.025 MG per tablet   Oral   Take 1 tablet by mouth 4 (four) times daily as needed. For diarrhea         . donepezil (ARICEPT) 5 MG tablet   Oral   Take 5 mg by mouth at bedtime.         . gabapentin (NEURONTIN) 300 MG capsule   Oral   Take 300 mg by mouth 2 (two) times daily.         Marland Kitchen HYDROcodone-acetaminophen (NORCO/VICODIN) 5-325 MG tablet   Oral   Take 1 tablet by mouth every 6 (six) hours as needed for moderate pain.         . hydrocortisone (ANUSOL-HC) 2.5 % rectal cream   Rectal   Place 1 application rectally 2 (two) times daily.         . isosorbide dinitrate (ISORDIL) 30 MG tablet   Oral   Take 30 mg by mouth 4 (four) times daily.         . mercaptopurine (PURINETHOL) 50 MG tablet   Oral   Take 100 mg by mouth daily.         . mesalamine (PENTASA) 250 MG CR capsule   Oral   Take 1,500 mg by mouth daily.         . metoprolol succinate (TOPROL-XL) 25 MG 24 hr tablet   Oral   Take 25 mg by mouth daily.         . mometasone (ASMANEX 30 METERED DOSES) 220 MCG/INH inhaler   Inhalation   Inhale 1 puff into the lungs 2 (two) times daily.         . Multiple Vitamin (MULTIVITAMIN) tablet   Oral   Take 1 tablet by mouth daily.         . nitroGLYCERIN (NITROSTAT) 0.4 MG SL tablet   Sublingual   Place 0.4 mg under the tongue every 5 (five) minutes as needed for chest pain.         Marland Kitchen omeprazole (PRILOSEC) 20 MG capsule   Oral   Take 20 mg by mouth daily.         . ondansetron (ZOFRAN) 4 MG tablet   Oral   Take 4 mg by mouth every 8 (eight) hours as  needed for nausea or vomiting.         . OXYGEN   Inhalation   Inhale 4 L into the lungs continuous.         . pravastatin (PRAVACHOL) 20 MG tablet   Oral   Take 20 mg by mouth daily.         . predniSONE (DELTASONE) 10 MG tablet   Oral   Take 10 mg by mouth daily.          . Probiotic Product (PROBIOTIC COMPLEX ACIDOPHILUS PO)   Oral   Take 1 capsule by mouth daily.         . psyllium (METAMUCIL) 58.6 % powder   Oral   Take 1 packet by mouth daily.         . salmeterol (SEREVENT) 50 MCG/DOSE diskus inhaler   Inhalation   Inhale 1 puff into the lungs daily.         . sertraline (ZOLOFT) 50 MG tablet   Oral   Take 50 mg by mouth daily.         . simvastatin (ZOCOR) 40 MG tablet   Oral   Take 40 mg by mouth daily.         Marland Kitchen sulfamethoxazole-trimethoprim (BACTRIM,SEPTRA) 400-80 MG per tablet   Oral   Take 1 tablet by mouth daily.         Marland Kitchen terazosin (HYTRIN) 10 MG capsule   Oral   Take 10 mg by mouth daily.         Marland Kitchen tiotropium (SPIRIVA) 18 MCG inhalation capsule   Inhalation   Place 18 mcg into inhaler and inhale daily.         Marland Kitchen zolpidem (AMBIEN) 10 MG tablet   Oral   Take 10 mg by mouth at bedtime.           Allergies:  Adhesive; Aspirin; Flagyl; and Sulfa antibiotics  Family History: Family History  Problem Relation Age of Onset  . Alzheimer's disease Mother   . Macular degeneration Mother   . Heart attack Father     Social History: Social History  Substance Use Topics  . Smoking status: Former Smoker    Types: Cigarettes  . Smokeless tobacco: Never Used     Comment: Quit over 35 yrs ago  . Alcohol Use: No     Review of Systems:   10 point review of systems was performed and was otherwise negative:  Constitutional: No fever Eyes: No visual disturbances ENT: No sore throat, ear pain Cardiac: Bilateral sharp transient anterior chest discomfort without arm job back or flank pain Respiratory: Increasing shortness of breath especially with any exertion. Abdomen: No abdominal pain, no vomiting, No diarrhea Endocrine: No weight loss, No night sweats Extremities: No peripheral edema, cyanosis Skin: No rashes, easy bruising Neurologic: No focal weakness, trouble with speech or swollowing Urologic: No  dysuria, Hematuria, or urinary frequency   Physical Exam:  ED Triage Vitals  Enc Vitals Group     BP 09/06/15 1456 144/88 mmHg     Pulse Rate 09/06/15 1456 97     Resp 09/06/15 1456 24     Temp 09/06/15 1456 97.7 F (36.5 C)     Temp Source 09/06/15 1456 Oral     SpO2 09/06/15 1456 92 %     Weight 09/06/15 1456 200 lb (90.719 kg)     Height 09/06/15 1456 '6\' 1"'  (1.854 m)     Head Cir --  Peak Flow --      Pain Score 09/06/15 1456 5     Pain Loc --      Pain Edu? --      Excl. in [place]? --     General: Awake , Alert , and Oriented times 3; GCS 15. Patient's tachypnea with some mild upper respiratory retractions with a wet sounding cough at the bedside. No audible wheezing. Head: Normal cephalic , atraumatic Eyes: Pupils equal , round, reactive to light Nose/Throat: No nasal drainage, patent upper airway without erythema or exudate.  Neck: Supple, Full range of motion, No anterior adenopathy or palpable thyroid masses Lungs: Patient has coarse breath sounds auscultated bilaterally mostly over the central chest region. Mild and expiratory wheezing auscultated at the apices and no Rales. Heart: Regular rate, regular rhythm without murmurs , gallops , or rubs Abdomen: Soft, non tender without rebound, guarding , or rigidity; bowel sounds positive and symmetric in all 4 quadrants. No organomegaly .        Extremities: 2 plus symmetric pulses. No edema, clubbing or cyanosis Neurologic: normal ambulation, Motor symmetric without deficits, sensory intact Skin: warm, dry, no rashes   Labs:   All laboratory work was reviewed including any pertinent negatives or positives listed below:  Labs Reviewed  [place]    EKG:  ED ECG REPORT I, Daymon Larsen, the attending physician, personally viewed and interpreted this ECG.  Date: 09/06/2015 EKG Time: 1507 Rate: 98 Rhythm: normal sinus rhythm QRS Axis: normal Intervals:  normal ST/T Wave abnormalities: normal Conduction Disturbances: none Narrative Interpretation: unremarkable Poor R-wave progression noticed in the anterior leads No ischemic changes     Radiology: *     CLINICAL DATA: Short of breath and chest pain. Cough  EXAM: CHEST 2 VIEW  COMPARISON: 08/11/2013  FINDINGS: Severe COPD with apical emphysema and hyperinflation. Prior upper lobectomy bilaterally with clips noted.  Right lower lobe infiltrate is a new finding and consistent with pneumonia. No effusion. Mild scarring in both lung bases. Right middle lobe scarring.  IMPRESSION: Right lower lobe pneumonia.  Severe COPD with apical emphysema bilaterally.       I personally reviewed the radiologic studies    ED Course: Patient's stay here was uneventful and he does not have any hypoxia. Given his history of COPD he was given a DuoNeb and some IV Solu-Medrol. Patient underwent further investigation O he is afebrile he appears to have a right lower lobe pneumonia. Concern is that he's been on 6 days of Levaquin and still either developed a pneumonia or it still present. Given his history of COPD and emphysema fell required further inpatient observation. Patient has blood cultures 2 ordered along with Zosyn and vancomycin IV    Assessment: Community-acquired right lower lobe pneumonia COPD Oxygen dependent     Plan: * Inpatient management I spoke to the hospitalist team, further disposition and management depends upon their evaluation           Daymon Larsen, MD 09/06/15 1717

## 2015-09-06 NOTE — ED Notes (Signed)
Pt to ED from PCP with cough, congestion, SOB and chest pain. Pt currently being treated with PO Levaquin for URI with no resolve. Upon arrival pt sating 94% on home O2 4L [place]. Pt AAOx4, states slight chest pain centralized. Vitals stable, NAD noted.

## 2015-09-06 NOTE — ED Notes (Signed)
MD Quigley at bedside. 

## 2015-09-07 ENCOUNTER — Encounter: Payer: Self-pay | Admitting: *Deleted

## 2015-09-07 DIAGNOSIS — J9610 Chronic respiratory failure, unspecified whether with hypoxia or hypercapnia: Secondary | ICD-10-CM | POA: Diagnosis not present

## 2015-09-07 DIAGNOSIS — J181 Lobar pneumonia, unspecified organism: Secondary | ICD-10-CM | POA: Diagnosis not present

## 2015-09-07 DIAGNOSIS — J449 Chronic obstructive pulmonary disease, unspecified: Secondary | ICD-10-CM | POA: Diagnosis not present

## 2015-09-07 LAB — EXPECTORATED SPUTUM ASSESSMENT W REFEX TO RESP CULTURE

## 2015-09-07 LAB — EXPECTORATED SPUTUM ASSESSMENT W GRAM STAIN, RFLX TO RESP C

## 2015-09-07 MED ORDER — VITAMIN B-1 100 MG PO TABS: 100.0000 mg | ORAL_TABLET | Freq: Every day | ORAL | Status: DC

## 2015-09-07 MED ORDER — THIAMINE HCL 100 MG/ML IJ SOLN: 100.0000 mg | Freq: Every day | INTRAMUSCULAR | Status: DC

## 2015-09-07 MED ORDER — LORAZEPAM 1 MG PO TABS: 1.0000 mg | ORAL_TABLET | Freq: Four times a day (QID) | ORAL | Status: DC | PRN

## 2015-09-07 MED ORDER — LORAZEPAM 2 MG/ML IJ SOLN: 1.0000 mg | Freq: Four times a day (QID) | INTRAMUSCULAR | Status: DC | PRN

## 2015-09-07 MED ORDER — LORAZEPAM 2 MG PO TABS: 0.0000 mg | ORAL_TABLET | Freq: Two times a day (BID) | ORAL | Status: DC

## 2015-09-07 MED ORDER — METHYLPREDNISOLONE SODIUM SUCC 40 MG IJ SOLR
40.0000 mg | Freq: Two times a day (BID) | INTRAMUSCULAR | Status: DC
  Administered 2015-09-07 – 2015-09-10 (×7): 40 mg via INTRAVENOUS
  Filled 2015-09-07 (×7): qty 1

## 2015-09-07 MED ORDER — FOLIC ACID 1 MG PO TABS: 1.0000 mg | ORAL_TABLET | Freq: Every day | ORAL | Status: DC

## 2015-09-07 MED ORDER — LORAZEPAM 2 MG PO TABS: 0.0000 mg | ORAL_TABLET | Freq: Four times a day (QID) | ORAL | Status: DC

## 2015-09-07 MED ORDER — ADULT MULTIVITAMIN W/MINERALS CH: 1.0000 | ORAL_TABLET | Freq: Every day | ORAL | Status: DC

## 2015-09-07 NOTE — Consult Note (Signed)
WOC wound consult note Reason for Consult:Abrasion to left anterior pretibial leg from fall at home a week ago.  Resolving without difficulty.  Wound type:Trauma Pressure Ulcer POA:N/A Measurement:4.5 cm x 0.2 cm linear abrasion.  Pink and newly epithelialized.  No further treatment at this time.  Wound [id number] Drainage (amount, consistency, odor) None Periwound:Intact Dressing procedure/placement/frequency:None at this time. Keep clean and dry.  Will not follow at this time.  Please re-consult if needed.  Domenic Moras RN BSN [place] Pager [phone]

## 2015-09-07 NOTE — Plan of Care (Addendum)
RN received call from tele that pt SPO2 reading 86%.  RN rounded on pt and noted pt had just returned from restoom and was heavily coughing.  During admission of this note pt was able to calm down and SPO2 returned to baseline.

## 2015-09-07 NOTE — Care Management (Signed)
Chronic O2 4 liters per RN at home. No current RNCM needs. Patient independent with cane per RN.

## 2015-09-07 NOTE — Progress Notes (Signed)
[place] at [place] NAME: Darin Lopez    MR#:  [id number]  DATE OF BIRTH:  1941/06/12  SUBJECTIVE:  Patient reports that he is feeling better since admission however still with wheezing and cough  REVIEW OF SYSTEMS:    Review of Systems  Constitutional: Negative for fever, chills and malaise/fatigue.  HENT: Negative for sore throat.   Eyes: Negative for blurred vision.  Respiratory: Positive for cough, shortness of breath and wheezing. Negative for hemoptysis.   Cardiovascular: Negative for chest pain, palpitations and leg swelling.  Gastrointestinal: Negative for nausea, vomiting, abdominal pain, diarrhea and blood in stool.  Genitourinary: Negative for dysuria.  Musculoskeletal: Negative for back pain.  Neurological: Positive for weakness. Negative for dizziness, tremors and headaches.  Endo/Heme/Allergies: Does not bruise/bleed easily.    Tolerating Diet:Yes      DRUG ALLERGIES:   Allergies  Allergen Reactions  . Aspirin Other (See Comments)    Reaction: GI bleed   . Flagyl [Metronidazole Hcl] Nausea And Vomiting  . Adhesive [Tape] Rash    VITALS:  Blood pressure 122/71, pulse 97, temperature 97.7 F (36.5 C), temperature source Oral, resp. rate 20, height 6\' 1"  (1.854 m), weight 89.359 kg (197 lb), SpO2 95 %.  PHYSICAL EXAMINATION:   Physical Exam  Constitutional: He is oriented to person, place, and time and well-developed, well-nourished, and in no distress. No distress.  HENT:  Head: Normocephalic.  Eyes: No scleral icterus.  Neck: Normal range of motion. Neck supple. No JVD present. No tracheal deviation present.  Cardiovascular: Normal rate, regular rhythm and normal heart sounds.  Exam reveals no gallop and no friction rub.   No murmur heard. Pulmonary/Chest: Effort normal. No respiratory distress. He has wheezes (tight). He has no rales. He exhibits no tenderness.  Abdominal: Soft. Bowel sounds are  normal. He exhibits no distension and no mass. There is no tenderness. There is no rebound and no guarding.  Musculoskeletal: Normal range of motion. He exhibits no edema.  Neurological: He is alert and oriented to person, place, and time.  Skin: Skin is warm. No rash noted. No erythema.  Psychiatric: Affect and judgment normal.      LABORATORY PANEL:   CBC  Recent Labs Lab 09/06/15 1513  WBC 7.1  HGB 13.5  HCT 41.0  PLT 298   ------------------------------------------------------------------------------------------------------------------  Chemistries   Recent Labs Lab 09/06/15 1513  NA 141  K 4.8  CL 106  CO2 25  GLUCOSE 106*  BUN 14  CREATININE 0.98  CALCIUM 9.8   ------------------------------------------------------------------------------------------------------------------  Cardiac Enzymes  Recent Labs Lab 09/06/15 1513  TROPONINI <0.03   ------------------------------------------------------------------------------------------------------------------  RADIOLOGY:  Dg Chest 2 View  09/06/2015  CLINICAL DATA:  Short of breath and chest pain.  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 Right lower lobe infiltrate is a new finding and consistent with pneumonia. No effusion. Mild scarring in both lung bases. Right middle lobe scarring. IMPRESSION: Right lower lobe pneumonia. Severe COPD with apical emphysema bilaterally. Electronically Signed   By: Franchot Gallo M.D.   On: 09/06/2015 16:15     ASSESSMENT AND PLAN:   75 year old male with a history of COPD and chronic respiratory failure on 4-5 L of oxygen who presents with shortness of breath and found to have community-acquired pneumonia.   1. Community-acquired pneumonia: Continue Levaquin. Follow up on blood cultures  Follow up on urine strep pneumonia  2. Chronic respiratory  failure with COPD exacerbation: Patient will need IV steroids. He is on chronic prednisone at home. Continue nebulizers and inhalers. Continue 4-5 L of oxygen which patient is on at home 3. Hyperlipidemia: Continue pravastatin.  4. BPH: Continue HYTRIM      Management plans discussed with the patient and he is in agreement.  CODE STATUS: DNR  TOTAL TIME TAKING CARE OF THIS PATIENT: 30 minutes.     POSSIBLE D/C 2-3 days, DEPENDING ON CLINICAL CONDITION.   Akilah Cureton M.D on 09/07/2015 at 11:10 AM  Between 7am to 6pm - Pager - [phone] After 6pm go to [email or web address] - password [email or web address] [facility]  Office  [phone]  CC: Primary care physician; Lynnell Jude, MD  Note: This dictation was prepared with Dragon dictation along with smaller phrase technology. Any transcriptional errors that result from this process are unintentional.

## 2015-09-08 DIAGNOSIS — J449 Chronic obstructive pulmonary disease, unspecified: Secondary | ICD-10-CM | POA: Diagnosis not present

## 2015-09-08 DIAGNOSIS — J9610 Chronic respiratory failure, unspecified whether with hypoxia or hypercapnia: Secondary | ICD-10-CM | POA: Diagnosis not present

## 2015-09-08 DIAGNOSIS — J181 Lobar pneumonia, unspecified organism: Secondary | ICD-10-CM | POA: Diagnosis not present

## 2015-09-08 MED ORDER — LEVOFLOXACIN 750 MG PO TABS
750.0000 mg | ORAL_TABLET | ORAL | Status: DC
  Administered 2015-09-08: 750 mg via ORAL
  Filled 2015-09-08: qty 1

## 2015-09-08 MED ORDER — BENZONATATE 100 MG PO CAPS
200.0000 mg | ORAL_CAPSULE | Freq: Three times a day (TID) | ORAL | Status: DC | PRN
Start: 2015-09-08 — End: 2015-09-11

## 2015-09-08 MED ORDER — HYDROCOD POLST-CPM POLST ER 10-8 MG/5ML PO SUER
5.0000 mL | Freq: Two times a day (BID) | ORAL | Status: DC | PRN
  Administered 2015-09-10: 5 mL via ORAL
  Filled 2015-09-08: qty 5

## 2015-09-08 NOTE — Care Management Important Message (Signed)
Important Message  Patient Details  Name: JLEN DEBOCK MRN: [id number] Date of Birth: [birth year]   Medicare Important Message Given:  Yes    Juliann Pulse A Zahki Hoogendoorn 09/08/2015, 10:06 AM

## 2015-09-08 NOTE — Progress Notes (Signed)
PHARMACIST - PHYSICIAN COMMUNICATION CONCERNING: Antibiotic IV to Oral Route Change Policy  RECOMMENDATION: This patient is receiving levaquin by the intravenous route.  Based on criteria approved by the Pharmacy and Therapeutics Committee, the antibiotic(s) is/are being converted to the equivalent oral dose form(s).   DESCRIPTION: These criteria include:  Patient being treated for a respiratory tract infection, urinary tract infection, cellulitis or clostridium difficile associated diarrhea if on metronidazole  The patient is not neutropenic and does not exhibit a GI malabsorption state  The patient is eating (either orally or via tube) and/or has been taking other orally administered medications for a least 24 hours  The patient is improving clinically and has a Tmax < 100.5  If you have questions about this conversion, please contact the [place], PharmD Clinical Pharmacist 09/08/2015 9:13 AM

## 2015-09-08 NOTE — Progress Notes (Signed)
[place] at [place] NAME: Darin Lopez    MR#:  [id number]  DATE OF BIRTH:  Nov 25, 1940  SUBJECTIVE:  Patient still with wheezing and cough.  REVIEW OF SYSTEMS:    Review of Systems  Constitutional: Negative for fever, chills and malaise/fatigue.  HENT: Negative for sore throat.   Eyes: Negative for blurred vision.  Respiratory: Positive for cough, shortness of breath and wheezing. Negative for hemoptysis.   Cardiovascular: Negative for chest pain, palpitations and leg swelling.  Gastrointestinal: Negative for nausea, vomiting, abdominal pain, diarrhea and blood in stool.  Genitourinary: Negative for dysuria.  Musculoskeletal: Negative for back pain.  Neurological: Positive for weakness. Negative for dizziness, tremors and headaches.  Endo/Heme/Allergies: Does not bruise/bleed easily.    Tolerating Diet:Yes      DRUG ALLERGIES:   Allergies  Allergen Reactions  . Aspirin Other (See Comments)    Reaction: GI bleed   . Flagyl [Metronidazole Hcl] Nausea And Vomiting  . Adhesive [Tape] Rash    VITALS:  Blood pressure 105/54, pulse 74, temperature 98.1 F (36.7 C), temperature source Oral, resp. rate 18, height 6\' 1"  (1.854 m), weight 89.359 kg (197 lb), SpO2 93 %.  PHYSICAL EXAMINATION:   Physical Exam  Constitutional: He is oriented to person, place, and time. He appears distressed.  Sitting up in bed no tripoding looks mildly distress from coughing  HENT:  Head: Normocephalic.  Eyes: No scleral icterus.  Neck: Normal range of motion. Neck supple. No JVD present. No tracheal deviation present.  Cardiovascular: Normal rate, regular rhythm and normal heart sounds.  Exam reveals no gallop and no friction rub.   No murmur heard. Pulmonary/Chest: Effort normal. No respiratory distress. He has wheezes. He has no rales. He exhibits no tenderness.  Abdominal: Soft. Bowel sounds are normal. He exhibits no distension and no  mass. There is no tenderness. There is no rebound and no guarding.  Musculoskeletal: Normal range of motion. He exhibits no edema.  Neurological: He is alert and oriented to person, place, and time.  Skin: Skin is warm. No rash noted. No erythema.  Psychiatric: Affect and judgment normal.      LABORATORY PANEL:   CBC  Recent Labs Lab 09/06/15 1513  WBC 7.1  HGB 13.5  HCT 41.0  PLT 298   ------------------------------------------------------------------------------------------------------------------  Chemistries   Recent Labs Lab 09/06/15 1513  NA 141  K 4.8  CL 106  CO2 25  GLUCOSE 106*  BUN 14  CREATININE 0.98  CALCIUM 9.8   ------------------------------------------------------------------------------------------------------------------  Cardiac Enzymes  Recent Labs Lab 09/06/15 1513  TROPONINI <0.03   ------------------------------------------------------------------------------------------------------------------  RADIOLOGY:  Dg Chest 2 View  09/06/2015  CLINICAL DATA:  Short of breath and chest pain.  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 Right lower lobe infiltrate is a new finding and consistent with pneumonia. No effusion. Mild scarring in both lung bases. Right middle lobe scarring. IMPRESSION: Right lower lobe pneumonia. Severe COPD with apical emphysema bilaterally. Electronically Signed   By: Franchot Gallo M.D.   On: 09/06/2015 16:15     ASSESSMENT AND PLAN:   75 year old male with a history of COPD and chronic respiratory failure on 4-5 L of oxygen who presents with shortness of breath and found to have community-acquired pneumonia.   1. Community-acquired pneumonia: Continue Levaquin day 2 of 7. Follow up on blood cultures  Follow up on urine strep pneumonia  2. Chronic respiratory failure with COPD exacerbation:  Continue current dose of IV steroids as patient is slow to improve  He is on chronic prednisone at home.  Continue nebulizers and inhalers. Continue 4-5 L of oxygen which patient is on at home  3. Hyperlipidemia: Continue pravastatin.  4. BPH: Continue HYTRIN      Management plans discussed with the patient and he is in agreement.  CODE STATUS: DNR  TOTAL TIME TAKING CARE OF THIS PATIENT: 29 minutes.     POSSIBLE D/C Monday, DEPENDING ON CLINICAL CONDITION.   Almendra Loria M.D on 09/08/2015 at 10:57 AM  Between 7am to 6pm - Pager - [phone] After 6pm go to [email or web address] - password [email or web address] [facility]  Office  [phone]  CC: Primary care physician; Lynnell Jude, MD  Note: This dictation was prepared with Dragon dictation along with smaller phrase technology. Any transcriptional errors that result from this process are unintentional.

## 2015-09-09 LAB — CULTURE, RESPIRATORY W GRAM STAIN

## 2015-09-09 LAB — CBC
HCT: 36.4 % — ABNORMAL LOW (ref 40.0–52.0)
HEMOGLOBIN: 12.1 g/dL — AB (ref 13.0–18.0)
MCH: 30.2 pg (ref 26.0–34.0)
MCHC: 33.3 g/dL (ref 32.0–36.0)
MCV: 90.5 fL (ref 80.0–100.0)
Platelets: 271 10*3/uL (ref 150–440)
RBC: 4.02 MIL/uL — ABNORMAL LOW (ref 4.40–5.90)
RDW: 14.9 % — ABNORMAL HIGH (ref 11.5–14.5)
WBC: 8.3 10*3/uL (ref 3.8–10.6)

## 2015-09-09 LAB — CREATININE, SERUM
Creatinine, Ser: 0.95 mg/dL (ref 0.61–1.24)
GFR calc Af Amer: >60 mL/min (ref >60)

## 2015-09-09 LAB — CULTURE, RESPIRATORY

## 2015-09-09 MED ORDER — LEVALBUTEROL HCL 1.25 MG/0.5ML IN NEBU: 1.2500 mg | INHALATION_SOLUTION | Freq: Four times a day (QID) | RESPIRATORY_TRACT | Status: DC

## 2015-09-09 MED ORDER — LEVALBUTEROL HCL 1.25 MG/0.5ML IN NEBU
1.2500 mg | INHALATION_SOLUTION | Freq: Four times a day (QID) | RESPIRATORY_TRACT | Status: DC
  Administered 2015-09-09 – 2015-09-11 (×7): 1.25 mg via RESPIRATORY_TRACT
  Filled 2015-09-09 (×8): qty 0.5

## 2015-09-09 MED ORDER — TIOTROPIUM BROMIDE MONOHYDRATE 18 MCG IN CAPS
18.0000 ug | ORAL_CAPSULE | Freq: Every day | RESPIRATORY_TRACT | Status: DC
  Administered 2015-09-09 – 2015-09-11 (×3): 18 ug via RESPIRATORY_TRACT
  Filled 2015-09-09: qty 5

## 2015-09-09 MED ORDER — CEPHALEXIN 500 MG PO CAPS
500.0000 mg | ORAL_CAPSULE | Freq: Two times a day (BID) | ORAL | Status: DC
  Administered 2015-09-09 – 2015-09-11 (×5): 500 mg via ORAL
  Filled 2015-09-09 (×5): qty 1

## 2015-09-09 NOTE — Progress Notes (Signed)
Physical Therapy Evaluation Patient Details Name: Darin Lopez MRN: [id number] DOB: 1941-03-11 Today's Date: 09/09/2015   History of Present Illness  Patient is a 75 y.o. male admitted on 06 September 2015 for pneumonia. Was having protracted cough with yellow sputum for 1 week. Complained of worsening SOB, wheezing, and chest tightness. Has been taking an oral antibiotic for 6 days. PMH includes COPD with emphysema, CRF, 5 L O2 at home, and GERD.  Clinical Impression  Patient is a reportedly independent male with hx of COPD with emphysema. Recently admitted for pneumonia. On evaluation, patient demonstrates normal ROM and strength. Patient able to perform all bed mobility and transfers independently. Patient required standing rest breaks to keep oxygen saturations and HR WFL. Patient has one reported fall within past few weeks due to moving from sit to stand and trying to walk when feeling lightheaded. Patient educated about signs/symptoms of hypoxia to prevent falls in future. Patient is on 5 L O2 at home and is at his baseline level of function. No further PT services are needed in hospital or at home after he is medically safe to d/c.     Follow Up Recommendations No PT follow up    Equipment Recommendations  None recommended by PT    Recommendations for Other Services       Precautions / Restrictions Precautions Precautions: Fall Restrictions Weight Bearing Restrictions: No      Mobility  Bed Mobility Overal bed mobility: Independent                Transfers Overall transfer level: Independent Equipment used: None                Ambulation/Gait Ambulation/Gait assistance: Independent Ambulation Distance (Feet): 190 Feet Assistive device: None       General Gait Details: Patient ambulates 42' with supervision on 5L O2. Oxygen saturations and HR tended to fluctuate between 88-92% and 120-130 bpm respectively. Utilized ~4 one minute standing rest breaks to  allow for increased oxygen saturations and decreased HR.  Stairs            Wheelchair Mobility    Modified Rankin (Stroke Patients Only)       Balance Overall balance assessment: Independent;History of Falls                                           Pertinent Vitals/Pain Pain Assessment: No/denies pain    Home Living Family/patient expects to be discharged to:: Private residence Living Arrangements: Spouse/significant other Available Help at Discharge: Family;Available 24 hours/day Type of Home: House Home Access: Stairs to enter Entrance Stairs-Rails: Can reach both Entrance Stairs-Number of Steps: 5 Home Layout: One level Home Equipment: Cane - single point      Prior Function Level of Independence: Needs assistance   Gait / Transfers Assistance Needed: Patient has [place] available but denies use.   ADL's / Homemaking Assistance Needed: Performed by wife        Hand Dominance        Extremity/Trunk Assessment   Upper Extremity Assessment: Overall WFL for tasks assessed           Lower Extremity Assessment: Overall WFL for tasks assessed         Communication   Communication: No difficulties  Cognition Arousal/Alertness: Awake/alert Behavior During Therapy: WFL for tasks assessed/performed Overall Cognitive Status: Within Functional Limits for  tasks assessed                      General Comments      Exercises        Assessment/Plan    PT Assessment Patent does not need any further PT services  PT Diagnosis Difficulty walking   PT Problem List    PT Treatment Interventions     PT Goals (Current goals can be found in the Care Plan section) Acute Rehab PT Goals Patient Stated Goal: "To go home" PT Goal Formulation: With patient/family Time For Goal Achievement: 09/23/15 Potential to Achieve Goals: Good    Frequency     Barriers to discharge        Co-evaluation               End of Session  Equipment Utilized During Treatment: Gait belt;Oxygen Activity Tolerance: Patient tolerated treatment well Patient left: in bed;Other (comment);with family/visitor present (with respiratory therapist) Nurse Communication: Mobility status         Time: 1400-1420 PT Time Calculation (min) (ACUTE ONLY): 20 min   Charges:   PT Evaluation $PT Eval Low Complexity: 1 Procedure     PT G Codes:        Dorice Lamas, PT, DPT 09/09/2015, 2:32 PM

## 2015-09-09 NOTE — Progress Notes (Addendum)
Paged and spoke to Dr. Bridgett Larsson regarding patients HR. It's been in the 120-136's. Nurse did not receive any new orders.

## 2015-09-09 NOTE — Progress Notes (Addendum)
[place] at [place] NAME: Darin Lopez    MR#:  [id number]  DATE OF BIRTH:  [birth year]  SUBJECTIVE:  Patient still has SOB, cough and wheezing. On O2 [place] 5 L.  REVIEW OF SYSTEMS:    Review of Systems  Constitutional: Negative for fever, chills but has generalized weakness.  HENT: Negative for sore throat.   Eyes: Negative for blurred vision.  Respiratory: Positive for cough, shortness of breath and wheezing. Negative for hemoptysis.   Cardiovascular: Negative for chest pain, palpitations and leg swelling.  Gastrointestinal: Negative for nausea, vomiting, abdominal pain, diarrhea and blood in stool.  Genitourinary: Negative for dysuria.  Musculoskeletal: Negative for back pain.  Neurological: No focal weakness. Negative for dizziness, tremors and headaches.  Endo/Heme/Allergies: Does not bruise/bleed easily.    Tolerating Diet:Yes      DRUG ALLERGIES:   Allergies  Allergen Reactions  . Aspirin Other (See Comments)    Reaction: GI bleed   . Flagyl [Metronidazole Hcl] Nausea And Vomiting  . Adhesive [Tape] Rash    VITALS:  Blood pressure 118/55, pulse 100, temperature 98.1 F (36.7 C), temperature source Oral, resp. rate 18, height 6\' 1"  (1.854 m), weight 89.359 kg (197 lb), SpO2 94 %.  PHYSICAL EXAMINATION:   Physical Exam  Constitutional: He is oriented to person, place, and time. He appears distressed.  Sitting up in bed no tripoding looks mildly distress from coughing  HENT:  Head: Normocephalic.  Eyes: No scleral icterus.  Neck: Normal range of motion. Neck supple. No JVD present. No tracheal deviation present.  Cardiovascular: Normal rate, regular rhythm and normal heart sounds.  Exam reveals no gallop and no friction rub.   No murmur heard. Pulmonary/Chest: Effort normal. No respiratory distress. He has expiratory wheezes. He has no rales. He exhibits no tenderness.  with mild use of accessory muscle to  breath. Abdominal: Soft. Bowel sounds are normal. He exhibits no distension and no mass. There is no tenderness. There is no rebound and no guarding.  Musculoskeletal: Normal range of motion. He exhibits no edema.  Neurological: He is alert and oriented to person, place, and time.  Skin: Skin is warm. No rash noted. No erythema.  Psychiatric: Affect and judgment normal.      LABORATORY PANEL:   CBC  Recent Labs Lab 09/09/15 0314  WBC 8.3  HGB 12.1*  HCT 36.4*  PLT 271   ------------------------------------------------------------------------------------------------------------------  Chemistries   Recent Labs Lab 09/06/15 1513 09/09/15 0314  NA 141  --   K 4.8  --   CL 106  --   CO2 25  --   GLUCOSE 106*  --   BUN 14  --   CREATININE 0.98 0.95  CALCIUM 9.8  --    ------------------------------------------------------------------------------------------------------------------  Cardiac Enzymes  Recent Labs Lab 09/06/15 1513  TROPONINI <0.03   ------------------------------------------------------------------------------------------------------------------  RADIOLOGY:  No results found.   ASSESSMENT AND PLAN:   75 year old male with a history of COPD and chronic respiratory failure on 4-5 L of oxygen who presents with shortness of breath and found to have community-acquired pneumonia.   1. Community-acquired pneumonia:  Start keflex, discontinue Levaquin. Sputum culture: E Coli. Negative blood cultures  2. Chronic respiratory failure with COPD exacerbation:  Continue current dose of IV solumedrol.  He is on chronic prednisone at home. Change to xopenex due to tachycardia, continue inhalers. Add spiriva. Continue 5 L of oxygen (home O2)  3. Hyperlipidemia: Continue pravastatin.  4.  Sleep apnea. BiPAP when necessary at night.   All the records are reviewed and case discussed with Care Management/Social Workerr. Management plans discussed with the  patient, family and they are in agreement.  CODE STATUS: DNR  TOTAL TIME TAKING CARE OF THIS PATIENT: 38 minutes.  Greater than 50% time was spent on coordination of care and face-to-face counseling.  POSSIBLE D/C IN 2-3 DAYS, DEPENDING ON CLINICAL CONDITION.   Demetrios Loll M.D on 09/09/2015 at 2:46 PM  Between 7am to 6pm - Pager - [phone] After 6pm go to [email or web address] - password [email or web address] [facility]  Office  [phone]  CC: Primary care physician; Lynnell Jude, MD  Note: This dictation was prepared with Dragon dictation along with smaller phrase technology. Any transcriptional errors that result from this process are unintentional.

## 2015-09-10 LAB — STREP PNEUMONIAE URINARY ANTIGEN: Strep Pneumo Urinary Antigen: NEGATIVE

## 2015-09-10 MED ORDER — PREDNISONE 20 MG PO TABS
40.0000 mg | ORAL_TABLET | Freq: Every day | ORAL | Status: DC
  Administered 2015-09-11: 40 mg via ORAL
  Filled 2015-09-10: qty 2

## 2015-09-10 NOTE — Progress Notes (Signed)
Centralized tele has called several times this morning reporting patient desating in the 80's. Nurse has re-checked with portable O2 device and it is reading 93%. Changed finger probe twice and changed finger. Continue to assess and monitor.

## 2015-09-10 NOTE — Care Management Important Message (Signed)
Important Message  Patient Details  Name: Darin Lopez MRN: [id number] Date of Birth: 06/14/1941   Medicare Important Message Given:  Yes    Jazia Faraci A, RN 09/10/2015, 4:06 PM

## 2015-09-10 NOTE — Progress Notes (Signed)
[facility] [place] NAME: Darin Lopez    MR#:  [id number]  DATE OF BIRTH:  [birth year]  SUBJECTIVE:  better SOB, cough and wheezing. On O2 [place] 5 L.  REVIEW OF SYSTEMS:    Review of Systems  Constitutional: Negative for fever, chills but has generalized weakness.  HENT: Negative for sore throat.   Eyes: Negative for blurred vision.  Respiratory: Positive for cough, shortness of breath and wheezing. Negative for hemoptysis.   Cardiovascular: Negative for chest pain, palpitations and leg swelling.  Gastrointestinal: Negative for nausea, vomiting, abdominal pain, diarrhea and blood in stool.  Genitourinary: Negative for dysuria.  Musculoskeletal: Negative for back pain.  Neurological: No focal weakness. Negative for dizziness, tremors and headaches.  Endo/Heme/Allergies: Does not bruise/bleed easily.    Tolerating Diet:Yes      DRUG ALLERGIES:   Allergies  Allergen Reactions  . Aspirin Other (See Comments)    Reaction: GI bleed   . Flagyl [Metronidazole Hcl] Nausea And Vomiting  . Adhesive [Tape] Rash    VITALS:  Blood pressure 119/76, pulse 89, temperature 98.2 F (36.8 C), temperature source Oral, resp. rate 22, height 6\' 1"  (1.854 m), weight 89.359 kg (197 lb), SpO2 93 %.  PHYSICAL EXAMINATION:   Physical Exam  Constitutional: He is oriented to person, place, and time. NAD. HENT:  Head: Normocephalic.  Eyes: No scleral icterus.  Neck: Normal range of motion. Neck supple. No JVD present. No tracheal deviation present.  Cardiovascular: Normal rate, regular rhythm and normal heart sounds.  Exam reveals no gallop and no friction rub.   No murmur heard. Pulmonary/Chest: Effort normal. No respiratory distress. He has mild expiratory wheezes. He has no rales. He exhibits no tenderness.  with mild use of accessory muscle to breath. Abdominal: Soft. Bowel sounds are normal. He exhibits no distension and no mass. There is  no tenderness. There is no rebound and no guarding.  Musculoskeletal: Normal range of motion. He exhibits no edema.  Neurological: He is alert and oriented to person, place, and time.  Skin: Skin is warm. No rash noted. No erythema.  Psychiatric: Affect and judgment normal.      LABORATORY PANEL:   CBC  Recent Labs Lab 09/09/15 0314  WBC 8.3  HGB 12.1*  HCT 36.4*  PLT 271   ------------------------------------------------------------------------------------------------------------------  Chemistries   Recent Labs Lab 09/06/15 1513 09/09/15 0314  NA 141  --   K 4.8  --   CL 106  --   CO2 25  --   GLUCOSE 106*  --   BUN 14  --   CREATININE 0.98 0.95  CALCIUM 9.8  --    ------------------------------------------------------------------------------------------------------------------  Cardiac Enzymes  Recent Labs Lab 09/06/15 1513  TROPONINI <0.03   ------------------------------------------------------------------------------------------------------------------  RADIOLOGY:  No results found.   ASSESSMENT AND PLAN:   75 year old male with a history of COPD and chronic respiratory failure on 4-5 L of oxygen who presents with shortness of breath and found to have community-acquired pneumonia.   1. Community-acquired pneumonia:  Started keflex, discontinued Levaquin. Sputum culture: E Coli. Negative blood cultures  2. Chronic respiratory failure with COPD exacerbation: discontinue IV solumedrol and start prednisone.  He is on chronic prednisone at home. Changed to xopenex due to tachycardia, continue inhalers. Added spiriva. Continue 5 L of oxygen (home O2)  3. Hyperlipidemia: Continue pravastatin.  4. Sleep apnea. BiPAP when necessary at night.   All the records are reviewed and case discussed  with Care Management/Social Workerr. Management plans discussed with the patient, family and they are in agreement.  CODE STATUS: DNR  TOTAL TIME TAKING  CARE OF THIS PATIENT: 33 minutes.  Greater than 50% time was spent on coordination of care and face-to-face counseling.  POSSIBLE D/C IN 1-2 DAYS, DEPENDING ON CLINICAL CONDITION.   Demetrios Loll M.D on 09/10/2015 at 11:19 AM  Between 7am to 6pm - Pager - [phone] After 6pm go to [email or web address] - password [email or web address] [facility]  Office  [phone]  CC: Primary care physician; Lynnell Jude, MD  Note: This dictation was prepared with Dragon dictation along with smaller phrase technology. Any transcriptional errors that result from this process are unintentional.

## 2015-09-10 NOTE — Progress Notes (Signed)
Dr. Bridgett Larsson d/c'd telemetry. Central tele notified.

## 2015-09-11 LAB — CULTURE, BLOOD (ROUTINE X 2)
CULTURE: NO GROWTH
CULTURE: NO GROWTH

## 2015-09-11 MED ORDER — TIOTROPIUM BROMIDE MONOHYDRATE 18 MCG IN CAPS: 18.0000 ug | ORAL_CAPSULE | Freq: Every day | RESPIRATORY_TRACT | Status: AC

## 2015-09-11 MED ORDER — ALBUTEROL SULFATE HFA 108 (90 BASE) MCG/ACT IN AERS: 2.0000 | INHALATION_SPRAY | Freq: Four times a day (QID) | RESPIRATORY_TRACT | Status: AC | PRN

## 2015-09-11 MED ORDER — HYDROCOD POLST-CPM POLST ER 10-8 MG/5ML PO SUER: 5.0000 mL | Freq: Two times a day (BID) | ORAL | Status: AC | PRN

## 2015-09-11 MED ORDER — PREDNISONE 5 MG PO TABS: ORAL_TABLET | ORAL | Status: AC

## 2015-09-11 MED ORDER — BENZONATATE 100 MG PO CAPS: 100.0000 mg | ORAL_CAPSULE | Freq: Three times a day (TID) | ORAL | Status: AC | PRN

## 2015-09-11 MED ORDER — ACETAMINOPHEN 325 MG PO TABS
650.0000 mg | ORAL_TABLET | Freq: Four times a day (QID) | ORAL | Status: DC | PRN
  Administered 2015-09-11: 650 mg via ORAL
  Filled 2015-09-11: qty 2

## 2015-09-11 MED ORDER — CEPHALEXIN 500 MG PO CAPS: 500.0000 mg | ORAL_CAPSULE | Freq: Two times a day (BID) | ORAL | Status: AC

## 2015-09-11 NOTE — Discharge Instructions (Signed)
Heart healthy diet. Activity as tolerated. Continue home O2 [employer] 5L. Darin Lopez

## 2015-09-11 NOTE — Care Management Note (Signed)
Case Management Note  Patient Details  Name: Darin Lopez MRN: [id number] Date of Birth: 1941-06-25  Subjective/Objective:     [name] chose [facility] to be his home health provider "because they already provide my home oxygen." A referral for home health RN was called to Floydene Flock at [facility]. [name]'s wife will bring his portable oxygen tank from home today for transport home.              Action/Plan:   Expected Discharge Date:                  Expected Discharge Plan:     In-House Referral:     Discharge planning Services     Post Acute Care Choice:    Choice offered to:     DME Arranged:    DME Agency:     HH Arranged:    [place] Agency:     Status of Service:     Medicare Important Message Given:  Yes Date Medicare IM Given:    Medicare IM give by:    Date Additional Medicare IM Given:    Additional Medicare Important Message give by:     If discussed at [place] of Stay Meetings, dates discussed:    Additional Comments:  Denise Bramblett A, RN 09/11/2015, 11:00 AM

## 2015-09-11 NOTE — Progress Notes (Signed)
Pt discharged to home. Home Health set up with [facility] was able to speak with pt prior to d/c. Pt's wife provided transport, pt has own oxygen for transport. Reviewed meds and d/c instructions. Pt to follow up with MD in 1 week.

## 2015-09-11 NOTE — Discharge Summary (Signed)
[facility] at [place] NAME: Darin Lopez    MR#:  [id number]  DATE OF BIRTH:  August 20, 1940  DATE OF ADMISSION:  09/06/2015 ADMITTING PHYSICIAN: Demetrios Loll, MD  DATE OF DISCHARGE: 09/11/2015 PRIMARY CARE PHYSICIAN: BLISS, Lynnell Jude, MD    ADMISSION DIAGNOSIS:  chest pain, cough   DISCHARGE DIAGNOSIS:  Community-acquired pneumonia Chronic respiratory failure with COPD exacerbation  SECONDARY DIAGNOSIS:   Past Medical History  Diagnosis Date  . COPD (chronic obstructive pulmonary disease) ([place])   . Emphysema of lung ([place])   . Colitis   . GERD (gastroesophageal reflux disease)   . Emphysema   . On home oxygen therapy     4L - continuous  . Depression   . Shortness of breath dyspnea   . Hypertension     Hx - denies current issues  . Sleep apnea     CPAP  . Wears dentures     full upper and lower  . Heart murmur     "all my life"    HOSPITAL COURSE:   75 year old male with a history of COPD and chronic respiratory failure on 4-5 L of oxygen who presents with shortness of breath and found to have community-acquired pneumonia.   1. Community-acquired pneumonia:  He was treated with Levaquin and changed to keflex since Sputum culture: E Coli. Negative blood cultures  2. Chronic respiratory failure with COPD exacerbation: He was treated wtih IV solumedrol and changed to prednisone. He is on chronic prednisone at home. Changed to xopenex due to tachycardia, continue inhalers. Added spiriva. Continue 5 L of oxygen (home O2)  3. Hyperlipidemia: Continue pravastatin.  4. Sleep apnea. BiPAP when necessary at night.   DISCHARGE CONDITIONS:   Stable, discharge to home with [facility] today.  CONSULTS OBTAINED:     DRUG ALLERGIES:   Allergies  Allergen Reactions  . Aspirin Other (See Comments)    Reaction: GI bleed   . Flagyl [Metronidazole Hcl] Nausea And Vomiting  . Adhesive [Tape] Rash    DISCHARGE MEDICATIONS:    Current Discharge Medication List    START taking these medications   Details  albuterol (PROVENTIL HFA;VENTOLIN HFA) 108 (90 Base) MCG/ACT inhaler Inhale 2 puffs into the lungs every 6 (six) hours as needed for wheezing or shortness of breath. Qty: 1 Inhaler, Refills: 2    benzonatate (TESSALON PERLES) 100 MG capsule Take 1 capsule (100 mg total) by mouth 3 (three) times daily as needed for cough. Qty: 20 capsule, Refills: 0    cephALEXin (KEFLEX) 500 MG capsule Take 1 capsule (500 mg total) by mouth every 12 (twelve) hours. Qty: 10 capsule, Refills: 0    chlorpheniramine-HYDROcodone (TUSSIONEX) 10-8 MG/5ML SUER Take 5 mLs by mouth every 12 (twelve) hours as needed for cough. Qty: 115 mL, Refills: 0    !! predniSONE (DELTASONE) 5 MG tablet 40 mg po daily for 2 days, 20 mg po daily for 2 days, then continue home dose 10 mg po daily. Qty: 30 tablet, Refills: 0    tiotropium (SPIRIVA) 18 MCG inhalation capsule Place 1 capsule (18 mcg total) into inhaler and inhale daily. Qty: 30 capsule, Refills: 2     !! - Potential duplicate medications found. Please discuss with provider.    CONTINUE these medications which have NOT CHANGED   Details  acidophilus (RISAQUAD) CAPS capsule Take 1 capsule by mouth daily.    Budesonide (UCERIS) 9 MG TB24 Take 9 mg by mouth daily.  budesonide-formoterol (SYMBICORT) 160-4.5 MCG/ACT inhaler Inhale 2 puffs into the lungs 2 (two) times daily.    Coenzyme Q10 (CO Q 10) 60 MG CAPS Take 60 mg by mouth daily.    diphenhydrAMINE (BENADRYL) 25 MG tablet Take 25 mg by mouth at bedtime as needed for allergies or sleep.     gabapentin (NEURONTIN) 300 MG capsule Take 300 mg by mouth at bedtime.     mercaptopurine (PURINETHOL) 50 MG tablet Take 100 mg by mouth daily.    mesalamine (PENTASA) 250 MG CR capsule Take 1,000 mg by mouth daily.     Multiple Vitamin (MULTIVITAMIN WITH MINERALS) TABS tablet Take 1 tablet by mouth daily.    nitroGLYCERIN  (NITROSTAT) 0.4 MG SL tablet Place 0.4 mg under the tongue every 5 (five) minutes as needed for chest pain.    omeprazole (PRILOSEC) 20 MG capsule Take 20 mg by mouth daily.    pravastatin (PRAVACHOL) 20 MG tablet Take 20 mg by mouth at bedtime.     !! predniSONE (DELTASONE) 10 MG tablet Take 10 mg by mouth daily.    salmeterol (SEREVENT) 50 MCG/DOSE diskus inhaler Inhale 1 puff into the lungs daily.    sertraline (ZOLOFT) 50 MG tablet Take 100 mg by mouth daily.     terazosin (HYTRIN) 10 MG capsule Take 20 mg by mouth at bedtime.     zolpidem (AMBIEN) 10 MG tablet Take 5-10 mg by mouth at bedtime as needed for sleep.      !! - Potential duplicate medications found. Please discuss with provider.    STOP taking these medications     levofloxacin (LEVAQUIN) 500 MG tablet      sulfamethoxazole-trimethoprim (BACTRIM,SEPTRA) 400-80 MG per tablet          DISCHARGE INSTRUCTIONS:   If you experience worsening of your admission symptoms, develop shortness of breath, life threatening emergency, suicidal or homicidal thoughts you must seek medical attention immediately by calling 911 or calling your MD immediately  if symptoms less severe.  You Must read complete instructions/literature along with all the possible adverse reactions/side effects for all the Medicines you take and that have been prescribed to you. Take any new Medicines after you have completely understood and accept all the possible adverse reactions/side effects.   Please note  You were cared for by a hospitalist during your hospital stay. If you have any questions about your discharge medications or the care you received while you were in the hospital after you are discharged, you can call the unit and asked to speak with the hospitalist on call if the hospitalist that took care of you is not available. Once you are discharged, your primary care physician will handle any further medical issues. Please note that NO REFILLS  for any discharge medications will be authorized once you are discharged, as it is imperative that you return to your primary care physician (or establish a relationship with a primary care physician if you do not have one) for your aftercare needs so that they can reassess your need for medications and monitor your lab values.    Today   SUBJECTIVE   He feels much better, has mild cough, no wheezing or SOB.    VITAL SIGNS:  Blood pressure 139/85, pulse 96, temperature 97.6 F (36.4 C), temperature source Oral, resp. rate 20, height 6\' 1"  (1.854 m), weight 89.359 kg (197 lb), SpO2 91 %.  I/O:   Intake/Output Summary (Last 24 hours) at 09/11/15 1115 Last data filed  at 09/10/15 2142  Gross per 24 hour  Intake    843 ml  Output      0 ml  Net    843 ml    PHYSICAL EXAMINATION:  GENERAL:  75 y.o.-year-old patient lying in the bed with no acute distress. On L2 [place] 5 L. EYES: Pupils equal, round, reactive to light and accommodation. No scleral icterus. Extraocular muscles intact.  HEENT: Head atraumatic, normocephalic. Oropharynx and nasopharynx clear.  NECK:  Supple, no jugular venous distention. No thyroid enlargement, no tenderness.  LUNGS: Normal breath sounds bilaterally, no wheezing, rales,rhonchi or crepitation. No use of accessory muscles of respiration.  CARDIOVASCULAR: S1, S2 normal. No murmurs, rubs, or gallops.  ABDOMEN: Soft, non-tender, non-distended. Bowel sounds present. No organomegaly or mass.  EXTREMITIES: No pedal edema, cyanosis, or clubbing.  NEUROLOGIC: Cranial nerves II through XII are intact. Muscle strength 5/5 in all extremities. Sensation intact. Gait not checked.  PSYCHIATRIC: The patient is alert and oriented x 3.  SKIN: No obvious rash, lesion, or ulcer.   DATA REVIEW:   CBC  Recent Labs Lab 09/09/15 0314  WBC 8.3  HGB 12.1*  HCT 36.4*  PLT 271    Chemistries   Recent Labs Lab 09/06/15 1513 09/09/15 0314  NA 141  --   K 4.8  --   CL  106  --   CO2 25  --   GLUCOSE 106*  --   BUN 14  --   CREATININE 0.98 0.95  CALCIUM 9.8  --     Cardiac Enzymes  Recent Labs Lab 09/06/15 1513  TROPONINI <0.03    Microbiology Results  Results for orders placed or performed during the hospital encounter of 09/06/15  Culture, blood (Routine X 2) w Reflex to ID Panel     Status: None   Collection Time: 09/06/15  3:09 PM  Result Value Ref Range Status   Specimen Description BLOOD LEFT ASSIST CONTROL  Final   Special Requests BOTTLES DRAWN AEROBIC AND ANAEROBIC  5CC  Final   Culture NO GROWTH 5 DAYS  Final   Report Status 09/11/2015 FINAL  Final  Culture, blood (Routine X 2) w Reflex to ID Panel     Status: None   Collection Time: 09/06/15  3:09 PM  Result Value Ref Range Status   Specimen Description BLOOD LEFT HAND  Final   Special Requests BOTTLES DRAWN AEROBIC AND ANAEROBIC  1CC  Final   Culture NO GROWTH 5 DAYS  Final   Report Status 09/11/2015 FINAL  Final  Culture, sputum-assessment     Status: None   Collection Time: 09/07/15  9:48 AM  Result Value Ref Range Status   Specimen Description EXPECTORATED SPUTUM  Final   Special Requests NONE  Final   Sputum evaluation THIS SPECIMEN IS ACCEPTABLE FOR SPUTUM CULTURE  Final   Report Status 09/07/2015 FINAL  Final  Culture, respiratory (NON-Expectorated)     Status: None   Collection Time: 09/07/15  9:48 AM  Result Value Ref Range Status   Specimen Description EXPECTORATED SPUTUM  Final   Special Requests NONE Reflexed from [id number]  Final   Gram Stain   Final    FEW WBC SEEN MODERATE GRAM POSITIVE COCCI RARE GRAM NEGATIVE RODS    Culture MODERATE GROWTH ESCHERICHIA COLI  Final   Report Status 09/09/2015 FINAL  Final   Organism ID, Bacteria ESCHERICHIA COLI  Final      Susceptibility   Escherichia coli - MIC*  AMPICILLIN >=32 RESISTANT Resistant     CEFAZOLIN <=4 SENSITIVE Sensitive     CEFEPIME <=1 SENSITIVE Sensitive     CEFTAZIDIME <=1 SENSITIVE Sensitive      CEFTRIAXONE <=1 SENSITIVE Sensitive     CIPROFLOXACIN >=4 RESISTANT Resistant     GENTAMICIN 2 SENSITIVE Sensitive     IMIPENEM <=0.25 SENSITIVE Sensitive     TRIMETH/SULFA >=320 RESISTANT Resistant     AMPICILLIN/SULBACTAM 16 INTERMEDIATE Intermediate     PIP/TAZO <=4 SENSITIVE Sensitive     Extended ESBL NEGATIVE Sensitive     * MODERATE GROWTH ESCHERICHIA COLI    RADIOLOGY:  No results found.      Management plans discussed with the patient, family and they are in agreement.  CODE STATUS:     Code Status Orders        Start     Ordered   09/06/15 2033  Do not attempt resuscitation (DNR)   Continuous    Question Answer Comment  In the event of cardiac or respiratory ARREST Do not call a "code blue"   In the event of cardiac or respiratory ARREST Do not perform Intubation, CPR, defibrillation or ACLS   In the event of cardiac or respiratory ARREST Use medication by any route, position, wound care, and other measures to relive pain and suffering. May use oxygen, suction and manual treatment of airway obstruction as needed for comfort.      09/06/15 2032    Code Status History    Date Active Date Inactive Code Status Order ID Comments User Context   This patient has a current code status but no historical code status.    Advance Directive Documentation        Most Recent Value   Type of Advance Directive  Living will   Pre-existing out of facility DNR order (yellow form or pink MOST form)     "MOST" Form in Place?        TOTAL TIME TAKING CARE OF THIS PATIENT: 36 minutes.    Demetrios Loll M.D on 09/11/2015 at 11:15 AM  Between 7am to 6pm - Pager - [phone]  After 6pm go to [email or web address] - password [email or web address] [facility]  Office  [phone]  CC: Primary care physician; Lynnell Jude, MD

## 2015-09-12 DIAGNOSIS — Z79899 Other long term (current) drug therapy: Secondary | ICD-10-CM | POA: Diagnosis not present

## 2015-09-12 DIAGNOSIS — K5100 Ulcerative (chronic) pancolitis without complications: Secondary | ICD-10-CM | POA: Diagnosis not present

## 2015-09-18 DIAGNOSIS — J159 Unspecified bacterial pneumonia: Secondary | ICD-10-CM | POA: Diagnosis not present

## 2015-09-18 DIAGNOSIS — J441 Chronic obstructive pulmonary disease with (acute) exacerbation: Secondary | ICD-10-CM | POA: Diagnosis not present

## 2015-09-18 DIAGNOSIS — Z9981 Dependence on supplemental oxygen: Secondary | ICD-10-CM | POA: Diagnosis not present

## 2015-09-25 DIAGNOSIS — H6123 Impacted cerumen, bilateral: Secondary | ICD-10-CM | POA: Diagnosis not present

## 2015-09-25 DIAGNOSIS — J069 Acute upper respiratory infection, unspecified: Secondary | ICD-10-CM | POA: Diagnosis not present

## 2015-09-25 DIAGNOSIS — B370 Candidal stomatitis: Secondary | ICD-10-CM | POA: Diagnosis not present

## 2015-10-02 DIAGNOSIS — J449 Chronic obstructive pulmonary disease, unspecified: Secondary | ICD-10-CM | POA: Diagnosis not present

## 2015-10-03 DIAGNOSIS — K51919 Ulcerative colitis, unspecified with unspecified complications: Secondary | ICD-10-CM | POA: Diagnosis not present

## 2015-10-03 DIAGNOSIS — Z6825 Body mass index (BMI) 25.0-25.9, adult: Secondary | ICD-10-CM | POA: Diagnosis not present

## 2015-10-05 DIAGNOSIS — H902 Conductive hearing loss, unspecified: Secondary | ICD-10-CM | POA: Diagnosis not present

## 2015-10-05 DIAGNOSIS — H90A22 Sensorineural hearing loss, unilateral, left ear, with restricted hearing on the contralateral side: Secondary | ICD-10-CM | POA: Diagnosis not present

## 2015-10-05 DIAGNOSIS — H6981 Other specified disorders of Eustachian tube, right ear: Secondary | ICD-10-CM | POA: Diagnosis not present

## 2015-10-05 DIAGNOSIS — H6501 Acute serous otitis media, right ear: Secondary | ICD-10-CM | POA: Diagnosis not present

## 2015-10-05 DIAGNOSIS — H90A11 Conductive hearing loss, unilateral, right ear with restricted hearing on the contralateral side: Secondary | ICD-10-CM | POA: Diagnosis not present

## 2015-10-20 ENCOUNTER — Other Ambulatory Visit: Payer: Self-pay | Admitting: Family Medicine

## 2015-10-20 ENCOUNTER — Ambulatory Visit
Admission: RE | Admit: 2015-10-20 | Discharge: 2015-10-20 | Disposition: A | Discharge status: Home / Self Care | Payer: PPO | Source: Ambulatory Visit | Attending: Family Medicine | Admitting: Family Medicine

## 2015-10-20 DIAGNOSIS — S40012A Contusion of left shoulder, initial encounter: Secondary | ICD-10-CM

## 2015-10-20 DIAGNOSIS — X58XXXA Exposure to other specified factors, initial encounter: Secondary | ICD-10-CM | POA: Diagnosis not present

## 2015-10-20 DIAGNOSIS — S4992XA Unspecified injury of left shoulder and upper arm, initial encounter: Secondary | ICD-10-CM | POA: Diagnosis not present

## 2015-10-20 DIAGNOSIS — M25512 Pain in left shoulder: Secondary | ICD-10-CM | POA: Diagnosis not present

## 2015-10-20 DIAGNOSIS — S8002XA Contusion of left knee, initial encounter: Secondary | ICD-10-CM

## 2015-10-20 DIAGNOSIS — M179 Osteoarthritis of knee, unspecified: Secondary | ICD-10-CM | POA: Diagnosis not present

## 2015-10-24 DIAGNOSIS — K51919 Ulcerative colitis, unspecified with unspecified complications: Secondary | ICD-10-CM | POA: Diagnosis not present

## 2015-10-24 DIAGNOSIS — K51819 Other ulcerative colitis with unspecified complications: Secondary | ICD-10-CM | POA: Diagnosis not present

## 2015-11-03 DIAGNOSIS — H6981 Other specified disorders of Eustachian tube, right ear: Secondary | ICD-10-CM | POA: Diagnosis not present

## 2015-11-03 DIAGNOSIS — H902 Conductive hearing loss, unspecified: Secondary | ICD-10-CM | POA: Diagnosis not present

## 2015-11-03 DIAGNOSIS — H6521 Chronic serous otitis media, right ear: Secondary | ICD-10-CM | POA: Diagnosis not present

## 2015-11-24 DIAGNOSIS — H903 Sensorineural hearing loss, bilateral: Secondary | ICD-10-CM | POA: Diagnosis not present

## 2015-11-24 DIAGNOSIS — H6521 Chronic serous otitis media, right ear: Secondary | ICD-10-CM | POA: Diagnosis not present

## 2015-12-05 DIAGNOSIS — K51019 Ulcerative (chronic) pancolitis with unspecified complications: Secondary | ICD-10-CM | POA: Diagnosis not present

## 2016-01-16 DIAGNOSIS — Z79899 Other long term (current) drug therapy: Secondary | ICD-10-CM | POA: Diagnosis not present

## 2016-01-16 DIAGNOSIS — K5100 Ulcerative (chronic) pancolitis without complications: Secondary | ICD-10-CM | POA: Diagnosis not present

## 2016-03-05 DIAGNOSIS — K5100 Ulcerative (chronic) pancolitis without complications: Secondary | ICD-10-CM | POA: Diagnosis not present

## 2016-03-05 DIAGNOSIS — K51019 Ulcerative (chronic) pancolitis with unspecified complications: Secondary | ICD-10-CM | POA: Diagnosis not present

## 2016-04-10 DIAGNOSIS — H524 Presbyopia: Secondary | ICD-10-CM | POA: Diagnosis not present

## 2016-04-10 DIAGNOSIS — H353130 Nonexudative age-related macular degeneration, bilateral, stage unspecified: Secondary | ICD-10-CM | POA: Diagnosis not present

## 2016-04-10 DIAGNOSIS — H35373 Puckering of macula, bilateral: Secondary | ICD-10-CM | POA: Diagnosis not present

## 2016-04-10 DIAGNOSIS — H04123 Dry eye syndrome of bilateral lacrimal glands: Secondary | ICD-10-CM | POA: Diagnosis not present

## 2016-04-23 DIAGNOSIS — Z79899 Other long term (current) drug therapy: Secondary | ICD-10-CM | POA: Diagnosis not present

## 2016-04-23 DIAGNOSIS — J449 Chronic obstructive pulmonary disease, unspecified: Secondary | ICD-10-CM | POA: Diagnosis not present

## 2016-04-23 DIAGNOSIS — K51019 Ulcerative (chronic) pancolitis with unspecified complications: Secondary | ICD-10-CM | POA: Diagnosis not present

## 2016-04-24 DIAGNOSIS — D485 Neoplasm of uncertain behavior of skin: Secondary | ICD-10-CM | POA: Diagnosis not present

## 2016-04-24 DIAGNOSIS — Z6826 Body mass index (BMI) 26.0-26.9, adult: Secondary | ICD-10-CM | POA: Diagnosis not present

## 2016-04-24 DIAGNOSIS — L570 Actinic keratosis: Secondary | ICD-10-CM | POA: Diagnosis not present

## 2016-05-03 DIAGNOSIS — H353210 Exudative age-related macular degeneration, right eye, stage unspecified: Secondary | ICD-10-CM | POA: Diagnosis not present

## 2016-05-20 DIAGNOSIS — D485 Neoplasm of uncertain behavior of skin: Secondary | ICD-10-CM | POA: Diagnosis not present

## 2016-05-20 DIAGNOSIS — Z6825 Body mass index (BMI) 25.0-25.9, adult: Secondary | ICD-10-CM | POA: Diagnosis not present

## 2016-05-20 DIAGNOSIS — L239 Allergic contact dermatitis, unspecified cause: Secondary | ICD-10-CM | POA: Diagnosis not present

## 2016-05-20 DIAGNOSIS — K51918 Ulcerative colitis, unspecified with other complication: Secondary | ICD-10-CM | POA: Diagnosis not present

## 2016-05-20 DIAGNOSIS — F5101 Primary insomnia: Secondary | ICD-10-CM | POA: Diagnosis not present

## 2016-05-24 DIAGNOSIS — J449 Chronic obstructive pulmonary disease, unspecified: Secondary | ICD-10-CM | POA: Diagnosis not present

## 2016-06-04 DIAGNOSIS — Z85828 Personal history of other malignant neoplasm of skin: Secondary | ICD-10-CM | POA: Diagnosis not present

## 2016-06-04 DIAGNOSIS — Z6826 Body mass index (BMI) 26.0-26.9, adult: Secondary | ICD-10-CM | POA: Diagnosis not present

## 2016-06-04 DIAGNOSIS — D2339 Other benign neoplasm of skin of other parts of face: Secondary | ICD-10-CM | POA: Diagnosis not present

## 2016-06-04 DIAGNOSIS — D485 Neoplasm of uncertain behavior of skin: Secondary | ICD-10-CM | POA: Diagnosis not present

## 2016-06-04 DIAGNOSIS — C44212 Basal cell carcinoma of skin of right ear and external auricular canal: Secondary | ICD-10-CM | POA: Diagnosis not present

## 2016-06-13 DIAGNOSIS — K5100 Ulcerative (chronic) pancolitis without complications: Secondary | ICD-10-CM | POA: Diagnosis not present

## 2016-06-23 DIAGNOSIS — J449 Chronic obstructive pulmonary disease, unspecified: Secondary | ICD-10-CM | POA: Diagnosis not present

## 2016-07-16 DIAGNOSIS — Z792 Long term (current) use of antibiotics: Secondary | ICD-10-CM | POA: Diagnosis not present

## 2016-07-16 DIAGNOSIS — C44609 Unspecified malignant neoplasm of skin of left upper limb, including shoulder: Secondary | ICD-10-CM | POA: Diagnosis not present

## 2016-07-16 DIAGNOSIS — K5190 Ulcerative colitis, unspecified, without complications: Secondary | ICD-10-CM | POA: Diagnosis not present

## 2016-07-16 DIAGNOSIS — Z7951 Long term (current) use of inhaled steroids: Secondary | ICD-10-CM | POA: Diagnosis not present

## 2016-07-16 DIAGNOSIS — Z9981 Dependence on supplemental oxygen: Secondary | ICD-10-CM | POA: Diagnosis not present

## 2016-07-16 DIAGNOSIS — Z7952 Long term (current) use of systemic steroids: Secondary | ICD-10-CM | POA: Diagnosis not present

## 2016-07-16 DIAGNOSIS — J449 Chronic obstructive pulmonary disease, unspecified: Secondary | ICD-10-CM | POA: Diagnosis not present

## 2016-07-16 DIAGNOSIS — Z6827 Body mass index (BMI) 27.0-27.9, adult: Secondary | ICD-10-CM | POA: Diagnosis not present

## 2016-07-16 DIAGNOSIS — K51919 Ulcerative colitis, unspecified with unspecified complications: Secondary | ICD-10-CM | POA: Diagnosis not present

## 2016-07-16 DIAGNOSIS — Z79891 Long term (current) use of opiate analgesic: Secondary | ICD-10-CM | POA: Diagnosis not present

## 2016-07-16 DIAGNOSIS — Z9225 Personal history of immunosupression therapy: Secondary | ICD-10-CM | POA: Diagnosis not present

## 2016-07-16 DIAGNOSIS — Z79899 Other long term (current) drug therapy: Secondary | ICD-10-CM | POA: Diagnosis not present

## 2016-07-16 DIAGNOSIS — Z85828 Personal history of other malignant neoplasm of skin: Secondary | ICD-10-CM | POA: Diagnosis not present

## 2016-07-24 DIAGNOSIS — J449 Chronic obstructive pulmonary disease, unspecified: Secondary | ICD-10-CM | POA: Diagnosis not present

## 2016-07-25 DIAGNOSIS — K51019 Ulcerative (chronic) pancolitis with unspecified complications: Secondary | ICD-10-CM | POA: Diagnosis not present

## 2016-07-29 DIAGNOSIS — Z85828 Personal history of other malignant neoplasm of skin: Secondary | ICD-10-CM | POA: Diagnosis not present

## 2016-07-29 DIAGNOSIS — Z6826 Body mass index (BMI) 26.0-26.9, adult: Secondary | ICD-10-CM | POA: Diagnosis not present

## 2016-07-29 DIAGNOSIS — C44629 Squamous cell carcinoma of skin of left upper limb, including shoulder: Secondary | ICD-10-CM | POA: Diagnosis not present

## 2016-07-29 DIAGNOSIS — D485 Neoplasm of uncertain behavior of skin: Secondary | ICD-10-CM | POA: Diagnosis not present

## 2016-07-29 DIAGNOSIS — D044 Carcinoma in situ of skin of scalp and neck: Secondary | ICD-10-CM | POA: Diagnosis not present

## 2016-08-05 DIAGNOSIS — Z6826 Body mass index (BMI) 26.0-26.9, adult: Secondary | ICD-10-CM | POA: Diagnosis not present

## 2016-08-05 DIAGNOSIS — Z9981 Dependence on supplemental oxygen: Secondary | ICD-10-CM | POA: Diagnosis not present

## 2016-08-05 DIAGNOSIS — J449 Chronic obstructive pulmonary disease, unspecified: Secondary | ICD-10-CM | POA: Diagnosis not present

## 2016-08-24 DIAGNOSIS — J449 Chronic obstructive pulmonary disease, unspecified: Secondary | ICD-10-CM | POA: Diagnosis not present

## 2016-09-02 DIAGNOSIS — R05 Cough: Secondary | ICD-10-CM | POA: Diagnosis not present

## 2016-09-02 DIAGNOSIS — H10023 Other mucopurulent conjunctivitis, bilateral: Secondary | ICD-10-CM | POA: Diagnosis not present

## 2016-09-02 DIAGNOSIS — D485 Neoplasm of uncertain behavior of skin: Secondary | ICD-10-CM | POA: Diagnosis not present

## 2016-09-02 DIAGNOSIS — Z6826 Body mass index (BMI) 26.0-26.9, adult: Secondary | ICD-10-CM | POA: Diagnosis not present

## 2016-09-02 DIAGNOSIS — C4492 Squamous cell carcinoma of skin, unspecified: Secondary | ICD-10-CM | POA: Diagnosis not present

## 2016-09-05 DIAGNOSIS — K50019 Crohn's disease of small intestine with unspecified complications: Secondary | ICD-10-CM | POA: Diagnosis not present

## 2016-09-05 DIAGNOSIS — K5100 Ulcerative (chronic) pancolitis without complications: Secondary | ICD-10-CM | POA: Diagnosis not present

## 2016-09-05 DIAGNOSIS — Z79899 Other long term (current) drug therapy: Secondary | ICD-10-CM | POA: Diagnosis not present

## 2016-09-21 DIAGNOSIS — J449 Chronic obstructive pulmonary disease, unspecified: Secondary | ICD-10-CM | POA: Diagnosis not present

## 2016-10-02 DIAGNOSIS — C4492 Squamous cell carcinoma of skin, unspecified: Secondary | ICD-10-CM | POA: Diagnosis not present

## 2016-10-02 DIAGNOSIS — B079 Viral wart, unspecified: Secondary | ICD-10-CM | POA: Diagnosis not present

## 2016-10-02 DIAGNOSIS — Z9981 Dependence on supplemental oxygen: Secondary | ICD-10-CM | POA: Diagnosis not present

## 2016-10-02 DIAGNOSIS — Z6826 Body mass index (BMI) 26.0-26.9, adult: Secondary | ICD-10-CM | POA: Diagnosis not present

## 2016-10-02 DIAGNOSIS — J449 Chronic obstructive pulmonary disease, unspecified: Secondary | ICD-10-CM | POA: Diagnosis not present

## 2016-10-17 DIAGNOSIS — K5100 Ulcerative (chronic) pancolitis without complications: Secondary | ICD-10-CM | POA: Diagnosis not present

## 2016-10-22 DIAGNOSIS — J449 Chronic obstructive pulmonary disease, unspecified: Secondary | ICD-10-CM | POA: Diagnosis not present

## 2016-10-31 DIAGNOSIS — J449 Chronic obstructive pulmonary disease, unspecified: Secondary | ICD-10-CM | POA: Diagnosis not present

## 2016-11-21 DIAGNOSIS — J449 Chronic obstructive pulmonary disease, unspecified: Secondary | ICD-10-CM | POA: Diagnosis not present

## 2016-11-27 DIAGNOSIS — C44622 Squamous cell carcinoma of skin of right upper limb, including shoulder: Secondary | ICD-10-CM | POA: Diagnosis not present

## 2016-11-27 DIAGNOSIS — F411 Generalized anxiety disorder: Secondary | ICD-10-CM | POA: Diagnosis not present

## 2016-11-27 DIAGNOSIS — Z1389 Encounter for screening for other disorder: Secondary | ICD-10-CM | POA: Diagnosis not present

## 2016-11-27 DIAGNOSIS — C4492 Squamous cell carcinoma of skin, unspecified: Secondary | ICD-10-CM | POA: Diagnosis not present

## 2016-11-27 DIAGNOSIS — Z6826 Body mass index (BMI) 26.0-26.9, adult: Secondary | ICD-10-CM | POA: Diagnosis not present

## 2016-11-27 DIAGNOSIS — C4442 Squamous cell carcinoma of skin of scalp and neck: Secondary | ICD-10-CM | POA: Diagnosis not present

## 2016-11-27 DIAGNOSIS — C4440 Unspecified malignant neoplasm of skin of scalp and neck: Secondary | ICD-10-CM | POA: Diagnosis not present

## 2016-11-28 DIAGNOSIS — K51019 Ulcerative (chronic) pancolitis with unspecified complications: Secondary | ICD-10-CM | POA: Diagnosis not present

## 2016-12-01 DIAGNOSIS — J449 Chronic obstructive pulmonary disease, unspecified: Secondary | ICD-10-CM | POA: Diagnosis not present

## 2016-12-11 DIAGNOSIS — D485 Neoplasm of uncertain behavior of skin: Secondary | ICD-10-CM | POA: Diagnosis not present

## 2016-12-22 DIAGNOSIS — J449 Chronic obstructive pulmonary disease, unspecified: Secondary | ICD-10-CM | POA: Diagnosis not present

## 2016-12-31 DIAGNOSIS — J449 Chronic obstructive pulmonary disease, unspecified: Secondary | ICD-10-CM | POA: Diagnosis not present

## 2017-01-02 DIAGNOSIS — H6123 Impacted cerumen, bilateral: Secondary | ICD-10-CM | POA: Diagnosis not present

## 2017-01-02 DIAGNOSIS — H6981 Other specified disorders of Eustachian tube, right ear: Secondary | ICD-10-CM | POA: Diagnosis not present

## 2017-01-02 DIAGNOSIS — H93293 Other abnormal auditory perceptions, bilateral: Secondary | ICD-10-CM | POA: Diagnosis not present

## 2017-01-09 DIAGNOSIS — K5100 Ulcerative (chronic) pancolitis without complications: Secondary | ICD-10-CM | POA: Diagnosis not present

## 2017-01-21 DIAGNOSIS — J449 Chronic obstructive pulmonary disease, unspecified: Secondary | ICD-10-CM | POA: Diagnosis not present

## 2017-01-31 DIAGNOSIS — J449 Chronic obstructive pulmonary disease, unspecified: Secondary | ICD-10-CM | POA: Diagnosis not present

## 2017-02-04 DIAGNOSIS — K51919 Ulcerative colitis, unspecified with unspecified complications: Secondary | ICD-10-CM | POA: Diagnosis not present

## 2017-02-04 DIAGNOSIS — Z6827 Body mass index (BMI) 27.0-27.9, adult: Secondary | ICD-10-CM | POA: Diagnosis not present

## 2017-02-11 ENCOUNTER — Encounter: Payer: Non-veteran care | Attending: Internal Medicine

## 2017-02-11 VITALS — Ht 72.4 in | Wt 202.3 lb

## 2017-02-11 DIAGNOSIS — Z87891 Personal history of nicotine dependence: Secondary | ICD-10-CM | POA: Diagnosis not present

## 2017-02-11 DIAGNOSIS — J449 Chronic obstructive pulmonary disease, unspecified: Secondary | ICD-10-CM | POA: Diagnosis present

## 2017-02-11 DIAGNOSIS — G4730 Sleep apnea, unspecified: Secondary | ICD-10-CM | POA: Diagnosis not present

## 2017-02-11 DIAGNOSIS — Z9981 Dependence on supplemental oxygen: Secondary | ICD-10-CM | POA: Diagnosis not present

## 2017-02-11 DIAGNOSIS — F329 Major depressive disorder, single episode, unspecified: Secondary | ICD-10-CM | POA: Insufficient documentation

## 2017-02-11 DIAGNOSIS — Z7951 Long term (current) use of inhaled steroids: Secondary | ICD-10-CM | POA: Diagnosis not present

## 2017-02-11 DIAGNOSIS — Z79899 Other long term (current) drug therapy: Secondary | ICD-10-CM | POA: Insufficient documentation

## 2017-02-11 DIAGNOSIS — K219 Gastro-esophageal reflux disease without esophagitis: Secondary | ICD-10-CM | POA: Insufficient documentation

## 2017-02-11 NOTE — Patient Instructions (Signed)
Patient Instructions  Patient Details  Name: Darin Lopez MRN: [id number] Date of Birth: 1940-10-22 Referring Provider:  Raelyn Mora, MD  Below are the personal goals you chose as well as exercise and nutrition goals. Our goal is to help you keep on track towards obtaining and maintaining your goals. We will be discussing your progress on these goals with you throughout the program.  Initial Exercise Prescription:     Initial Exercise Prescription - 02/11/17 1600      Date of Initial Exercise RX and Referring Provider   Date 02/11/17   Referring Provider [facility]     Oxygen   Oxygen Continuous   Liters 6     Treadmill   MPH 2   Grade 0   Minutes 8  2 min intervals x4   METs 2.53     Arm Ergometer   Level 2   Watts 29   RPM 25   Minutes 15   METs 2     REL-XR   Level 1   Speed 50   Minutes 15   METs 2     Prescription Details   Frequency (times per week) 3   Duration Progress to 45 minutes of aerobic exercise without signs/symptoms of physical distress     Intensity   THRR 40-80% of Max Heartrate 115-135   Ratings of Perceived Exertion 11-13   Perceived Dyspnea 0-4     Progression   Progression Continue to progress workloads to maintain intensity without signs/symptoms of physical distress.     Resistance Training   Training Prescription Yes   Weight 4 lbs   Reps 10-15      Exercise Goals: Frequency: Be able to perform aerobic exercise three times per week working toward 3-5 days per week.  Intensity: Work with a perceived exertion of 11 (fairly light) - 15 (hard) as tolerated. Follow your new exercise prescription and watch for changes in prescription as you progress with the program. Changes will be reviewed with you when they are made.  Duration: You should be able to do 30 minutes of continuous aerobic exercise in addition to a 5 minute warm-up and a 5 minute cool-down routine.  Nutrition Goals: Your personal nutrition goals will be  established when you do your nutrition analysis with the dietician.  The following are nutrition guidelines to follow: Cholesterol < 200mg /day Sodium < 1500mg /day Fiber: Men over 50 yrs - 30 grams per day  Personal Goals:     Personal Goals and Risk Factors at Admission - 02/11/17 1515      Core Components/Risk Factors/Patient Goals on Admission    Weight Management Yes;Weight Loss   Intervention Weight Management: Develop a combined nutrition and exercise program designed to reach desired caloric intake, while maintaining appropriate intake of nutrient and fiber, sodium and fats, and appropriate energy expenditure required for the weight goal.;Weight Management: Provide education and appropriate resources to help participant work on and attain dietary goals.;Weight Management/Obesity: Establish reasonable short term and long term weight goals.   Admit Weight 202 lb 4.8 oz (91.8 kg)   Goal Weight: Short Term 197 lb (89.4 kg)   Goal Weight: Long Term 192 lb (87.1 kg)   Expected Outcomes Short Term: Continue to assess and modify interventions until short term weight is achieved;Long Term: Adherence to nutrition and physical activity/exercise program aimed toward attainment of established weight goal;Weight Loss: Understanding of general recommendations for a balanced deficit meal plan, which promotes 1-2 lb weight loss per  week and includes a negative energy balance of [phone] kcal/d;Understanding recommendations for meals to include 15-35% energy as protein, 25-35% energy from fat, 35-60% energy from carbohydrates, less than 200mg  of dietary cholesterol, 20-35 gm of total fiber daily;Understanding of distribution of calorie intake throughout the day with the consumption of 4-5 meals/snacks   Hypertension Yes   Intervention Provide education on lifestyle modifcations including regular physical activity/exercise, weight management, moderate sodium restriction and increased consumption of fresh  fruit, vegetables, and low fat dairy, alcohol moderation, and smoking cessation.;Monitor prescription use compliance.   Expected Outcomes Short Term: Continued assessment and intervention until BP is < 140/74mm HG in hypertensive participants. < 130/89mm HG in hypertensive participants with diabetes, heart failure or chronic kidney disease.;Long Term: Maintenance of blood pressure at goal levels.   Lipids Yes   Intervention Provide education and support for participant on nutrition & aerobic/resistive exercise along with prescribed medications to achieve LDL 70mg , HDL >40mg .   Expected Outcomes Short Term: Participant states understanding of desired cholesterol values and is compliant with medications prescribed. Participant is following exercise prescription and nutrition guidelines.;Long Term: Cholesterol controlled with medications as prescribed, with individualized exercise RX and with personalized nutrition plan. Value goals: LDL < 70mg , HDL > 40 mg.      Tobacco Use Initial Evaluation: History  Smoking Status  . Former Smoker  . Packs/day: 3.00  . Years: 30.00  . Types: Cigarettes  Smokeless Tobacco  . Never Used    Comment: Quit over 35 yrs ago    Copy of goals given to participant.

## 2017-02-11 NOTE — Progress Notes (Signed)
Daily Session Note  Patient Details  Name: Darin Lopez MRN: [id number] Date of Birth: 1940/10/18 Referring Provider:     Pulmonary Rehab from 02/11/2017 in [place] and [facility]  Referring Provider  [facility]      Encounter Date: 02/11/2017  Check In:     Session Check In - 02/11/17 1434      Check-In   Location [facility]   Staff Present Alberteen Sam, MA, ACSM RCEP, Exercise Physiologist;Maurita Havener Flavia Shipper   Supervising physician immediately available to respond to emergencies [facility] immediately available ER MD   Physician(s) Dr.Yao and Siadecki   Medication changes reported     No   Fall or balance concerns reported    No   Tobacco Cessation --  does not smoke currently   Warm-up and Cool-down Not performed (comment)  Medical Evaluation    Resistance Training Performed No   VAD Patient? No     Pain Assessment   Currently in Pain? No/denies   Multiple Pain Sites No           Exercise Prescription Changes - 02/11/17 1600      Response to Exercise   Blood Pressure (Admit) 126/66   Blood Pressure (Exercise) 132/74   Blood Pressure (Exit) 126/56   Heart Rate (Admit) 95 bpm   Heart Rate (Exercise) 125 bpm   Heart Rate (Exit) 58 bpm   Oxygen Saturation (Admit) 92 %   Oxygen Saturation (Exercise) 78 %   Oxygen Saturation (Exit) 95 %   Rating of Perceived Exertion (Exercise) 15   Perceived Dyspnea (Exercise) 3.5   Symptoms SOB   Comments walk test results      History  Smoking Status  . Former Smoker  . Packs/day: 3.00  . Years: 30.00  . Types: Cigarettes  Smokeless Tobacco  . Never Used    Comment: Quit over 35 yrs ago    Goals Met:  Exercise tolerated well Personal goals reviewed Queuing for purse lip breathing No report of cardiac concerns or symptoms  Goals Unmet:  Not Applicable  Comments: Medical Evaluation   Dr. Emily Filbert is Medical Director for [place] and  [facility].

## 2017-02-11 NOTE — Progress Notes (Signed)
Pulmonary Individual Treatment Plan  Patient Details  Name: Darin Lopez MRN: [id number] Date of Birth: January 23, 1941 Referring Provider:     Pulmonary Rehab from 02/11/2017 in [facility]  Referring Provider  [facility]      Initial Encounter Date:    Pulmonary [facility]hab [facility]11/[facility]  Date  02/11/17  Referring Provider  [facility]      Visit Diagnosis: Chronic obstructive pulmonary disease, unspecified COPD type ([place])  Patient's Home Medications on Admission:  Current Outpatient Prescriptions:  .  acidophilus (RISAQUAD) CAPS capsule, Take 1 capsule by mouth daily., Disp: , Rfl:  .  albuterol (PROVENTIL HFA;VENTOLIN HFA) 108 (90 Base) MCG/ACT inhaler, Inhale 2 puffs into the lungs every 6 (six) hours as needed for wheezing or shortness of breath., Disp: 1 Inhaler, Rfl: 2 .  benzonatate (TESSALON PERLES) 100 MG capsule, Take 1 capsule (100 mg total) by mouth 3 (three) times daily as needed for cough., Disp: 20 capsule, Rfl: 0 .  Budesonide (UCERIS) 9 MG TB24, Take 9 mg by mouth daily., Disp: , Rfl:  .  budesonide-formoterol (SYMBICORT) 160-4.5 MCG/ACT inhaler, Inhale 2 puffs into the lungs 2 (two) times daily., Disp: , Rfl:  .  cephALEXin (KEFLEX) 500 MG capsule, Take 1 capsule (500 mg total) by mouth every 12 (twelve) hours., Disp: 10 capsule, Rfl: 0 .  chlorpheniramine-HYDROcodone (TUSSIONEX) 10-8 MG/5ML SUER, Take 5 mLs by mouth every 12 (twelve) hours as needed for cough., Disp: 115 mL, Rfl: 0 .  Coenzyme Q10 (CO Q 10) 60 MG CAPS, Take 60 mg by mouth daily., Disp: , Rfl:  .  diphenhydrAMINE (BENADRYL) 25 MG tablet, Take 25 mg by mouth at bedtime as needed for allergies or sleep. , Disp: , Rfl:  .  gabapentin (NEURONTIN) 300 MG capsule, Take 300 mg by mouth at bedtime. , Disp: , Rfl:  .  mercaptopurine (PURINETHOL) 50 MG tablet, Take 100 mg by mouth daily., Disp: , Rfl:  .  mesalamine (PENTASA) 250 MG CR capsule, Take 1,000 mg  by mouth daily. , Disp: , Rfl:  .  Multiple Vitamin (MULTIVITAMIN WITH MINERALS) TABS tablet, Take 1 tablet by mouth daily., Disp: , Rfl:  .  nitroGLYCERIN (NITROSTAT) 0.4 MG SL tablet, Place 0.4 mg under the tongue every 5 (five) minutes as needed for chest pain., Disp: , Rfl:  .  omeprazole (PRILOSEC) 20 MG capsule, Take 20 mg by mouth daily., Disp: , Rfl:  .  pravastatin (PRAVACHOL) 20 MG tablet, Take 20 mg by mouth at bedtime. , Disp: , Rfl:  .  predniSONE (DELTASONE) 10 MG tablet, Take 10 mg by mouth daily., Disp: , Rfl:  .  predniSONE (DELTASONE) 5 MG tablet, 40 mg po daily for 2 days, 20 mg po daily for 2 days, then continue home dose 10 mg po daily., Disp: 30 tablet, Rfl: 0 .  salmeterol (SEREVENT) 50 MCG/DOSE diskus inhaler, Inhale 1 puff into the lungs daily., Disp: , Rfl:  .  sertraline (ZOLOFT) 50 MG tablet, Take 100 mg by mouth daily. , Disp: , Rfl:  .  terazosin (HYTRIN) 10 MG capsule, Take 20 mg by mouth at bedtime. , Disp: , Rfl:  .  tiotropium (SPIRIVA) 18 MCG inhalation capsule, Place 1 capsule (18 mcg total) into inhaler and inhale daily., Disp: 30 capsule, Rfl: 2 .  zolpidem (AMBIEN) 10 MG tablet, Take 5-10 mg by mouth at bedtime as needed for sleep. , Disp: , Rfl:  Past Medical History: Past Medical History:  Diagnosis Date  . Colitis   . COPD (chronic obstructive pulmonary disease) ([place])   . Depression   . Emphysema   . Emphysema of lung ([place])   . GERD (gastroesophageal reflux disease)   . Heart murmur    "all my life"  . Hypertension    Hx - denies current issues  . On home oxygen therapy    4L - continuous  . Shortness of breath dyspnea   . Sleep apnea    CPAP  . Wears dentures    full upper and lower    Tobacco Use: History  Smoking Status  . Former Smoker  . Packs/day: 3.00  . Years: 30.00  . Types: Cigarettes  Smokeless Tobacco  . Never Used    Comment: Quit over 35 yrs ago    Labs: Recent Review Flowsheet Data    There is no flowsheet data  to display.       ADL UCSD:     Pulmonary Assessment Scores    Row Name 02/11/17 1448         ADL UCSD   ADL Phase Entry     SOB Score total 76     Rest 1     Walk 2     Stairs 4     Bath 2     Dress 2     Shop 4       CAT Score   CAT Score 30       mMRC Score   mMRC Score 3        Pulmonary Function Assessment:     Pulmonary Function Assessment - 02/11/17 1507      Initial Spirometry Results   FVC% 59 %   FEV1% 51 %   FEV1/FVC Ratio 63.43     Post Bronchodilator Spirometry Results   FVC% 64.31 %   FEV1% 58.14 %   FEV1/FVC Ratio 66.62     Breath   Bilateral Breath Sounds Clear;Decreased   Shortness of Breath Limiting activity;Yes      Exercise Target Goals: Date: 02/11/17  Exercise Program Goal: Individual exercise prescription set with THRR, safety & activity barriers. Participant demonstrates ability to understand and report RPE using BORG scale, to self-measure pulse accurately, and to acknowledge the importance of the exercise prescription.  Exercise Prescription Goal: Starting with aerobic activity 30 plus minutes a day, 3 days per week for initial exercise prescription. Provide home exercise prescription and guidelines that participant acknowledges understanding prior to discharge.  Activity Barriers & Risk Stratification:     Activity Barriers & Cardiac Risk Stratification - 02/11/17 1617      Activity Barriers & Cardiac Risk Stratification   Activity Barriers Deconditioning;Muscular Weakness;Shortness of Breath      6 Minute Walk:     6 Minute Walk    Row Name 02/11/17 1612         6 Minute Walk   Phase Initial     Distance 722 feet     Walk Time 3.58 minutes     # of Rest Breaks 0  test stopped at 3:35 due to desaturation     MPH 2.29     METS 1.98     RPE 15     Perceived Dyspnea  3.5     VO2 Peak 6.93     Symptoms Yes (comment)     Comments SOB     Resting HR 95 bpm  Resting BP 126/66     Max Ex. HR 120 bpm      Max Ex. BP 132/74     2 Minute Post BP 126/56       Interval HR   Baseline HR 95     1 Minute HR 120     2 Minute HR 121     3 Minute HR 125     4 Minute HR 115  test stopped at 3:35 HR was 110     5 Minute HR 94     6 Minute HR 58     Interval Heart Rate? Yes       Interval Oxygen   Interval Oxygen? Yes     Baseline Oxygen Saturation % 92 %     Baseline Liters of Oxygen 4 L  pulsed     1 Minute Oxygen Saturation % 87 %     1 Minute Liters of Oxygen 4 L     2 Minute Oxygen Saturation % 83 %     2 Minute Liters of Oxygen 4 L     3 Minute Oxygen Saturation % 79 %     3 Minute Liters of Oxygen 4 L     4 Minute Oxygen Saturation % 80 %  test stopped at 3:35 SaO2 was 78%     4 Minute Liters of Oxygen 4 L     5 Minute Oxygen Saturation % 90 %     5 Minute Liters of Oxygen 4 L     6 Minute Oxygen Saturation % 95 %     6 Minute Liters of Oxygen 4 L       Oxygen Initial Assessment:     Oxygen Initial Assessment - 02/11/17 1458      Home Oxygen   Home Oxygen Device Liquid Oxygen;Home Concentrator   Sleep Oxygen Prescription CPAP   Liters per minute 5   Home Exercise Oxygen Prescription Continuous   Liters per minute 5   Home at Rest Exercise Oxygen Prescription Continuous   Liters per minute 5   Compliance with Home Oxygen Use Yes     Initial 6 min Walk   Oxygen Used Pulsed;Liquid Oxygen   Liters per minute 4   Resting Oxygen Saturation  during 6 min walk 92 %   Exercise Oxygen Saturation  during 6 min walk 78 %     Program Oxygen Prescription   Program Oxygen Prescription Continuous;E-Tanks   Liters per minute 6   Comments Still desaturated during walk on 6L.  We will have the patient start with intermitten exercise, especially on the treadmill.      Intervention   Short Term Goals To learn and exhibit compliance with exercise, home and travel O2 prescription;To Learn and understand importance of maintaining oxygen saturations>88%;To learn and understand  importance of monitoring SPO2 with pulse oximeter and demonstrate accurate use of the pulse oximeter.;To learn and demonstrate proper purse lipped breathing techniques or other breathing techniques.;To learn and demonstrate proper use of respiratory medications   Long  Term Goals Exhibits compliance with exercise, home and travel O2 prescription;Maintenance of O2 saturations>88%;Compliance with respiratory medication;Demonstrates proper use of MDI's;Exhibits proper breathing techniques, such as purse lipped breathing or other method taught during program session;Verbalizes importance of monitoring SPO2 with pulse oximeter and return demonstration      Oxygen Re-Evaluation:   Oxygen Discharge (Final Oxygen Re-Evaluation):   Initial Exercise Prescription:     Initial Exercise Prescription - 02/11/17 1600  Date of Initial Exercise RX and Referring Provider   Date 02/11/17   Referring Provider [facility]     Oxygen   Oxygen Continuous   Liters 6     Treadmill   MPH 2   Grade 0   Minutes 8  2 min intervals x4   METs 2.53     Arm Ergometer   Level 2   Watts 29   RPM 25   Minutes 15   METs 2     REL-XR   Level 1   Speed 50   Minutes 15   METs 2     Prescription Details   Frequency (times per week) 3   Duration Progress to 45 minutes of aerobic exercise without signs/symptoms of physical distress     Intensity   THRR 40-80% of Max Heartrate 115-135   Ratings of Perceived Exertion 11-13   Perceived Dyspnea 0-4     Progression   Progression Continue to progress workloads to maintain intensity without signs/symptoms of physical distress.     Resistance Training   Training Prescription Yes   Weight 4 lbs   Reps 10-15      Perform Capillary Blood Glucose checks as needed.  Exercise Prescription Changes:     Exercise Prescription Changes    Row Name 02/11/17 1600             Response to Exercise   Blood Pressure (Admit) 126/66       Blood  Pressure (Exercise) 132/74       Blood Pressure (Exit) 126/56       Heart Rate (Admit) 95 bpm       Heart Rate (Exercise) 125 bpm       Heart Rate (Exit) 58 bpm       Oxygen Saturation (Admit) 92 %       Oxygen Saturation (Exercise) 78 %       Oxygen Saturation (Exit) 95 %       Rating of Perceived Exertion (Exercise) 15       Perceived Dyspnea (Exercise) 3.5       Symptoms SOB       Comments walk test results          Exercise Comments:     Exercise Comments    Row Name 02/11/17 1617           Exercise Comments Also completed walk test on 6L oxygen.  Test stopped at 3:15 when saturations dropped to 83%.  1 min 93%, 2 min 87%, 3 min 83%, 4 min 94% (seated).  We will start his exercise as no continuous.  He will start with 2 min intervals especially on the treadmill.  We will see how he is able to handle seated exercise to determine if intermitten exercise is needed on there as well.           Exercise Goals and Review:     Exercise Goals    Row Name 02/11/17 1623             Exercise Goals   Increase Physical Activity Yes       Intervention Provide advice, education, support and counseling about physical activity/exercise needs.;Develop an individualized exercise prescription for aerobic and resistive training based on initial evaluation findings, risk stratification, comorbidities and participant's personal goals.       Expected Outcomes Achievement of increased cardiorespiratory fitness and enhanced flexibility, muscular endurance and strength shown through measurements of functional capacity and personal statement  of participant.       Increase Strength and Stamina Yes       Intervention Provide advice, education, support and counseling about physical activity/exercise needs.;Develop an individualized exercise prescription for aerobic and resistive training based on initial evaluation findings, risk stratification, comorbidities and participant's personal goals.        Expected Outcomes Achievement of increased cardiorespiratory fitness and enhanced flexibility, muscular endurance and strength shown through measurements of functional capacity and personal statement of participant.          Exercise Goals Re-Evaluation :   Discharge Exercise Prescription (Final Exercise Prescription Changes):     Exercise Prescription Changes - 02/11/17 1600      Response to Exercise   Blood Pressure (Admit) 126/66   Blood Pressure (Exercise) 132/74   Blood Pressure (Exit) 126/56   Heart Rate (Admit) 95 bpm   Heart Rate (Exercise) 125 bpm   Heart Rate (Exit) 58 bpm   Oxygen Saturation (Admit) 92 %   Oxygen Saturation (Exercise) 78 %   Oxygen Saturation (Exit) 95 %   Rating of Perceived Exertion (Exercise) 15   Perceived Dyspnea (Exercise) 3.5   Symptoms SOB   Comments walk test results      Nutrition:  Target Goals: Understanding of nutrition guidelines, daily intake of sodium 1500mg , cholesterol 200mg , calories 30% from fat and 7% or less from saturated fats, daily to have 5 or more servings of fruits and vegetables.  Biometrics:     Pre Biometrics - 02/11/17 1623      Pre Biometrics   Height 6' 0.4" (1.839 m)   Weight 202 lb 4.8 oz (91.8 kg)   Waist Circumference 42.5 inches   Hip Circumference 39 inches   Waist to Hip Ratio 1.09 %   BMI (Calculated) 27.2       Nutrition Therapy Plan and Nutrition Goals:     Nutrition Therapy & Goals - 02/11/17 1447      Nutrition Therapy   RD appointment defered Yes     Intervention Plan   Intervention Prescribe, educate and counsel regarding individualized specific dietary modifications aiming towards targeted core components such as weight, hypertension, lipid management, diabetes, heart failure and other comorbidities.;Nutrition handout(s) given to patient.   Expected Outcomes Short Term Goal: Understand basic principles of dietary content, such as calories, fat, sodium, cholesterol and  nutrients.;Short Term Goal: A plan has been developed with personal nutrition goals set during dietitian appointment.;Long Term Goal: Adherence to prescribed nutrition plan.      Nutrition Discharge: Rate Your Plate Scores:   Nutrition Goals Re-Evaluation:   Nutrition Goals Discharge (Final Nutrition Goals Re-Evaluation):   Psychosocial: Target Goals: Acknowledge presence or absence of significant depression and/or stress, maximize coping skills, provide positive support system. Participant is able to verbalize types and ability to use techniques and skills needed for reducing stress and depression.   Initial Review & Psychosocial Screening:     Initial Psych Review & Screening - 02/11/17 1446      Initial Review   Current issues with None Identified     Family Dynamics   Good Support System? Yes     Barriers   Psychosocial barriers to participate in program There are no identifiable barriers or psychosocial needs.     Screening Interventions   Interventions Encouraged to exercise;To provide support and resources with identified psychosocial needs;Yes   Expected Outcomes Short Term goal: Utilizing psychosocial counselor, staff and physician to assist with identification of specific Stressors or current  issues interfering with healing process. Setting desired goal for each stressor or current issue identified.;Long Term Goal: Stressors or current issues are controlled or eliminated.;Short Term goal: Identification and review with participant of any Quality of Life or Depression concerns found by scoring the questionnaire.;Long Term goal: The participant improves quality of Life and PHQ9 Scores as seen by post scores and/or verbalization of changes      Quality of Life Scores:   PHQ-9: Recent Review Flowsheet Data    Depression screen [facility] 2/9 02/11/2017   Decreased Interest 2   Down, Depressed, Hopeless 2   PHQ - 2 Score 4   Altered sleeping 0   Tired, decreased energy 2    Change in appetite 0   Feeling bad or failure about yourself  0   Trouble concentrating 0   Moving slowly or fidgety/restless 0   Suicidal thoughts 0   PHQ-9 Score 6   Difficult doing work/chores Somewhat difficult     Interpretation of Total Score  Total Score Depression Severity:  1-4 = Minimal depression, 5-9 = Mild depression, 10-14 = Moderate depression, 15-19 = Moderately severe depression, 20-27 = Severe depression   Psychosocial Evaluation and Intervention:   Psychosocial Re-Evaluation:   Psychosocial Discharge (Final Psychosocial Re-Evaluation):   Education: Education Goals: Education classes will be provided on a weekly basis, covering required topics. Participant will state understanding/return demonstration of topics presented.  Learning Barriers/Preferences:     Learning Barriers/Preferences - 02/11/17 1510      Learning Barriers/Preferences   Learning Barriers Sight   Learning Preferences Individual Instruction;Skilled Demonstration;Verbal Instruction      Education Topics: Initial Evaluation Education: - Verbal, written and demonstration of respiratory meds, RPE/PD scales, oximetry and breathing techniques. Instruction on use of nebulizers and MDIs: cleaning and proper use, rinsing mouth with steroid doses and importance of monitoring MDI activations.   General Nutrition Guidelines/Fats and Fiber: -Group instruction provided by verbal, written material, models and posters to present the general guidelines for heart healthy nutrition. Gives an explanation and review of dietary fats and fiber.   Controlling Sodium/Reading Food Labels: -Group verbal and written material supporting the discussion of sodium use in heart healthy nutrition. Review and explanation with models, verbal and written materials for utilization of the food label.   Exercise Physiology & Risk Factors: - Group verbal and written instruction with models to review the exercise physiology  of the cardiovascular system and associated critical values. Details cardiovascular disease risk factors and the goals associated with each risk factor.   Aerobic Exercise & Resistance Training: - Gives group verbal and written discussion on the health impact of inactivity. On the components of aerobic and resistive training programs and the benefits of this training and how to safely progress through these programs.   Flexibility, Balance, General Exercise Guidelines: - Provides group verbal and written instruction on the benefits of flexibility and balance training programs. Provides general exercise guidelines with specific guidelines to those with heart or lung disease. Demonstration and skill practice provided.   Stress Management: - Provides group verbal and written instruction about the health risks of elevated stress, cause of high stress, and healthy ways to reduce stress.   Depression: - Provides group verbal and written instruction on the correlation between heart/lung disease and depressed mood, treatment options, and the stigmas associated with seeking treatment.   Exercise & Equipment Safety: - Individual verbal instruction and demonstration of equipment use and safety with use of the equipment.   Infection Prevention: -  Provides verbal and written material to individual with discussion of infection control including proper hand washing and proper equipment cleaning during exercise session.   Falls Prevention: - Provides verbal and written material to individual with discussion of falls prevention and safety.   Diabetes: - Individual verbal and written instruction to review signs/symptoms of diabetes, desired ranges of glucose level fasting, after meals and with exercise. Advice that pre and post exercise glucose checks will be done for 3 sessions at entry of program.   Chronic Lung Diseases: - Group verbal and written instruction to review new updates, new  respiratory medications, new advancements in procedures and treatments. Provide informative websites and "800" numbers of self-education.   Lung Procedures: - Group verbal and written instruction to describe testing methods done to diagnose lung disease. Review the outcome of test results. Describe the treatment choices: Pulmonary Function Tests, ABGs and oximetry.   Energy Conservation: - Provide group verbal and written instruction for methods to conserve energy, plan and organize activities. Instruct on pacing techniques, use of adaptive equipment and posture/positioning to relieve shortness of breath.   Triggers: - Group verbal and written instruction to review types of environmental controls: home humidity, furnaces, filters, dust mite/pet prevention, HEPA vacuums. To discuss weather changes, air quality and the benefits of nasal washing.   Exacerbations: - Group verbal and written instruction to provide: warning signs, infection symptoms, calling MD promptly, preventive modes, and value of vaccinations. Review: effective airway clearance, coughing and/or vibration techniques. Create an Sports administrator.   Oxygen: - Individual and group verbal and written instruction on oxygen therapy. Includes supplement oxygen, available portable oxygen systems, continuous and intermittent flow rates, oxygen safety, concentrators, and Medicare reimbursement for oxygen.   Respiratory Medications: - Group verbal and written instruction to review medications for lung disease. Drug class, frequency, complications, importance of spacers, rinsing mouth after steroid MDI's, and proper cleaning methods for nebulizers.   AED/CPR: - Group verbal and written instruction with the use of models to demonstrate the basic use of the AED with the basic ABC's of resuscitation.   Breathing Retraining: - Provides individuals verbal and written instruction on purpose, frequency, and proper technique of diaphragmatic  breathing and pursed-lipped breathing. Applies individual practice skills.   Anatomy and Physiology of the Lungs: - Group verbal and written instruction with the use of models to provide basic lung anatomy and physiology related to function, structure and complications of lung disease.   Heart Failure: - Group verbal and written instruction on the basics of heart failure: signs/symptoms, treatments, explanation of ejection fraction, enlarged heart and cardiomyopathy.   Sleep Apnea: - Individual verbal and written instruction to review Obstructive Sleep Apnea. Review of risk factors, methods for diagnosing and types of masks and machines for OSA.   Anxiety: - Provides group, verbal and written instruction on the correlation between heart/lung disease and anxiety, treatment options, and management of anxiety.   Relaxation: - Provides group, verbal and written instruction about the benefits of relaxation for patients with heart/lung disease. Also provides patients with examples of relaxation techniques.   Knowledge Questionnaire Score:     Knowledge Questionnaire Score - 02/11/17 1448      Knowledge Questionnaire Score   Pre Score 7/10       Core Components/Risk Factors/Patient Goals at Admission:     Personal Goals and Risk Factors at Admission - 02/11/17 1515      Core Components/Risk Factors/Patient Goals on Admission    Weight Management Yes;Weight Loss  Intervention Weight Management: Develop a combined nutrition and exercise program designed to reach desired caloric intake, while maintaining appropriate intake of nutrient and fiber, sodium and fats, and appropriate energy expenditure required for the weight goal.;Weight Management: Provide education and appropriate resources to help participant work on and attain dietary goals.;Weight Management/Obesity: Establish reasonable short term and long term weight goals.   Admit Weight 202 lb 4.8 oz (91.8 kg)   Goal Weight:  Short Term 197 lb (89.4 kg)   Goal Weight: Long Term 192 lb (87.1 kg)   Expected Outcomes Short Term: Continue to assess and modify interventions until short term weight is achieved;Long Term: Adherence to nutrition and physical activity/exercise program aimed toward attainment of established weight goal;Weight Loss: Understanding of general recommendations for a balanced deficit meal plan, which promotes 1-2 lb weight loss per week and includes a negative energy balance of [phone] kcal/d;Understanding recommendations for meals to include 15-35% energy as protein, 25-35% energy from fat, 35-60% energy from carbohydrates, less than 200mg  of dietary cholesterol, 20-35 gm of total fiber daily;Understanding of distribution of calorie intake throughout the day with the consumption of 4-5 meals/snacks   Hypertension Yes   Intervention Provide education on lifestyle modifcations including regular physical activity/exercise, weight management, moderate sodium restriction and increased consumption of fresh fruit, vegetables, and low fat dairy, alcohol moderation, and smoking cessation.;Monitor prescription use compliance.   Expected Outcomes Short Term: Continued assessment and intervention until BP is < 140/33mm HG in hypertensive participants. < 130/23mm HG in hypertensive participants with diabetes, heart failure or chronic kidney disease.;Long Term: Maintenance of blood pressure at goal levels.   Lipids Yes   Intervention Provide education and support for participant on nutrition & aerobic/resistive exercise along with prescribed medications to achieve LDL 70mg , HDL >40mg .   Expected Outcomes Short Term: Participant states understanding of desired cholesterol values and is compliant with medications prescribed. Participant is following exercise prescription and nutrition guidelines.;Long Term: Cholesterol controlled with medications as prescribed, with individualized exercise RX and with personalized nutrition  plan. Value goals: LDL < 70mg , HDL > 40 mg.      Core Components/Risk Factors/Patient Goals Review:    Core Components/Risk Factors/Patient Goals at Discharge (Final Review):    ITP Comments:     ITP Comments    Row Name 02/11/17 1651           ITP Comments Medical Evaluation Completed. Chart sent for signature and review to Dr. Emily Filbert Director of [place]. Visit diagnosis can be found in [facility] encounter 02/11/17          Comments: Initial ITP

## 2017-02-19 DIAGNOSIS — J449 Chronic obstructive pulmonary disease, unspecified: Secondary | ICD-10-CM | POA: Diagnosis not present

## 2017-02-19 NOTE — Progress Notes (Signed)
Daily Session Note  Patient Details  Name: NASSIR NEIDERT MRN: [id number] Date of Birth: [birth year] Referring Provider:     Pulmonary Rehab from 02/11/2017 in [place] and Pulmonary Rehab  Referring Provider  [facility]      Encounter Date: 02/19/2017  Check In:     Session Check In - 02/19/17 1122      Check-In   Location [facility]-Card[facility] & Pulmonary Rehab   Staff Present Alberteen Sam, MA, ACSM RCEP, Exercise Physiologist;Joseph Alcus Dad, RN BSN   Supervising physician immediately available to respond to emergencies [facility] immediately available ER MD   Physician(s) Dr. Mariea Clonts and Jimmye Norman   Medication changes reported     No   Fall or balance concerns reported    No   Warm-up and Cool-down Performed as group-led instruction   Resistance Training Performed Yes   VAD Patient? No     Pain Assessment   Currently in Pain? No/denies   Multiple Pain Sites No         History  Smoking Status  . Former Smoker  . Packs/day: 3.00  . Years: 30.00  . Types: Cigarettes  Smokeless Tobacco  . Never Used    Comment: Quit over 35 yrs ago    Goals Met:  Exercise tolerated well No report of cardiac concerns or symptoms Strength training completed today  Goals Unmet:  Not Applicable  Comments: First full day of exercise!  Patient was oriented to gym and equipment including functions, settings, policies, and procedures.  Patient's individual exercise prescription and treatment plan were reviewed.  All starting workloads were established based on the results of the 6 minute walk test done at initial orientation visit.  The plan for exercise progression was also introduced and progression will be customized based on patient's performance and goals.   Dr. Emily Filbert is Medical Director for [place] and [facility].

## 2017-02-20 DIAGNOSIS — K51019 Ulcerative (chronic) pancolitis with unspecified complications: Secondary | ICD-10-CM | POA: Diagnosis not present

## 2017-02-20 DIAGNOSIS — Z79899 Other long term (current) drug therapy: Secondary | ICD-10-CM | POA: Diagnosis not present

## 2017-02-21 DIAGNOSIS — J449 Chronic obstructive pulmonary disease, unspecified: Secondary | ICD-10-CM | POA: Diagnosis not present

## 2017-02-21 NOTE — Progress Notes (Signed)
Daily Session Note  Patient Details  Name: Darin Lopez MRN: [id number] Date of Birth: 12-Dec-1940 Referring Provider:     Pulmonary Rehab from 02/11/2017 in [place] and [facility]  Referring Provider  [facility]      Encounter Date: 02/21/2017  Check In:     Session Check In - 02/21/17 1147      Check-In   Location [facility]   Staff Present Justin Mend RCP,RRT,BSRT;[name], RN BSN;Jessica Luan Pulling, MA, ACSM RCEP, Exercise Physiologist   Supervising physician immediately available to respond to emergencies [facility] immediately available ER MD   Physician(s) Dr. Burlene Arnt and Alfred Levins   Medication changes reported     No   Fall or balance concerns reported    No   Warm-up and Cool-down Performed as group-led instruction   Resistance Training Performed Yes   VAD Patient? No     Pain Assessment   Currently in Pain? No/denies   Multiple Pain Sites No         History  Smoking Status  . Former Smoker  . Packs/day: 3.00  . Years: 30.00  . Types: Cigarettes  Smokeless Tobacco  . Never Used    Comment: Quit over 35 yrs ago    Goals Met:  Proper associated with RPD/PD & O2 Sat [place] with exercise equipment Exercise tolerated well No report of cardiac concerns or symptoms Strength training completed today  Goals Unmet:  Not Applicable  Comments: Pt able to follow exercise prescription today without complaint.  Will continue to monitor for progression. Patient came in today with spo2 in the low 80's. Informed patient that he may need 4 liters continuous instead of pulsed oxygen. Worked with patient on PLB techniques. He recovers when he rest but requires 6 liters when exercising.     Dr. Emily Filbert is Medical Director for [place] and [facility].

## 2017-02-24 DIAGNOSIS — J449 Chronic obstructive pulmonary disease, unspecified: Secondary | ICD-10-CM

## 2017-02-24 NOTE — Progress Notes (Signed)
Pulmonary Individual Treatment Plan  Patient Details  Name: Darin Lopez MRN: [id number] Date of Birth: January 23, 1941 Referring Provider:     Pulmonary Rehab from 02/11/2017 in [facility]  Referring Provider  [facility]      Initial Encounter Date:    [facility]  Date  02/11/17  Referring Provider  [facility]      Visit Diagnosis: Chronic obstructive pulmonary disease, unspecified COPD type ([place])  Patient's Home Medications on Admission:  Current Outpatient Prescriptions:  .  acidophilus (RISAQUAD) CAPS capsule, Take 1 capsule by mouth daily., Disp: , Rfl:  .  albuterol (PROVENTIL HFA;VENTOLIN HFA) 108 (90 Base) MCG/ACT inhaler, Inhale 2 puffs into the lungs every 6 (six) hours as needed for wheezing or shortness of breath., Disp: 1 Inhaler, Rfl: 2 .  benzonatate (TESSALON PERLES) 100 MG capsule, Take 1 capsule (100 mg total) by mouth 3 (three) times daily as needed for cough., Disp: 20 capsule, Rfl: 0 .  Budesonide (UCERIS) 9 MG TB24, Take 9 mg by mouth daily., Disp: , Rfl:  .  budesonide-formoterol (SYMBICORT) 160-4.5 MCG/ACT inhaler, Inhale 2 puffs into the lungs 2 (two) times daily., Disp: , Rfl:  .  cephALEXin (KEFLEX) 500 MG capsule, Take 1 capsule (500 mg total) by mouth every 12 (twelve) hours., Disp: 10 capsule, Rfl: 0 .  chlorpheniramine-HYDROcodone (TUSSIONEX) 10-8 MG/5ML SUER, Take 5 mLs by mouth every 12 (twelve) hours as needed for cough., Disp: 115 mL, Rfl: 0 .  Coenzyme Q10 (CO Q 10) 60 MG CAPS, Take 60 mg by mouth daily., Disp: , Rfl:  .  diphenhydrAMINE (BENADRYL) 25 MG tablet, Take 25 mg by mouth at bedtime as needed for allergies or sleep. , Disp: , Rfl:  .  gabapentin (NEURONTIN) 300 MG capsule, Take 300 mg by mouth at bedtime. , Disp: , Rfl:  .  mercaptopurine (PURINETHOL) 50 MG tablet, Take 100 mg by mouth daily., Disp: , Rfl:  .  mesalamine (PENTASA) 250 MG CR capsule, Take 1,000 mg  by mouth daily. , Disp: , Rfl:  .  Multiple Vitamin (MULTIVITAMIN WITH MINERALS) TABS tablet, Take 1 tablet by mouth daily., Disp: , Rfl:  .  nitroGLYCERIN (NITROSTAT) 0.4 MG SL tablet, Place 0.4 mg under the tongue every 5 (five) minutes as needed for chest pain., Disp: , Rfl:  .  omeprazole (PRILOSEC) 20 MG capsule, Take 20 mg by mouth daily., Disp: , Rfl:  .  pravastatin (PRAVACHOL) 20 MG tablet, Take 20 mg by mouth at bedtime. , Disp: , Rfl:  .  predniSONE (DELTASONE) 10 MG tablet, Take 10 mg by mouth daily., Disp: , Rfl:  .  predniSONE (DELTASONE) 5 MG tablet, 40 mg po daily for 2 days, 20 mg po daily for 2 days, then continue home dose 10 mg po daily., Disp: 30 tablet, Rfl: 0 .  salmeterol (SEREVENT) 50 MCG/DOSE diskus inhaler, Inhale 1 puff into the lungs daily., Disp: , Rfl:  .  sertraline (ZOLOFT) 50 MG tablet, Take 100 mg by mouth daily. , Disp: , Rfl:  .  terazosin (HYTRIN) 10 MG capsule, Take 20 mg by mouth at bedtime. , Disp: , Rfl:  .  tiotropium (SPIRIVA) 18 MCG inhalation capsule, Place 1 capsule (18 mcg total) into inhaler and inhale daily., Disp: 30 capsule, Rfl: 2 .  zolpidem (AMBIEN) 10 MG tablet, Take 5-10 mg by mouth at bedtime as needed for sleep. , Disp: , Rfl:  Past Medical History: Past Medical History:  Diagnosis Date  . Colitis   . COPD (chronic obstructive pulmonary disease) ([place])   . Depression   . Emphysema   . Emphysema of lung ([place])   . GERD (gastroesophageal reflux disease)   . Heart murmur    "all my life"  . Hypertension    Hx - denies current issues  . On home oxygen therapy    4L - continuous  . Shortness of breath dyspnea   . Sleep apnea    CPAP  . Wears dentures    full upper and lower    Tobacco Use: History  Smoking Status  . Former Smoker  . Packs/day: 3.00  . Years: 30.00  . Types: Cigarettes  Smokeless Tobacco  . Never Used    Comment: Quit over 35 yrs ago    Labs: Recent Review Flowsheet Data    There is no flowsheet data  to display.       Pulmonary Assessment Scores:     Pulmonary Assessment Scores    Row Name 02/11/17 1448         ADL UCSD   ADL Phase Entry     SOB Score total 76     Rest 1     Walk 2     Stairs 4     Bath 2     Dress 2     Shop 4       CAT Score   CAT Score 30       mMRC Score   mMRC Score 3        Pulmonary Function Assessment:     Pulmonary Function Assessment - 02/11/17 1507      Initial Spirometry Results   FVC% 59 %   FEV1% 51 %   FEV1/FVC Ratio 63.43     Post Bronchodilator Spirometry Results   FVC% 64.31 %   FEV1% 58.14 %   FEV1/FVC Ratio 66.62     Breath   Bilateral Breath Sounds Clear;Decreased   Shortness of Breath Limiting activity;Yes      Exercise Target Goals:    Exercise Program Goal: Individual exercise prescription set with THRR, safety & activity barriers. Participant demonstrates ability to understand and report RPE using BORG scale, to self-measure pulse accurately, and to acknowledge the importance of the exercise prescription.  Exercise Prescription Goal: Starting with aerobic activity 30 plus minutes a day, 3 days per week for initial exercise prescription. Provide home exercise prescription and guidelines that participant acknowledges understanding prior to discharge.  Activity Barriers & Risk Stratification:     Activity Barriers & Cardiac Risk Stratification - 02/11/17 1617      Activity Barriers & Cardiac Risk Stratification   Activity Barriers Deconditioning;Muscular Weakness;Shortness of Breath      6 Minute Walk:     6 Minute Walk    Row Name 02/11/17 1612         6 Minute Walk   Phase Initial     Distance 722 feet     Walk Time 3.58 minutes     # of Rest Breaks 0  test stopped at 3:35 due to desaturation     MPH 2.29     METS 1.98     RPE 15     Perceived Dyspnea  3.5     VO2 Peak 6.93     Symptoms Yes (comment)     Comments SOB     Resting HR 95 bpm  Resting BP 126/66     Max Ex. HR 120  bpm     Max Ex. BP 132/74     2 Minute Post BP 126/56       Interval HR   Baseline HR (retired) 95     1 Minute HR 120     2 Minute HR 121     3 Minute HR 125     4 Minute HR 115  test stopped at 3:35 HR was 110     5 Minute HR 94     6 Minute HR 58     Interval Heart Rate? Yes       Interval Oxygen   Interval Oxygen? Yes     Baseline Oxygen Saturation % 92 %     Resting Liters of Oxygen 4 L  pulsed     1 Minute Oxygen Saturation % 87 %     1 Minute Liters of Oxygen 4 L     2 Minute Oxygen Saturation % 83 %     2 Minute Liters of Oxygen 4 L     3 Minute Oxygen Saturation % 79 %     3 Minute Liters of Oxygen 4 L     4 Minute Oxygen Saturation % 80 %  test stopped at 3:35 SaO2 was 78%     4 Minute Liters of Oxygen 4 L     5 Minute Oxygen Saturation % 90 %     5 Minute Liters of Oxygen 4 L     6 Minute Oxygen Saturation % 95 %     6 Minute Liters of Oxygen 4 L       Oxygen Initial Assessment:     Oxygen Initial Assessment - 02/11/17 1458      Home Oxygen   Home Oxygen Device Liquid Oxygen;Home Concentrator   Sleep Oxygen Prescription CPAP   Liters per minute 5   Home Exercise Oxygen Prescription Continuous   Liters per minute 5   Home at Rest Exercise Oxygen Prescription Continuous   Liters per minute 5   Compliance with Home Oxygen Use Yes     Initial 6 min Walk   Oxygen Used Pulsed;Liquid Oxygen   Liters per minute 4   Resting Oxygen Saturation  92 %   Exercise Oxygen Saturation  during 6 min walk 78 %     Program Oxygen Prescription   Program Oxygen Prescription Continuous;E-Tanks   Liters per minute 6   Comments Still desaturated during walk on 6L.  We will have the patient start with intermitten exercise, especially on the treadmill.      Intervention   Short Term Goals To learn and exhibit compliance with exercise, home and travel O2 prescription;To Learn and understand importance of maintaining oxygen saturations>88%;To learn and understand  importance of monitoring SPO2 with pulse oximeter and demonstrate accurate use of the pulse oximeter.;To learn and demonstrate proper purse lipped breathing techniques or other breathing techniques.;To learn and demonstrate proper use of respiratory medications   Long  Term Goals Exhibits compliance with exercise, home and travel O2 prescription;Maintenance of O2 saturations>88%;Compliance with respiratory medication;Demonstrates proper use of MDI's;Exhibits proper breathing techniques, such as purse lipped breathing or other method taught during program session;Verbalizes importance of monitoring SPO2 with pulse oximeter and return demonstration      Oxygen Re-Evaluation:   Oxygen Discharge (Final Oxygen Re-Evaluation):   Initial Exercise Prescription:     Initial Exercise Prescription - 02/11/17 1600  Date of Initial Exercise RX and Referring Provider   Date 02/11/17   Referring Provider [facility]     Oxygen   Oxygen Continuous   Liters 6     Treadmill   MPH 2   Grade 0   Minutes 8  2 min intervals x4   METs 2.53     Arm Ergometer   Level 2   Watts 29   RPM 25   Minutes 15   METs 2     REL-XR   Level 1   Speed 50   Minutes 15   METs 2     Prescription Details   Frequency (times per week) 3   Duration Progress to 45 minutes of aerobic exercise without signs/symptoms of physical distress     Intensity   THRR 40-80% of Max Heartrate 115-135   Ratings of Perceived Exertion 11-13   Perceived Dyspnea 0-4     Progression   Progression Continue to progress workloads to maintain intensity without signs/symptoms of physical distress.     Resistance Training   Training Prescription Yes   Weight 4 lbs   Reps 10-15      Perform Capillary Blood Glucose checks as needed.  Exercise Prescription Changes:     Exercise Prescription Changes    Row Name 02/11/17 1600             Response to Exercise   Blood Pressure (Admit) 126/66       Blood  Pressure (Exercise) 132/74       Blood Pressure (Exit) 126/56       Heart Rate (Admit) 95 bpm       Heart Rate (Exercise) 125 bpm       Heart Rate (Exit) 58 bpm       Oxygen Saturation (Admit) 92 %       Oxygen Saturation (Exercise) 78 %       Oxygen Saturation (Exit) 95 %       Rating of Perceived Exertion (Exercise) 15       Perceived Dyspnea (Exercise) 3.5       Symptoms SOB       Comments walk test results          Exercise Comments:     Exercise Comments    Row Name 02/11/17 1617 02/19/17 1127         Exercise Comments Also completed walk test on 6L oxygen.  Test stopped at 3:15 when saturations dropped to 83%.  1 min 93%, 2 min 87%, 3 min 83%, 4 min 94% (seated).  We will start his exercise as no continuous.  He will start with 2 min intervals especially on the treadmill.  We will see how he is able to handle seated exercise to determine if intermitten exercise is needed on there as well.  First full day of exercise!  Patient was oriented to gym and equipment including functions, settings, policies, and procedures.  Patient's individual exercise prescription and treatment plan were reviewed.  All starting workloads were established based on the results of the 6 minute walk test done at initial orientation visit.  The plan for exercise progression was also introduced and progression will be customized based on patient's performance and goals.         Exercise Goals and Review:     Exercise Goals    Row Name 02/11/17 1623             Exercise Goals   Increase Physical  Activity Yes       Intervention Provide advice, education, support and counseling about physical activity/exercise needs.;Develop an individualized exercise prescription for aerobic and resistive training based on initial evaluation findings, risk stratification, comorbidities and participant's personal goals.       Expected Outcomes Achievement of increased cardiorespiratory fitness and enhanced flexibility,  muscular endurance and strength shown through measurements of functional capacity and personal statement of participant.       Increase Strength and Stamina Yes       Intervention Provide advice, education, support and counseling about physical activity/exercise needs.;Develop an individualized exercise prescription for aerobic and resistive training based on initial evaluation findings, risk stratification, comorbidities and participant's personal goals.       Expected Outcomes Achievement of increased cardiorespiratory fitness and enhanced flexibility, muscular endurance and strength shown through measurements of functional capacity and personal statement of participant.          Exercise Goals Re-Evaluation :   Discharge Exercise Prescription (Final Exercise Prescription Changes):     Exercise Prescription Changes - 02/11/17 1600      Response to Exercise   Blood Pressure (Admit) 126/66   Blood Pressure (Exercise) 132/74   Blood Pressure (Exit) 126/56   Heart Rate (Admit) 95 bpm   Heart Rate (Exercise) 125 bpm   Heart Rate (Exit) 58 bpm   Oxygen Saturation (Admit) 92 %   Oxygen Saturation (Exercise) 78 %   Oxygen Saturation (Exit) 95 %   Rating of Perceived Exertion (Exercise) 15   Perceived Dyspnea (Exercise) 3.5   Symptoms SOB   Comments walk test results      Nutrition:  Target Goals: Understanding of nutrition guidelines, daily intake of sodium '1500mg'$ , cholesterol '200mg'$ , calories 30% from fat and 7% or less from saturated fats, daily to have 5 or more servings of fruits and vegetables.  Biometrics:     Pre Biometrics - 02/11/17 1623      Pre Biometrics   Height 6' 0.4" (1.839 m)   Weight 202 lb 4.8 oz (91.8 kg)   Waist Circumference 42.5 inches   Hip Circumference 39 inches   Waist to Hip Ratio 1.09 %   BMI (Calculated) 27.2       Nutrition Therapy Plan and Nutrition Goals:     Nutrition Therapy & Goals - 02/11/17 1447      Nutrition Therapy   RD  appointment defered Yes     Intervention Plan   Intervention Prescribe, educate and counsel regarding individualized specific dietary modifications aiming towards targeted core components such as weight, hypertension, lipid management, diabetes, heart failure and other comorbidities.;Nutrition handout(s) given to patient.   Expected Outcomes Short Term Goal: Understand basic principles of dietary content, such as calories, fat, sodium, cholesterol and nutrients.;Short Term Goal: A plan has been developed with personal nutrition goals set during dietitian appointment.;Long Term Goal: Adherence to prescribed nutrition plan.      Nutrition Discharge: Rate Your Plate Scores:   Nutrition Goals Re-Evaluation:   Nutrition Goals Discharge (Final Nutrition Goals Re-Evaluation):   Psychosocial: Target Goals: Acknowledge presence or absence of significant depression and/or stress, maximize coping skills, provide positive support system. Participant is able to verbalize types and ability to use techniques and skills needed for reducing stress and depression.   Initial Review & Psychosocial Screening:     Initial Psych Review & Screening - 02/11/17 1446      Initial Review   Current issues with None Identified     Family Dynamics  Good Support System? Yes     Barriers   Psychosocial barriers to participate in program There are no identifiable barriers or psychosocial needs.     Screening Interventions   Interventions Encouraged to exercise;To provide support and resources with identified psychosocial needs;Yes   Expected Outcomes Short Term goal: Utilizing psychosocial counselor, staff and physician to assist with identification of specific Stressors or current issues interfering with healing process. Setting desired goal for each stressor or current issue identified.;Long Term Goal: Stressors or current issues are controlled or eliminated.;Short Term goal: Identification and review with  participant of any Quality of Life or Depression concerns found by scoring the questionnaire.;Long Term goal: The participant improves quality of Life and PHQ9 Scores as seen by post scores and/or verbalization of changes      Quality of Life Scores:   PHQ-9: Recent Review Flowsheet Data    Depression screen [facility] 2/9 02/11/2017   Decreased Interest 2   Down, Depressed, Hopeless 2   PHQ - 2 Score 4   Altered sleeping 0   Tired, decreased energy 2   Change in appetite 0   Feeling bad or failure about yourself  0   Trouble concentrating 0   Moving slowly or fidgety/restless 0   Suicidal thoughts 0   PHQ-9 Score 6   Difficult doing work/chores Somewhat difficult     Interpretation of Total Score  Total Score Depression Severity:  1-4 = Minimal depression, 5-9 = Mild depression, 10-14 = Moderate depression, 15-19 = Moderately severe depression, 20-27 = Severe depression   Psychosocial Evaluation and Intervention:     Psychosocial Evaluation - 02/19/17 1217      Psychosocial Evaluation & Interventions   Interventions Encouraged to exercise with the program and follow exercise prescription;Relaxation education   Comments Counselor met with Mr. Dais today for initial psychosocial evaluation.  He is a 76 year old who is diagnosed with COPD.  Yvone Neu has a strong support system with a spouse of 20 years and several adult children.  He is also actively involved in his local church.  Yvone Neu sleeps well and has a good appetite.  He is on medications for sleep and depression/anxiety and reports it is working well.  Yvone Neu states his mood is generally positive and he has minimal stress in his life at this time - other than his COPD.  His goals for this program are to breathe better and have a better quality of life.     Expected Outcomes Yvone Neu will benefit from consistent exercise to achieve his stated goals. The educational and psychoeducational components of this program will be helpful in Time learning more  about his illness and how to manage it better.     Continue Psychosocial Services  Follow up required by staff      Psychosocial Re-Evaluation:   Psychosocial Discharge (Final Psychosocial Re-Evaluation):   Education: Education Goals: Education classes will be provided on a weekly basis, covering required topics. Participant will state understanding/return demonstration of topics presented.  Learning Barriers/Preferences:     Learning Barriers/Preferences - 02/11/17 1510      Learning Barriers/Preferences   Learning Barriers Sight   Learning Preferences Individual Instruction;Skilled Demonstration;Verbal Instruction      Education Topics: Initial Evaluation Education: - Verbal, written and demonstration of respiratory meds, RPE/PD scales, oximetry and breathing techniques. Instruction on use of nebulizers and MDIs: cleaning and proper use, rinsing mouth with steroid doses and importance of monitoring MDI activations.   Pulmonary Rehab from 02/19/2017 in  [place] [facility]  Date  02/11/17  Educator  [facility]  Instruction Review Code (retired)  2- meets goals/outcomes      General Nutrition Guidelines/Fats and Fiber: -Group instruction provided by verbal, written material, models and posters to present the general guidelines for heart healthy nutrition. Gives an explanation and review of dietary fats and fiber.   Controlling Sodium/Reading Food Labels: -Group verbal and written material supporting the discussion of sodium use in heart healthy nutrition. Review and explanation with models, verbal and written materials for utilization of the food label.   Exercise Physiology & Risk Factors: - Group verbal and written instruction with models to review the exercise physiology of the cardiovascular system and associated critical values. Details cardiovascular disease risk factors and the goals associated with each risk factor.   Aerobic Exercise & Resistance  Training: - Gives group verbal and written discussion on the health impact of inactivity. On the components of aerobic and resistive training programs and the benefits of this training and how to safely progress through these programs.   Flexibility, Balance, General Exercise Guidelines: - Provides group verbal and written instruction on the benefits of flexibility and balance training programs. Provides general exercise guidelines with specific guidelines to those with heart or lung disease. Demonstration and skill practice provided.   Stress Management: - Provides group verbal and written instruction about the health risks of elevated stress, cause of high stress, and healthy ways to reduce stress.   Depression: - Provides group verbal and written instruction on the correlation between heart/lung disease and depressed mood, treatment options, and the stigmas associated with seeking treatment.   Pulmonary Rehab from 02/19/2017 in [facility] Cardiac and Pulmonary Rehab  Date  02/19/17  Educator  [facility]  Instruction Review Code (retired)  2- meets goals/outcomes      Exercise & Equipment Safety: - Individual verbal instruction and demonstration of equipment use and safety with use of the equipment.   Pulmonary Rehab from 02/19/2017 in [facility] Cardiac and Pulmonary Rehab  Date  02/11/17  Educator  [facility]  Instruction Review Code (retired)  2- meets goals/outcomes      Infection Prevention: - Provides verbal and written material to individual with discussion of infection control including proper hand washing and proper equipment cleaning during exercise session.   Pulmonary Rehab from 02/19/2017 in [facility] Cardiac and Pulmonary Rehab  Date  02/11/17  Educator  [facility]  Instruction Review Code (retired)  2- meets [employer] Prevention: - Provides verbal and written material to individual with discussion of falls prevention and safety.   Pulmonary Rehab from 02/19/2017 in [facility] Cardiac and  Pulmonary Rehab  Date  02/11/17  Educator  [facility]  Instruction Review Code (retired)  2- meets goals/outcomes      Diabetes: - Individual verbal and written instruction to review signs/symptoms of diabetes, desired ranges of glucose level fasting, after meals and with exercise. Advice that pre and post exercise glucose checks will be done for 3 sessions at entry of program.   Chronic Lung Diseases: - Group verbal and written instruction to review new updates, new respiratory medications, new advancements in procedures and treatments. Provide informative websites and "800" numbers of self-education.   Lung Procedures: - Group verbal and written instruction to describe testing methods done to diagnose lung disease. Review the outcome of test results. Describe the treatment choices: Pulmonary Function Tests, ABGs and oximetry.   Energy Conservation: - Provide group verbal and written instruction for  methods to conserve energy, plan and organize activities. Instruct on pacing techniques, use of adaptive equipment and posture/positioning to relieve shortness of breath.   Triggers: - Group verbal and written instruction to review types of environmental controls: home humidity, furnaces, filters, dust mite/pet prevention, HEPA vacuums. To discuss weather changes, air quality and the benefits of nasal washing.   Exacerbations: - Group verbal and written instruction to provide: warning signs, infection symptoms, calling MD promptly, preventive modes, and value of vaccinations. Review: effective airway clearance, coughing and/or vibration techniques. Create an Sports administrator.   Oxygen: - Individual and group verbal and written instruction on oxygen therapy. Includes supplement oxygen, available portable oxygen systems, continuous and intermittent flow rates, oxygen safety, concentrators, and Medicare reimbursement for oxygen.   Respiratory Medications: - Group verbal and written instruction to  review medications for lung disease. Drug class, frequency, complications, importance of spacers, rinsing mouth after steroid MDI's, and proper cleaning methods for nebulizers.   AED/CPR: - Group verbal and written instruction with the use of models to demonstrate the basic use of the AED with the basic ABC's of resuscitation.   Breathing Retraining: - Provides individuals verbal and written instruction on purpose, frequency, and proper technique of diaphragmatic breathing and pursed-lipped breathing. Applies individual practice skills.   Anatomy and Physiology of the Lungs: - Group verbal and written instruction with the use of models to provide basic lung anatomy and physiology related to function, structure and complications of lung disease.   Anatomy & Physiology of the Heart: - Group verbal and written instruction and models provide basic cardiac anatomy and physiology, with the coronary electrical and arterial systems. Review of: AMI, Angina, Valve disease, Heart Failure, Cardiac Arrhythmia, Pacemakers, and the ICD.   Heart Failure: - Group verbal and written instruction on the basics of heart failure: signs/symptoms, treatments, explanation of ejection fraction, enlarged heart and cardiomyopathy.   Sleep Apnea: - Individual verbal and written instruction to review Obstructive Sleep Apnea. Review of risk factors, methods for diagnosing and types of masks and machines for OSA.   Anxiety: - Provides group, verbal and written instruction on the correlation between heart/lung disease and anxiety, treatment options, and management of anxiety.   Relaxation: - Provides group, verbal and written instruction about the benefits of relaxation for patients with heart/lung disease. Also provides patients with examples of relaxation techniques.   Cardiac Medications: - Group verbal and written instruction to review commonly prescribed medications for heart disease. Reviews the medication,  class of the drug, and side effects.   Know Your Numbers: -Group verbal and written instruction about important numbers in your health.  Review of Cholesterol, Blood Pressure, Diabetes, and BMI and the role they play in your overall health.   Other: -Provides group and verbal instruction on various topics (see comments)    Knowledge Questionnaire Score:     Knowledge Questionnaire Score - 02/11/17 1448      Knowledge Questionnaire Score   Pre Score 7/10       Core Components/Risk Factors/Patient Goals at Admission:     Personal Goals and Risk Factors at Admission - 02/11/17 1515      Core Components/Risk Factors/Patient Goals on Admission    Weight Management Yes;Weight Loss   Intervention Weight Management: Develop a combined nutrition and exercise program designed to reach desired caloric intake, while maintaining appropriate intake of nutrient and fiber, sodium and fats, and appropriate energy expenditure required for the weight goal.;Weight Management: Provide education and appropriate resources to help  participant work on and attain dietary goals.;Weight Management/Obesity: Establish reasonable short term and long term weight goals.   Admit Weight 202 lb 4.8 oz (91.8 kg)   Goal Weight: Short Term 197 lb (89.4 kg)   Goal Weight: Long Term 192 lb (87.1 kg)   Expected Outcomes Short Term: Continue to assess and modify interventions until short term weight is achieved;Long Term: Adherence to nutrition and physical activity/exercise program aimed toward attainment of established weight goal;Weight Loss: Understanding of general recommendations for a balanced deficit meal plan, which promotes 1-2 lb weight loss per week and includes a negative energy balance of [id number] kcal/d;Understanding recommendations for meals to include 15-35% energy as protein, 25-35% energy from fat, 35-60% energy from carbohydrates, less than '200mg'$  of dietary cholesterol, 20-35 gm of total fiber  daily;Understanding of distribution of calorie intake throughout the day with the consumption of 4-5 meals/snacks   Hypertension Yes   Intervention Provide education on lifestyle modifcations including regular physical activity/exercise, weight management, moderate sodium restriction and increased consumption of fresh fruit, vegetables, and low fat dairy, alcohol moderation, and smoking cessation.;Monitor prescription use compliance.   Expected Outcomes Short Term: Continued assessment and intervention until BP is < 140/61m HG in hypertensive participants. < 130/817mHG in hypertensive participants with diabetes, heart failure or chronic kidney disease.;Long Term: Maintenance of blood pressure at goal levels.   Lipids Yes   Intervention Provide education and support for participant on nutrition & aerobic/resistive exercise along with prescribed medications to achieve LDL '70mg'$ , HDL >'40mg'$ .   Expected Outcomes Short Term: Participant states understanding of desired cholesterol values and is compliant with medications prescribed. Participant is following exercise prescription and nutrition guidelines.;Long Term: Cholesterol controlled with medications as prescribed, with individualized exercise RX and with personalized nutrition plan. Value goals: LDL < '70mg'$ , HDL > 40 mg.      Core Components/Risk Factors/Patient Goals Review:    Core Components/Risk Factors/Patient Goals at Discharge (Final Review):    ITP Comments:     ITP Comments    Row Name 02/11/17 1651 02/21/17 1152 02/24/17 0841       ITP Comments Medical Evaluation Completed. Chart sent for signature and review to Dr. MaEmily Filbertirector of [facility]Visit diagnosis can be [facility] Patient came in today with spo2 in the low 80's. Informed patient that he may need 4 liters continuous instead of pulsed oxygen. Worked with patient on PLB techniques. He recovers when he rest but requires 6 liters when exercising. 30  day review completed. ITP sent to Dr. MaEmily Filbertirector of Lu[facility]spectContinue with ITP unless changes are made by physician.          Comments: 30 day review

## 2017-02-24 NOTE — Progress Notes (Signed)
Daily Session Note  Patient Details  Name: Darin Lopez MRN: [id number] Date of Birth: 09-Jan-1941 Referring Provider:     Pulmonary Rehab from 02/11/2017 in [place] and Pulmonary Rehab  Referring Provider  [facility]      Encounter Date: 02/24/2017  Check In:     Session Check In - 02/24/17 1146      Check-In   Location [facility] & Pulmonary Rehab   Staff Present Earlean Shawl, BS, ACSM CEP, Exercise Physiologist;Amanda Oletta Darter, BA, ACSM CEP, Exercise Physiologist;Chelsi Warr Flavia Shipper   Supervising physician immediately available to respond to emergencies [facility] immediately available ER MD   Physician(s) Dr. Mable Paris and Jimmye Norman   Medication changes reported     No   Fall or balance concerns reported    No   Warm-up and Cool-down Performed as group-led instruction   Resistance Training Performed Yes   VAD Patient? No     Pain Assessment   Currently in Pain? No/denies   Multiple Pain Sites No         History  Smoking Status  . Former Smoker  . Packs/day: 3.00  . Years: 30.00  . Types: Cigarettes  Smokeless Tobacco  . Never Used    Comment: Quit over 35 yrs ago    Goals Met:  Proper associated with RPD/PD & O2 Sat Exercise tolerated well No report of cardiac concerns or symptoms Strength training completed today  Goals Unmet:  Not Applicable  Comments: Pt able to follow exercise prescription today without complaint.  Will continue to monitor for progression.   Dr. Emily Filbert is Medical Director for [place] and [facility].

## 2017-02-26 DIAGNOSIS — J449 Chronic obstructive pulmonary disease, unspecified: Secondary | ICD-10-CM | POA: Diagnosis not present

## 2017-02-26 NOTE — Progress Notes (Signed)
Daily Session Note  Patient Details  Name: JEROMY BORCHERDING MRN: [id number] Date of Birth: 07/19/1940 Referring Provider:     Pulmonary Rehab from 02/11/2017 in [place] and Pulmonary Rehab  Referring Provider  [facility]      Encounter Date: 02/26/2017  Check In:     Session Check In - 02/26/17 1144      Check-In   Location [facility] Pulmonary Rehab   Staff Present Alberteen Sam, MA, ACSM RCEP, Exercise Physiologist;Amanat Hackel Flavia Shipper   Supervising physician immediately available to respond to emergencies [facility] immediately available ER MD   Physician(s) Dr. Burlene Arnt and [facility]   Medication changes reported     No   Fall or balance concerns reported    No   Warm-up and Cool-down Performed as group-led instruction   Resistance Training Performed Yes   VAD Patient? No     Pain Assessment   Currently in Pain? No/denies   Multiple Pain Sites No         History  Smoking Status  . Former Smoker  . Packs/day: 3.00  . Years: 30.00  . Types: Cigarettes  Smokeless Tobacco  . Never Used    Comment: Quit over 35 yrs ago    Goals Met:  Proper associated with RPD/PD & O2 Sat Exercise tolerated well No report of cardiac concerns or symptoms Strength training completed today  Goals Unmet:  Not Applicable  Comments: Pt able to follow exercise prescription today without complaint.  Will continue to monitor for progression.   Dr. Emily Filbert is Medical Director for [place] and [facility].

## 2017-02-28 DIAGNOSIS — J449 Chronic obstructive pulmonary disease, unspecified: Secondary | ICD-10-CM

## 2017-02-28 NOTE — Progress Notes (Signed)
Daily Session Note  Patient Details  Name: Darin Lopez MRN: [id number] Date of Birth: 05/17/1941 Referring Provider:     Pulmonary Rehab from 02/11/2017 in [place] and Pulmonary Rehab  Referring Provider  [facility]      Encounter Date: 02/28/2017  Check In:      History  Smoking Status  . Former Smoker  . Packs/day: 3.00  . Years: 30.00  . Types: Cigarettes  Smokeless Tobacco  . Never Used    Comment: Quit over 35 yrs ago    Goals Met:  Proper associated with RPD/PD & O2 Sat Independence with exercise equipment Exercise tolerated well No report of cardiac concerns or symptoms Strength training completed today  Goals Unmet:  Not Applicable  Comments: Pt able to follow exercise prescription today without complaint.  Will continue to monitor for progression.   Dr. Emily Filbert is Medical Director for [place] and [facility].

## 2017-03-03 DIAGNOSIS — J449 Chronic obstructive pulmonary disease, unspecified: Secondary | ICD-10-CM | POA: Diagnosis not present

## 2017-03-05 ENCOUNTER — Encounter: Payer: Non-veteran care | Attending: Internal Medicine

## 2017-03-05 DIAGNOSIS — Z9981 Dependence on supplemental oxygen: Secondary | ICD-10-CM | POA: Insufficient documentation

## 2017-03-05 DIAGNOSIS — J449 Chronic obstructive pulmonary disease, unspecified: Secondary | ICD-10-CM | POA: Insufficient documentation

## 2017-03-05 DIAGNOSIS — Z7951 Long term (current) use of inhaled steroids: Secondary | ICD-10-CM | POA: Insufficient documentation

## 2017-03-05 DIAGNOSIS — K219 Gastro-esophageal reflux disease without esophagitis: Secondary | ICD-10-CM | POA: Insufficient documentation

## 2017-03-05 DIAGNOSIS — Z79899 Other long term (current) drug therapy: Secondary | ICD-10-CM | POA: Insufficient documentation

## 2017-03-05 DIAGNOSIS — G4730 Sleep apnea, unspecified: Secondary | ICD-10-CM | POA: Insufficient documentation

## 2017-03-05 DIAGNOSIS — F329 Major depressive disorder, single episode, unspecified: Secondary | ICD-10-CM | POA: Insufficient documentation

## 2017-03-05 DIAGNOSIS — Z87891 Personal history of nicotine dependence: Secondary | ICD-10-CM | POA: Insufficient documentation

## 2017-03-06 DIAGNOSIS — C44321 Squamous cell carcinoma of skin of nose: Secondary | ICD-10-CM | POA: Diagnosis not present

## 2017-03-06 DIAGNOSIS — Z6826 Body mass index (BMI) 26.0-26.9, adult: Secondary | ICD-10-CM | POA: Diagnosis not present

## 2017-03-06 DIAGNOSIS — D485 Neoplasm of uncertain behavior of skin: Secondary | ICD-10-CM | POA: Diagnosis not present

## 2017-03-06 DIAGNOSIS — D0471 Carcinoma in situ of skin of right lower limb, including hip: Secondary | ICD-10-CM | POA: Diagnosis not present

## 2017-03-07 ENCOUNTER — Encounter: Payer: Non-veteran care | Admitting: *Deleted

## 2017-03-07 DIAGNOSIS — Z7951 Long term (current) use of inhaled steroids: Secondary | ICD-10-CM | POA: Diagnosis not present

## 2017-03-07 DIAGNOSIS — J449 Chronic obstructive pulmonary disease, unspecified: Secondary | ICD-10-CM

## 2017-03-07 DIAGNOSIS — F329 Major depressive disorder, single episode, unspecified: Secondary | ICD-10-CM | POA: Diagnosis not present

## 2017-03-07 DIAGNOSIS — Z9981 Dependence on supplemental oxygen: Secondary | ICD-10-CM | POA: Diagnosis not present

## 2017-03-07 DIAGNOSIS — Z79899 Other long term (current) drug therapy: Secondary | ICD-10-CM | POA: Diagnosis not present

## 2017-03-07 DIAGNOSIS — Z87891 Personal history of nicotine dependence: Secondary | ICD-10-CM | POA: Diagnosis not present

## 2017-03-07 DIAGNOSIS — G4730 Sleep apnea, unspecified: Secondary | ICD-10-CM | POA: Diagnosis not present

## 2017-03-07 DIAGNOSIS — K219 Gastro-esophageal reflux disease without esophagitis: Secondary | ICD-10-CM | POA: Diagnosis not present

## 2017-03-07 NOTE — Progress Notes (Signed)
Daily Session Note  Patient Details  Name: Darin Lopez MRN: [id number] Date of Birth: 08-15-1940 Referring Provider:     Pulmonary Rehab from 02/11/2017 in [facility] Rehab  Referring Provider  [facility]      Encounter Date: 03/07/2017  Check In:     Session Check In - 03/07/17 1128      Check-In   Location [facility] Pulmonary Rehab   Staff Present Alberteen Sam, MA, ACSM RCEP, Exercise Physiologist;Krista Frederico Hamman, RN BSN;[name], RN BSN   Supervising physician immediately available to respond to emergencies [facility] immediately available ER MD   Physician(s) Drs. Karma Greaser and Kinner   Medication changes reported     No   Fall or balance concerns reported    No   Warm-up and Cool-down Performed on first and last piece of equipment   Resistance Training Performed Yes   VAD Patient? No     Pain Assessment   Currently in Pain? No/denies   Multiple Pain Sites No         History  Smoking Status  . Former Smoker  . Packs/day: 3.00  . Years: 30.00  . Types: Cigarettes  Smokeless Tobacco  . Never Used    Comment: Quit over 35 yrs ago    Goals Met:  Proper associated with RPD/PD & O2 Sat Independence with exercise equipment Using PLB without cueing & demonstrates good technique Exercise tolerated well Personal goals reviewed No report of cardiac concerns or symptoms Strength training completed today  Goals Unmet:  Not Applicable  Comments: Pt able to follow exercise prescription today without complaint.  Will continue to monitor for progression.  Reviewed home exercise with pt today.  Pt plans to walk at home for exercise once he gets his new concentrator.  Reviewed THR, pulse, RPE, sign and symptoms and when to call 911 or MD.  Also discussed weather considerations and indoor options.  Pt voiced understanding.   Dr. Emily Filbert is Medical Director for [place] and [facility].

## 2017-03-10 DIAGNOSIS — J449 Chronic obstructive pulmonary disease, unspecified: Secondary | ICD-10-CM | POA: Diagnosis not present

## 2017-03-10 NOTE — Progress Notes (Signed)
Daily Session Note  Patient Details  Name: CLEMENCE STILLINGS MRN: [id number] Date of Birth: [birth year] Referring Provider:     Pulmonary Rehab from 02/11/2017 in [place] and Pulmonary Rehab  Referring Provider  [facility]      Encounter Date: 03/10/2017  Check In:     Session Check In - 03/10/17 1151      Check-In   Location [facility] Pulmonary Re[facility]   Staff Present Earlean Shawl, BS, ACSM CEP, Exercise Physiologist;Amanda Oletta Darter, BA, ACSM CEP, Exercise Physiologist;Anissa Abbs Flavia Shipper   Supervising physician immediately available to respond to emergencies [facility] immediately available ER MD   Physician(s) Dr. Corky Downs and Jimmye Norman   Medication changes reported     No   Fall or balance concerns reported    No   Warm-up and Cool-down Performed as group-led instruction   Resistance Training Performed Yes   VAD Patient? No     Pain Assessment   Currently in Pain? No/denies   Multiple Pain Sites No         History  Smoking Status  . Former Smoker  . Packs/day: 3.00  . Years: 30.00  . Types: Cigarettes  Smokeless Tobacco  . Never Used    Comment: Quit over 35 yrs ago    Goals Met:  Independence with exercise equipment Exercise tolerated well No report of cardiac concerns or symptoms Strength training completed today  Goals Unmet:  Not Applicable  Comments: Pt able to follow exercise prescription today without complaint.  Will continue to monitor for progression.   Dr. Emily Filbert is Medical Director for [place] and [facility].

## 2017-03-12 DIAGNOSIS — J449 Chronic obstructive pulmonary disease, unspecified: Secondary | ICD-10-CM | POA: Diagnosis not present

## 2017-03-12 NOTE — Progress Notes (Signed)
Daily Session Note  Patient Details  Name: Darin Lopez MRN: [id number] Date of Birth: 02/08/1941 Referring Provider:     Pulmonary Rehab from 02/11/2017 in [place] and Pulmonary Rehab  Referring Provider  [facility]      Encounter Date: 03/12/2017  Check In:     Session Check In - 03/12/17 1129      Check-In   Location [facility]   Staff Present Alberteen Sam, MA, ACSM RCEP, Exercise Physiologist;[name], RN BSN;Jasiya Markie Flavia Shipper   Supervising physician immediately available to respond to emergencies [facility] immediately available ER MD   Physician(s) Dr. Alfred Levins and Jimmye Norman   Medication changes reported     No   Fall or balance concerns reported    No   Warm-up and Cool-down Performed as group-led instruction   Resistance Training Performed Yes   VAD Patient? No     Pain Assessment   Currently in Pain? No/denies   Multiple Pain Sites No           Exercise Prescription Changes - 03/11/17 1400      Response to Exercise   Blood Pressure (Admit) 122/70   Blood Pressure (Exit) 118/62   Heart Rate (Admit) 85 bpm   Heart Rate (Exercise) 103 bpm   Heart Rate (Exit) 88 bpm   Oxygen Saturation (Admit) 93 %   Oxygen Saturation (Exercise) 84 %   Oxygen Saturation (Exit) 96 %   Rating of Perceived Exertion (Exercise) 15   Perceived Dyspnea (Exercise) 4   Symptoms SOB   Comments desaturates on treadmill   Duration Continue with 45 min of aerobic exercise without signs/symptoms of physical distress.   Intensity THRR unchanged     Progression   Progression Continue to progress workloads to maintain intensity without signs/symptoms of physical distress.   Average METs 2.64     Resistance Training   Training Prescription Yes   Weight 4 lbs   Reps 10-15     Interval Training   Interval Training No     Oxygen   Oxygen Continuous   Liters 6     Treadmill   MPH 1.5   Grade 0   Minutes 15   METs 2.15     Arm  Ergometer   Level 1   RPM 40   Minutes 15   METs 2.5     REL-XR   Level 1   Speed 43   Minutes 15   METs 3.5     Home Exercise Plan   Plans to continue exercise at Home (comment)  walking   Frequency Add 1 additional day to program exercise sessions.  once he gets new concentrator   Initial Home Exercises Provided 03/07/17      History  Smoking Status  . Former Smoker  . Packs/day: 3.00  . Years: 30.00  . Types: Cigarettes  Smokeless Tobacco  . Never Used    Comment: Quit over 35 yrs ago    Goals Met:  Independence with exercise equipment Exercise tolerated well No report of cardiac concerns or symptoms Strength training completed today  Goals Unmet:  Not Applicable  Comments: Pt able to follow exercise prescription today without complaint.  Will continue to monitor for progression.   Dr. Emily Filbert is Medical Director for [place] and [facility].

## 2017-03-14 ENCOUNTER — Encounter: Payer: Non-veteran care | Admitting: *Deleted

## 2017-03-14 DIAGNOSIS — J449 Chronic obstructive pulmonary disease, unspecified: Secondary | ICD-10-CM | POA: Diagnosis not present

## 2017-03-14 NOTE — Progress Notes (Signed)
Daily Session Note  Patient Details  Name: Darin Lopez MRN: [id number] Date of Birth: 1941-03-30 Referring Provider:     Pulmonary Rehab from 02/11/2017 in [place] and Pulmonary Rehab  Referring Provider  [facility]      Encounter Date: 03/14/2017  Check In:     Session Check In - 03/14/17 1014      Check-In   Location [facility] Pulmonary Rehab   Staff Present Renita Papa, RN Vickki Hearing, BA, ACSM CEP, Exercise Physiologist;Jessica Luan [place], [place], ACSM RCEP, Exercise Physiologist   Supervising physician immediately available to respond to emergencies [facility] immediately available ER MD   Physician(s) Dr. Clearnce Hasten and Jimmye Norman    Medication changes reported     No   Fall or balance concerns reported    No   Warm-up and Cool-down Performed as group-led instruction   Resistance Training Performed Yes   VAD Patient? No     Pain Assessment   Currently in Pain? No/denies         History  Smoking Status  . Former Smoker  . Packs/day: 3.00  . Years: 30.00  . Types: Cigarettes  Smokeless Tobacco  . Never Used    Comment: Quit over 35 yrs ago    Goals Met:  Proper associated with RPD/PD & O2 Sat Independence with exercise equipment Using PLB without cueing & demonstrates good technique Exercise tolerated well Strength training completed today  Goals Unmet:  Not Applicable  Comments: Pt able to follow exercise prescription today without complaint.  Will continue to monitor for progression.    Dr. Emily Filbert is Medical Director for [place] and [facility].

## 2017-03-17 DIAGNOSIS — J449 Chronic obstructive pulmonary disease, unspecified: Secondary | ICD-10-CM

## 2017-03-17 NOTE — Progress Notes (Signed)
Daily Session Note  Patient Details  Name: Darin Lopez MRN: [id number] Date of Birth: 01-14-1941 Referring Provider:     Pulmonary Rehab from 02/11/2017 in [place] and Pulmonary Rehab  Referring Provider  [facility]      Encounter Date: 03/17/2017  Check In:     Session Check In - 03/17/17 1134      Check-In   Location [facility] Pulmonary Re[facility]   Staff Present Nada Maclachlan, BA, ACSM CEP, Exercise Physiologist;Kelly Amedeo Plenty, BS, ACSM CEP, Exercise Physiologist;Thedore Pickel Flavia Shipper   Supervising physician immediately available to respond to emergencies [facility] immediately available ER MD   Physician(s) Dr. Corky Downs and Jimmye Norman   Medication changes reported     No   Fall or balance concerns reported    No   Warm-up and Cool-down Performed as group-led instruction   Resistance Training Performed Yes   VAD Patient? No     Pain Assessment   Currently in Pain? No/denies   Multiple Pain Sites No         History  Smoking Status  . Former Smoker  . Packs/day: 3.00  . Years: 30.00  . Types: Cigarettes  Smokeless Tobacco  . Never Used    Comment: Quit over 35 yrs ago    Goals Met:  Independence with exercise equipment Exercise tolerated well No report of cardiac concerns or symptoms Strength training completed today  Goals Unmet:  Not Applicable  Comments: Pt able to follow exercise prescription today without complaint.  Will continue to monitor for progression.   Dr. Emily Filbert is Medical Director for [place] and [facility].

## 2017-03-19 ENCOUNTER — Encounter: Payer: Non-veteran care | Admitting: *Deleted

## 2017-03-19 DIAGNOSIS — J449 Chronic obstructive pulmonary disease, unspecified: Secondary | ICD-10-CM | POA: Diagnosis not present

## 2017-03-19 NOTE — Progress Notes (Signed)
Daily Session Note  Patient Details  Name: Darin Lopez MRN: [id number] Date of Birth: 04/08/1941 Referring Provider:     Pulmonary Rehab from 02/11/2017 in [facility] and Pulmonary Rehab  Referring Provider  [facility]      Encounter Date: 03/19/2017  Check In:     Session Check In - 03/19/17 1125      Check-In   Location [facility] Pulmonary Rehab   Staff Present  , RN BSN;Joseph Hood RCP,RRT,BSRT;Jessica Hawkins, MA, ACSM RCEP, Exercise Physiologist   Supervising physician immediately available to respond to emergencies [facility] immediately available ER MD   Physician(s) Dr. Stafford and [place]   Medication changes reported     No   Fall or balance concerns reported    No   Warm-up and Cool-down Performed as group-led instruction   Resistance Training Performed Yes   VAD Patient? No     Pain Assessment   Currently in Pain? No/denies         History  Smoking Status  . Former Smoker  . Packs/day: 3.00  . Years: 30.00  . Types: Cigarettes  Smokeless Tobacco  . Never Used    Comment: Quit over 35 yrs ago    Goals Met:  Proper associated with RPD/PD & O2 Sat Independence with exercise equipment Using PLB without cueing & demonstrates good technique Exercise tolerated well Strength training completed today  Goals Unmet:  Not Applicable  Comments: Pt able to follow exercise prescription today without complaint.  Will continue to monitor for progression.    Dr. Mark Miller is Medical Director for [facility] and [facility]. 

## 2017-03-20 DIAGNOSIS — K51019 Ulcerative (chronic) pancolitis with unspecified complications: Secondary | ICD-10-CM | POA: Diagnosis not present

## 2017-03-21 ENCOUNTER — Encounter: Payer: Non-veteran care | Admitting: *Deleted

## 2017-03-21 DIAGNOSIS — J449 Chronic obstructive pulmonary disease, unspecified: Secondary | ICD-10-CM | POA: Diagnosis not present

## 2017-03-21 NOTE — Progress Notes (Signed)
Daily Session Note  Patient Details  Name: Darin Lopez MRN: [id number] Date of Birth: 1940/07/17 Referring Provider:     Pulmonary Rehab from 02/11/2017 in [place] and Pulmonary Rehab  Referring Provider  [facility]      Encounter Date: 03/21/2017  Check In:     Session Check In - 03/21/17 1123      Check-In   Location [facility] Pulmonary Rehab   Staff Present Renita Papa, RN BSN;Joseph Darrin Nipper, [place], ACSM RCEP, Exercise Physiologist   Supervising physician immediately available to respond to emergencies [facility] immediately available ER MD   Physician(s) Dr. Archie Balboa and Joni Fears    Medication changes reported     No   Fall or balance concerns reported    No   Warm-up and Cool-down Performed as group-led instruction   Resistance Training Performed Yes   VAD Patient? No     Pain Assessment   Currently in Pain? No/denies         History  Smoking Status  . Former Smoker  . Packs/day: 3.00  . Years: 30.00  . Types: Cigarettes  Smokeless Tobacco  . Never Used    Comment: Quit over 35 yrs ago    Goals Met:  Proper associated with RPD/PD & O2 Sat Independence with exercise equipment Using PLB without cueing & demonstrates good technique Exercise tolerated well Strength training completed today  Goals Unmet:  Not Applicable  Comments: Pt able to follow exercise prescription today without complaint.  Will continue to monitor for progression.    Dr. Emily Filbert is Medical Director for [place] and [facility].

## 2017-03-24 DIAGNOSIS — J449 Chronic obstructive pulmonary disease, unspecified: Secondary | ICD-10-CM | POA: Diagnosis not present

## 2017-03-24 NOTE — Progress Notes (Signed)
Pulmonary Individual Treatment Plan  Patient Details  Name: Darin Lopez MRN: [id number] Date of Birth: January 23, 1941 Referring Provider:     Pulmonary Rehab from 02/11/2017 in [facility]  Referring Provider  [facility]      Initial Encounter Date:    Pulmonary [facility]11/[facility]  Date  02/11/17  Referring Provider  [facility]      Visit Diagnosis: Chronic obstructive pulmonary disease, unspecified COPD type ([place])  Patient's Home Medications on Admission:  Current Outpatient Prescriptions:  .  acidophilus (RISAQUAD) CAPS capsule, Take 1 capsule by mouth daily., Disp: , Rfl:  .  albuterol (PROVENTIL HFA;VENTOLIN HFA) 108 (90 Base) MCG/ACT inhaler, Inhale 2 puffs into the lungs every 6 (six) hours as needed for wheezing or shortness of breath., Disp: 1 Inhaler, Rfl: 2 .  benzonatate (TESSALON PERLES) 100 MG capsule, Take 1 capsule (100 mg total) by mouth 3 (three) times daily as needed for cough., Disp: 20 capsule, Rfl: 0 .  Budesonide (UCERIS) 9 MG TB24, Take 9 mg by mouth daily., Disp: , Rfl:  .  budesonide-formoterol (SYMBICORT) 160-4.5 MCG/ACT inhaler, Inhale 2 puffs into the lungs 2 (two) times daily., Disp: , Rfl:  .  cephALEXin (KEFLEX) 500 MG capsule, Take 1 capsule (500 mg total) by mouth every 12 (twelve) hours., Disp: 10 capsule, Rfl: 0 .  chlorpheniramine-HYDROcodone (TUSSIONEX) 10-8 MG/5ML SUER, Take 5 mLs by mouth every 12 (twelve) hours as needed for cough., Disp: 115 mL, Rfl: 0 .  Coenzyme Q10 (CO Q 10) 60 MG CAPS, Take 60 mg by mouth daily., Disp: , Rfl:  .  diphenhydrAMINE (BENADRYL) 25 MG tablet, Take 25 mg by mouth at bedtime as needed for allergies or sleep. , Disp: , Rfl:  .  gabapentin (NEURONTIN) 300 MG capsule, Take 300 mg by mouth at bedtime. , Disp: , Rfl:  .  mercaptopurine (PURINETHOL) 50 MG tablet, Take 100 mg by mouth daily., Disp: , Rfl:  .  mesalamine (PENTASA) 250 MG CR capsule, Take 1,000 mg  by mouth daily. , Disp: , Rfl:  .  Multiple Vitamin (MULTIVITAMIN WITH MINERALS) TABS tablet, Take 1 tablet by mouth daily., Disp: , Rfl:  .  nitroGLYCERIN (NITROSTAT) 0.4 MG SL tablet, Place 0.4 mg under the tongue every 5 (five) minutes as needed for chest pain., Disp: , Rfl:  .  omeprazole (PRILOSEC) 20 MG capsule, Take 20 mg by mouth daily., Disp: , Rfl:  .  pravastatin (PRAVACHOL) 20 MG tablet, Take 20 mg by mouth at bedtime. , Disp: , Rfl:  .  predniSONE (DELTASONE) 10 MG tablet, Take 10 mg by mouth daily., Disp: , Rfl:  .  predniSONE (DELTASONE) 5 MG tablet, 40 mg po daily for 2 days, 20 mg po daily for 2 days, then continue home dose 10 mg po daily., Disp: 30 tablet, Rfl: 0 .  salmeterol (SEREVENT) 50 MCG/DOSE diskus inhaler, Inhale 1 puff into the lungs daily., Disp: , Rfl:  .  sertraline (ZOLOFT) 50 MG tablet, Take 100 mg by mouth daily. , Disp: , Rfl:  .  terazosin (HYTRIN) 10 MG capsule, Take 20 mg by mouth at bedtime. , Disp: , Rfl:  .  tiotropium (SPIRIVA) 18 MCG inhalation capsule, Place 1 capsule (18 mcg total) into inhaler and inhale daily., Disp: 30 capsule, Rfl: 2 .  zolpidem (AMBIEN) 10 MG tablet, Take 5-10 mg by mouth at bedtime as needed for sleep. , Disp: , Rfl:  Past Medical History: Past Medical History:  Diagnosis Date  . Colitis   . COPD (chronic obstructive pulmonary disease) ([place])   . Depression   . Emphysema   . Emphysema of lung ([place])   . GERD (gastroesophageal reflux disease)   . Heart murmur    "all my life"  . Hypertension    Hx - denies current issues  . On home oxygen therapy    4L - continuous  . Shortness of breath dyspnea   . Sleep apnea    CPAP  . Wears dentures    full upper and lower    Tobacco Use: History  Smoking Status  . Former Smoker  . Packs/day: 3.00  . Years: 30.00  . Types: Cigarettes  Smokeless Tobacco  . Never Used    Comment: Quit over 35 yrs ago    Labs: Recent Review Flowsheet Data    There is no flowsheet data  to display.       Pulmonary Assessment Scores:     Pulmonary Assessment Scores    Row Name 02/11/17 1448         ADL UCSD   ADL Phase Entry     SOB Score total 76     Rest 1     Walk 2     Stairs 4     Bath 2     Dress 2     Shop 4       CAT Score   CAT Score 30       mMRC Score   mMRC Score 3        Pulmonary Function Assessment:     Pulmonary Function Assessment - 02/11/17 1507      Initial Spirometry Results   FVC% 59 %   FEV1% 51 %   FEV1/FVC Ratio 63.43     Post Bronchodilator Spirometry Results   FVC% 64.31 %   FEV1% 58.14 %   FEV1/FVC Ratio 66.62     Breath   Bilateral Breath Sounds Clear;Decreased   Shortness of Breath Limiting activity;Yes      Exercise Target Goals:    Exercise Program Goal: Individual exercise prescription set with THRR, safety & activity barriers. Participant demonstrates ability to understand and report RPE using BORG scale, to self-measure pulse accurately, and to acknowledge the importance of the exercise prescription.  Exercise Prescription Goal: Starting with aerobic activity 30 plus minutes a day, 3 days per week for initial exercise prescription. Provide home exercise prescription and guidelines that participant acknowledges understanding prior to discharge.  Activity Barriers & Risk Stratification:     Activity Barriers & Cardiac Risk Stratification - 02/11/17 1617      Activity Barriers & Cardiac Risk Stratification   Activity Barriers Deconditioning;Muscular Weakness;Shortness of Breath      6 Minute Walk:     6 Minute Walk    Row Name 02/11/17 1612         6 Minute Walk   Phase Initial     Distance 722 feet     Walk Time 3.58 minutes     # of Rest Breaks 0  test stopped at 3:35 due to desaturation     MPH 2.29     METS 1.98     RPE 15     Perceived Dyspnea  3.5     VO2 Peak 6.93     Symptoms Yes (comment)     Comments SOB     Resting HR 95 bpm  Resting BP 126/66     Max Ex. HR 120  bpm     Max Ex. BP 132/74     2 Minute Post BP 126/56       Interval HR   Baseline HR (retired) 95     1 Minute HR 120     2 Minute HR 121     3 Minute HR 125     4 Minute HR 115  test stopped at 3:35 HR was 110     5 Minute HR 94     6 Minute HR 58     Interval Heart Rate? Yes       Interval Oxygen   Interval Oxygen? Yes     Baseline Oxygen Saturation % 92 %     Resting Liters of Oxygen 4 L  pulsed     1 Minute Oxygen Saturation % 87 %     1 Minute Liters of Oxygen 4 L     2 Minute Oxygen Saturation % 83 %     2 Minute Liters of Oxygen 4 L     3 Minute Oxygen Saturation % 79 %     3 Minute Liters of Oxygen 4 L     4 Minute Oxygen Saturation % 80 %  test stopped at 3:35 SaO2 was 78%     4 Minute Liters of Oxygen 4 L     5 Minute Oxygen Saturation % 90 %     5 Minute Liters of Oxygen 4 L     6 Minute Oxygen Saturation % 95 %     6 Minute Liters of Oxygen 4 L       Oxygen Initial Assessment:     Oxygen Initial Assessment - 02/11/17 1458      Home Oxygen   Home Oxygen Device Liquid Oxygen;Home Concentrator   Sleep Oxygen Prescription CPAP   Liters per minute 5   Home Exercise Oxygen Prescription Continuous   Liters per minute 5   Home at Rest Exercise Oxygen Prescription Continuous   Liters per minute 5   Compliance with Home Oxygen Use Yes     Initial 6 min Walk   Oxygen Used Pulsed;Liquid Oxygen   Liters per minute 4   Resting Oxygen Saturation  92 %   Exercise Oxygen Saturation  during 6 min walk 78 %     Program Oxygen Prescription   Program Oxygen Prescription Continuous;E-Tanks   Liters per minute 6   Comments Still desaturated during walk on 6L.  We will have the patient start with intermitten exercise, especially on the treadmill.      Intervention   Short Term Goals To learn and exhibit compliance with exercise, home and travel O2 prescription;To Learn and understand importance of maintaining oxygen saturations>88%;To learn and understand  importance of monitoring SPO2 with pulse oximeter and demonstrate accurate use of the pulse oximeter.;To learn and demonstrate proper purse lipped breathing techniques or other breathing techniques.;To learn and demonstrate proper use of respiratory medications   Long  Term Goals Exhibits compliance with exercise, home and travel O2 prescription;Maintenance of O2 saturations>88%;Compliance with respiratory medication;Demonstrates proper use of MDI's;Exhibits proper breathing techniques, such as purse lipped breathing or other method taught during program session;Verbalizes importance of monitoring SPO2 with pulse oximeter and return demonstration      Oxygen Re-Evaluation:     Oxygen Re-Evaluation    Row Name 03/07/17 1418 03/14/17 1025           Program  Oxygen Prescription   Program Oxygen Prescription Continuous;E-Tanks Continuous;E-Tanks      Liters per minute 6 6  10L walking      Comments  - Once pendant ordered, we will change to 6L for time        [place];Liquid Oxygen Home Concentrator;Liquid Oxygen      Sleep Oxygen Prescription CPAP CPAP      Liters per minute 4 1      Home Exercise Oxygen Prescription Pulsed Continuous      Liters per minute 4 6  pendant      Home at Rest Exercise Oxygen Prescription Continuous Continuous      Liters per minute 4 2  pendant      Compliance with Home Oxygen Use Yes Yes        Goals/Expected Outcomes   Short Term Goals To learn and exhibit compliance with exercise, home and travel O2 prescription;To learn and demonstrate proper use of respiratory medications;To learn and understand importance of maintaining oxygen saturations>88%;To learn and demonstrate proper pursed lip breathing techniques or other breathing techniques.;To learn and understand importance of monitoring SPO2 with pulse oximeter and demonstrate accurate use of the pulse oximeter. To learn and exhibit compliance with exercise,  home and travel O2 prescription      Long  Term Goals Exhibits compliance with exercise, home and travel O2 prescription;Maintenance of O2 saturations>88%;Compliance with respiratory medication;Verbalizes importance of monitoring SPO2 with pulse oximeter and return demonstration;Exhibits proper breathing techniques, such as pursed lip breathing or other method taught during program session;Demonstrates proper use of MDI's Exhibits compliance with exercise, home and travel O2 prescription      Comments Darin Lopez has been compliant with his oxygen.  He is working with his doctor to get  a new portable concentrator to be able to use continous flow for exercise.  He does use his pulse oximeter at home to monitor his saturations.  Darin Lopez continues to gain proficiency at using PLB.  He continues to use his inhaler and has recently changed back to a different medication.  Previously, this med had given him a migraine, but so far so good.  He has not been using his spacer, but will look for it over the weekend.   Darin Lopez had his [place] appointment yesterday with pulmonary.  They increased his prescription see notes above.  He was ordered to start using a pendant or to increase liter flow to 10L on standard cannula.      Goals/Expected Outcomes Short: Find spacer to use with inhaler.  Long: Get new Marine scientist.  Short: Get outfitted for new concentrator and pendant, ask for pendant for use in rehab.  Long: Continued complaince and improved saturations with exertion.          Oxygen Discharge (Final Oxygen Re-Evaluation):     Oxygen Re-Evaluation - 03/14/17 1025      Program Oxygen Prescription   Program Oxygen Prescription Continuous;E-Tanks   Liters per minute 6  10L walking   Comments Once pendant ordered, we will change to 6L for time     [place];Liquid Oxygen   Sleep Oxygen Prescription CPAP   Liters per minute 1   Home Exercise Oxygen Prescription Continuous    Liters per minute 6  pendant   Home at Rest Exercise Oxygen Prescription Continuous   Liters per minute 2  pendant   Compliance with Home Oxygen Use Yes  Goals/Expected Outcomes   Short Term Goals To learn and exhibit compliance with exercise, home and travel O2 prescription   Long  Term Goals Exhibits compliance with exercise, home and travel O2 prescription   Comments Darin Lopez [place] appointment yesterday with pulmonary.  They increased his prescription see notes above.  He was ordered to start using a pendant or to increase liter flow to 10L on standard cannula.   Goals/Expected Outcomes Short: Get outfitted for new concentrator and pendant, ask for pendant for use in rehab.  Long: Continued complaince and improved saturations with exertion.       Initial Exercise Prescription:     Initial Exercise Prescription - 02/11/17 1600      Date of Initial Exercise RX and Referring Provider   Date 02/11/17   Referring Provider [facility]     Oxygen   Oxygen Continuous   Liters 6     Treadmill   MPH 2   Grade 0   Minutes 8  2 min intervals x4   METs 2.53     Arm Ergometer   Level 2   Watts 29   RPM 25   Minutes 15   METs 2     REL-XR   Level 1   Speed 50   Minutes 15   METs 2     Prescription Details   Frequency (times per week) 3   Duration Progress to 45 minutes of aerobic exercise without signs/symptoms of physical distress     Intensity   THRR 40-80% of Max Heartrate 115-135   Ratings of Perceived Exertion 11-13   Perceived Dyspnea 0-4     Progression   Progression Continue to progress workloads to maintain intensity without signs/symptoms of physical distress.     Resistance Training   Training Prescription Yes   Weight 4 lbs   Reps 10-15      Perform Capillary Blood Glucose checks as needed.  Exercise Prescription Changes:     Exercise Prescription Changes    Row Name 02/11/17 1600 02/26/17 1500 03/07/17 1400 03/11/17 1400       Response  to Exercise   Blood Pressure (Admit) 126/66 122/64  - 122/70    Blood Pressure (Exercise) 132/74  -  -  -    Blood Pressure (Exit) 126/56 124/62  - 118/62    Heart Rate (Admit) 95 bpm 69 bpm  - 85 bpm    Heart Rate (Exercise) 125 bpm 111 bpm  - 103 bpm    Heart Rate (Exit) 58 bpm 93 bpm  - 88 bpm    Oxygen Saturation (Admit) 92 % 96 %  - 93 %    Oxygen Saturation (Exercise) 78 % 88 %  - 84 %    Oxygen Saturation (Exit) 95 % 96 %  - 96 %    Rating of Perceived Exertion (Exercise) 15 15  - 15    Perceived Dyspnea (Exercise) 3.5 4  - 4    Symptoms SOB SOB  - SOB    Comments walk test results  -  - desaturates on treadmill    Duration  - Progress to 45 minutes of aerobic exercise without signs/symptoms of physical distress  - Continue with 45 min of aerobic exercise without signs/symptoms of physical distress.    Intensity  - THRR unchanged  - THRR unchanged      Progression   Progression  - Continue to progress workloads to maintain intensity without signs/symptoms of physical distress.  -  Continue to progress workloads to maintain intensity without signs/symptoms of physical distress.    Average METs  - 2.64  - 2.64      Resistance Training   Training Prescription  - Yes  - Yes    Weight  - 4 lbs  - 4 lbs    Reps  - 10-15  - 10-15      Interval Training   Interval Training  - No  - No      Oxygen   Oxygen  - Continuous  - Continuous    Liters  - 6  - 6      Treadmill   MPH  - 2  - 1.5    Grade  - 0  - 0    Minutes  - 15  still intermitten  - 15    METs  - 2.53  - 2.15      Arm Ergometer   Level  - 1  - 1    RPM  - 41  - 40    Minutes  - 15  - 15    METs  - 2.1  - 2.5      REL-XR   Level  - 1  - 1    Speed  - 48  - 43    Minutes  - 15  - 15    METs  - 3.3  - 3.5      Home Exercise Plan   Plans to continue exercise at  -  - Home (comment)  walking Home (comment)  walking    Frequency  -  - Add 1 additional day to program exercise sessions.  once he gets new  concentrator Add 1 additional day to program exercise sessions.  once he gets new concentrator    Initial Home Exercises Provided  -  - 03/07/17 03/07/17       Exercise Comments:     Exercise Comments    Row Name 02/11/17 1617 02/19/17 1127         Exercise Comments Also completed walk test on 6L oxygen.  Test stopped at 3:15 when saturations dropped to 83%.  1 min 93%, 2 min 87%, 3 min 83%, 4 min 94% (seated).  We will start his exercise as no continuous.  He will start with 2 min intervals especially on the treadmill.  We will see how he is able to handle seated exercise to determine if intermitten exercise is needed on there as well.  First full day of exercise!  Patient was oriented to gym and equipment including functions, settings, policies, and procedures.  Patient's individual exercise prescription and treatment plan were reviewed.  All starting workloads were established based on the results of the 6 minute walk test done at initial orientation visit.  The plan for exercise progression was also introduced and progression will be customized based on patient's performance and goals.         Exercise Goals and Review:     Exercise Goals    Row Name 02/11/17 1623             Exercise Goals   Increase Physical Activity Yes       Intervention Provide advice, education, support and counseling about physical activity/exercise needs.;Develop an individualized exercise prescription for aerobic and resistive training based on initial evaluation findings, risk stratification, comorbidities and participant's personal goals.       Expected Outcomes Achievement of increased cardiorespiratory fitness and enhanced flexibility, muscular endurance  and strength shown through measurements of functional capacity and personal statement of participant.       Increase Strength and Stamina Yes       Intervention Provide advice, education, support and counseling about physical activity/exercise  needs.;Develop an individualized exercise prescription for aerobic and resistive training based on initial evaluation findings, risk stratification, comorbidities and participant's personal goals.       Expected Outcomes Achievement of increased cardiorespiratory fitness and enhanced flexibility, muscular endurance and strength shown through measurements of functional capacity and personal statement of participant.          Exercise Goals Re-Evaluation :     Exercise Goals Re-Evaluation    Row Name 02/26/17 1525 03/07/17 1246 03/11/17 1456         Exercise Goal Re-Evaluation   Exercise Goals Review Increase Physical Activity;Increase Strength and Stamina Increase Physical Activity;Increase Strength and Stamina;Understanding of Exercise Prescription;Knowledge and understanding of Target Heart Rate Range (THRR);Able to understand and use rate of perceived exertion (RPE) scale;Able to understand and use Dyspnea scale;Able to check pulse independently Increase Physical Activity;Increase Strength and Stamina     Comments Darin Lopez is off to a good start in rehab.  He is limited in activity by his oxygen saturations.  When he is here on 6L, he does well, but just coming in on only 3-4L pulsed wears him out before he even gets started with exercsie.  He is up to 15 min continuous on both seated pieces of equipment.  We will continue to moniotor his progresion.  Reviewed home exercise with pt today.  Pt plans to walk at home for exercise once he gets his new concentrator.  Reviewed THR, pulse, RPE, sign and symptoms and when to call 911 or MD.  Also discussed weather considerations and indoor options.  Pt voiced understanding. Darin Lopez continues to do well in rehab.  He continues to desaturate when walking even with 6L of oxygen.  We continue to await a new concentrator for home use.  He is up to 3.5 METs on the XR.  We will continue to monitor his progression.     Expected Outcomes Short: Darin Lopez will talk to his doctor  about increasing his O2 prescription and then we can discuss home exercise.  Long: Continue to build strength and stamina. Short: Darin Lopez will start to add in exercise once he gets a new portable concentrator that supports continuous flow.  Long: Continue to build strength and stamina.  Short: Increase workloads on seated equipment.  Long: Continue to build stamina on treadmill.        Discharge Exercise Prescription (Final Exercise Prescription Changes):     Exercise Prescription Changes - 03/11/17 1400      Response to Exercise   Blood Pressure (Admit) 122/70   Blood Pressure (Exit) 118/62   Heart Rate (Admit) 85 bpm   Heart Rate (Exercise) 103 bpm   Heart Rate (Exit) 88 bpm   Oxygen Saturation (Admit) 93 %   Oxygen Saturation (Exercise) 84 %   Oxygen Saturation (Exit) 96 %   Rating of Perceived Exertion (Exercise) 15   Perceived Dyspnea (Exercise) 4   Symptoms SOB   Comments desaturates on treadmill   Duration Continue with 45 min of aerobic exercise without signs/symptoms of physical distress.   Intensity THRR unchanged     Progression   Progression Continue to progress workloads to maintain intensity without signs/symptoms of physical distress.   Average METs 2.64     Resistance Training  Training Prescription Yes   Weight 4 lbs   Reps 10-15     Interval Training   Interval Training No     Oxygen   Oxygen Continuous   Liters 6     Treadmill   MPH 1.5   Grade 0   Minutes 15   METs 2.15     Arm Ergometer   Level 1   RPM 40   Minutes 15   METs 2.5     REL-XR   Level 1   Speed 43   Minutes 15   METs 3.5     Home Exercise Plan   Plans to continue exercise at Home (comment)  walking   Frequency Add 1 additional day to program exercise sessions.  once he gets new concentrator   Initial Home Exercises Provided 03/07/17      Nutrition:  Target Goals: Understanding of nutrition guidelines, daily intake of sodium '1500mg'$ , cholesterol '200mg'$ , calories 30%  from fat and 7% or less from saturated fats, daily to have 5 or more servings of fruits and vegetables.  Biometrics:     Pre Biometrics - 02/11/17 1623      Pre Biometrics   Height 6' 0.4" (1.839 m)   Weight 202 lb 4.8 oz (91.8 kg)   Waist Circumference 42.5 inches   Hip Circumference 39 inches   Waist to Hip Ratio 1.09 %   BMI (Calculated) 27.2       Nutrition Therapy Plan and Nutrition Goals:     Nutrition Therapy & Goals - 03/07/17 1409      Nutrition Therapy   RD appointment defered Yes      Nutrition Discharge: Rate Your Plate Scores:   Nutrition Goals Re-Evaluation:     Nutrition Goals Re-Evaluation    [place] Name 03/07/17 1409             Goals   Nutrition Goal Healthy Diet       Comment Darin Lopez continues to defer a dietician appointment.  He has been eating well, but admits to cheating on occasion wiht country ham and fried shrimp.  He would like to eat more red meat but know that it is not good for him.  Manus Gunning help him keep an eye on his diet and making sure that he eats good.       Expected Outcome Short: Try not to cheat as much on diet.  Long: Continue healty eating          Nutrition Goals Discharge (Final Nutrition Goals Re-Evaluation):     Nutrition Goals Re-Evaluation - 03/07/17 1409      Goals   Nutrition Goal Healthy Diet   Comment Darin Lopez continues to defer a dietician appointment.  He has been eating well, but admits to cheating on occasion wiht country ham and fried shrimp.  He would like to eat more red meat but know that it is not good for him.  Manus Gunning help him keep an eye on his diet and making sure that he eats good.   Expected Outcome Short: Try not to cheat as much on diet.  Long: Continue healty eating      Psychosocial: Target Goals: Acknowledge presence or absence of significant depression and/or stress, maximize coping skills, provide positive support system. Participant is able to verbalize types and ability to use techniques and skills  needed for reducing stress and depression.   Initial Review & Psychosocial Screening:     Initial Psych Review & Screening - 02/11/17 1446  Initial Review   Current issues with None Identified     Family Dynamics   Good Support System? Yes     Barriers   Psychosocial barriers to participate in program There are no identifiable barriers or psychosocial needs.     Screening Interventions   Interventions Encouraged to exercise;To provide support and resources with identified psychosocial needs;Yes   Expected Outcomes Short Term goal: Utilizing psychosocial counselor, staff and physician to assist with identification of specific Stressors or current issues interfering with healing process. Setting desired goal for each stressor or current issue identified.;Long Term Goal: Stressors or current issues are controlled or eliminated.;Short Term goal: Identification and review with participant of any Quality of Life or Depression concerns found by scoring the questionnaire.;Long Term goal: The participant improves quality of Life and PHQ9 Scores as seen by post scores and/or verbalization of changes      Quality of Life Scores:   PHQ-9: Recent Review Flowsheet Data    Depression screen [facility] 2/9 02/11/2017   Decreased Interest 2   Down, Depressed, Hopeless 2   PHQ - 2 Score 4   Altered sleeping 0   Tired, decreased energy 2   Change in appetite 0   Feeling bad or failure about yourself  0   Trouble concentrating 0   Moving slowly or fidgety/restless 0   Suicidal thoughts 0   PHQ-9 Score 6   Difficult doing work/chores Somewhat difficult     Interpretation of Total Score  Total Score Depression Severity:  1-4 = Minimal depression, 5-9 = Mild depression, 10-14 = Moderate depression, 15-19 = Moderately severe depression, 20-27 = Severe depression   Psychosocial Evaluation and Intervention:     Psychosocial Evaluation - 02/19/17 1217      Psychosocial Evaluation & Interventions    Interventions Encouraged to exercise with the program and follow exercise prescription;Relaxation education   Comments Counselor met with Darin Lopez today for initial psychosocial evaluation.  He is a 76 year old who is diagnosed with COPD.  Darin Lopez has a strong support system with a spouse of 20 years and several adult children.  He is also actively involved in his local church.  Darin Lopez sleeps well and has a good appetite.  He is on medications for sleep and depression/anxiety and reports it is working well.  Darin Lopez states his mood is generally positive and he has minimal stress in his life at this time - other than his COPD.  His goals for this program are to breathe better and have a better quality of life.     Expected Outcomes Darin Lopez will benefit from consistent exercise to achieve his stated goals. The educational and psychoeducational components of this program will be helpful in [place] learning more about his illness and how to manage it better.     Continue Psychosocial Services  Follow up required by staff      Psychosocial Re-Evaluation:     Psychosocial Re-Evaluation    Row Name 03/07/17 1410             Psychosocial Re-Evaluation   Current issues with None Identified       Comments Darin Lopez continues to maintain a positive attitude.  His biggest stressor is just that as he gets older his health is not doing well.  He would like to be able to breathe better.  They are working on getting him a Building surveyor that should hlep.  He says that having Drenda Freeze for his support has been the  best thing.  He has enjoyed coming to rehab and enjoys the exercise and the people he gets to meet and interact with while here.        Expected Outcomes Short: Continue to exercise and come to class for moral support.  Long: Maintain postive attitude       Interventions Encouraged to attend Pulmonary Rehabilitation for the exercise;Stress management education;Relaxation education       Continue Psychosocial Services   Follow up required by staff          Psychosocial Discharge (Final Psychosocial Re-Evaluation):     Psychosocial Re-Evaluation - 03/07/17 1410      Psychosocial Re-Evaluation   Current issues with None Identified   Comments Darin Lopez continues to maintain a positive attitude.  His biggest stressor is just that as he gets older his health is not doing well.  He would like to be able to breathe better.  They are working on getting him a Building surveyor that should hlep.  He says that having Drenda Freeze for his support has been the best thing.  He has enjoyed coming to rehab and enjoys the exercise and the people he gets to meet and interact with while here.    Expected Outcomes Short: Continue to exercise and come to class for moral support.  Long: Maintain postive attitude   Interventions Encouraged to attend Pulmonary Rehabilitation for the exercise;Stress management education;Relaxation education   Continue Psychosocial Services  Follow up required by staff      Education: Education Goals: Education classes will be provided on a weekly basis, covering required topics. Participant will state understanding/return demonstration of topics presented.  Learning Barriers/Preferences:     Learning Barriers/Preferences - 02/11/17 1510      Learning Barriers/Preferences   Learning Barriers Sight   Learning Preferences Individual Instruction;Skilled Demonstration;Verbal Instruction      Education Topics: Initial Evaluation Education: - Verbal, written and demonstration of respiratory meds, RPE/PD scales, oximetry and breathing techniques. Instruction on use of nebulizers and MDIs: cleaning and proper use, rinsing mouth with steroid doses and importance of monitoring MDI activations.   Pulmonary Rehab from 03/21/2017 in [facility] Cardiac and Pulmonary Rehab  Date  02/11/17  Educator  [facility]  Instruction Review Code (retired)  2- meets goals/outcomes  Instruction Review Code  1- Occupational psychologist Nutrition Guidelines/Fats and Fiber: -Group instruction provided by verbal, written material, models and posters to present the general guidelines for heart healthy nutrition. Gives an explanation and review of dietary fats and fiber.   Controlling Sodium/Reading Food Labels: -Group verbal and written material supporting the discussion of sodium use in heart healthy nutrition. Review and explanation with models, verbal and written materials for utilization of the food label.   Pulmonary Rehab from 03/21/2017 in [facility] Cardiac and Pulmonary Rehab  Date  02/24/17  Educator  CR  Instruction Review Code  1- Verbalizes Understanding      Exercise Physiology & Risk Factors: - Group verbal and written instruction with models to review the exercise physiology of the cardiovascular system and associated critical values. Details cardiovascular disease risk factors and the goals associated with each risk factor.   Aerobic Exercise & Resistance Training: - Gives group verbal and written discussion on the health impact of inactivity. On the components of aerobic and resistive training programs and the benefits of this training and how to safely progress through these programs.   Flexibility, Balance, General Exercise Guidelines: - Provides group verbal  and written instruction on the benefits of flexibility and balance training programs. Provides general exercise guidelines with specific guidelines to those with heart or lung disease. Demonstration and skill practice provided.   Stress Management: - Provides group verbal and written instruction about the health risks of elevated stress, cause of high stress, and healthy ways to reduce stress.   Pulmonary Rehab from 03/21/2017 in [facility] Cardiac and Pulmonary Rehab  Date  03/19/17  Educator  [facility]  Instruction Review Code  1- Verbalizes Understanding      Depression: - Provides group verbal and written instruction on the  correlation between heart/lung disease and depressed mood, treatment options, and the stigmas associated with seeking treatment.   Pulmonary Rehab from 03/21/2017 in [facility] Cardiac and Pulmonary Rehab  Date  02/19/17  Educator  [facility]  Instruction Review Code (retired)  2- meets goals/outcomes  Instruction Review Code  1- Science writer Understanding      Exercise & Equipment Safety: - Individual verbal instruction and demonstration of equipment use and safety with use of the equipment.   Pulmonary Rehab from 03/21/2017 in [facility] Cardiac and Pulmonary Rehab  Date  02/11/17  Educator  [facility]  Instruction Review Code  1- Verbalizes Understanding      Infection Prevention: - Provides verbal and written material to individual with discussion of infection control including proper hand washing and proper equipment cleaning during exercise session.   Pulmonary Rehab from 03/21/2017 in [facility] Cardiac and Pulmonary Rehab  Date  02/11/17  Educator  [facility]  Instruction Review Code  2- Demonstrated Understanding      Falls Prevention: - Provides verbal and written material to individual with discussion of falls prevention and safety.   Pulmonary Rehab from 03/21/2017 in [facility] Cardiac and Pulmonary Rehab  Date  02/11/17  Educator  [facility]  Instruction Review Code (retired)  2- meets goals/outcomes  Instruction Review Code  1- Science writer Understanding      Diabetes: - Individual verbal and written instruction to review signs/symptoms of diabetes, desired ranges of glucose level fasting, after meals and with exercise. Advice that pre and post exercise glucose checks will be done for 3 sessions at entry of program.   Chronic Lung Diseases: - Group verbal and written instruction to review new updates, new respiratory medications, new advancements in procedures and treatments. Provide informative websites and "800" numbers of self-education.   Lung Procedures: - Group verbal and written instruction to describe testing  methods done to diagnose lung disease. Review the outcome of test results. Describe the treatment choices: Pulmonary Function Tests, ABGs and oximetry.   Energy Conservation: - Provide group verbal and written instruction for methods to conserve energy, plan and organize activities. Instruct on pacing techniques, use of adaptive equipment and posture/positioning to relieve shortness of breath.   Pulmonary Rehab from 03/21/2017 in [facility] Cardiac and Pulmonary Rehab  Date  03/12/17  Educator  [facility]  Instruction Review Code  1- Verbalizes Understanding      Triggers: - Group verbal and written instruction to review types of environmental controls: home humidity, furnaces, filters, dust mite/pet prevention, HEPA vacuums. To discuss weather changes, air quality and the benefits of nasal washing.   Exacerbations: - Group verbal and written instruction to provide: warning signs, infection symptoms, calling MD promptly, preventive modes, and value of vaccinations. Review: effective airway clearance, coughing and/or vibration techniques. Create an Sports administrator.   Oxygen: - Individual and group verbal and written instruction on oxygen therapy. Includes supplement oxygen, available portable oxygen systems,  continuous and intermittent flow rates, oxygen safety, concentrators, and Medicare reimbursement for oxygen.   Respiratory Medications: - Group verbal and written instruction to review medications for lung disease. Drug class, frequency, complications, importance of spacers, rinsing mouth after steroid MDI's, and proper cleaning methods for nebulizers.   AED/CPR: - Group verbal and written instruction with the use of models to demonstrate the basic use of the AED with the basic ABC's of resuscitation.   Pulmonary Rehab from 03/21/2017 in [facility] Cardiac and Pulmonary Rehab  Date  03/21/17  Educator  CE  Instruction Review Code  1- Verbalizes Understanding      Breathing Retraining: - Provides  individuals verbal and written instruction on purpose, frequency, and proper technique of diaphragmatic breathing and pursed-lipped breathing. Applies individual practice skills.   Anatomy and Physiology of the Lungs: - Group verbal and written instruction with the use of models to provide basic lung anatomy and physiology related to function, structure and complications of lung disease.   Anatomy & Physiology of the Heart: - Group verbal and written instruction and models provide basic cardiac anatomy and physiology, with the coronary electrical and arterial systems. Review of: AMI, Angina, Valve disease, Heart Failure, Cardiac Arrhythmia, Pacemakers, and the ICD.   Heart Failure: - Group verbal and written instruction on the basics of heart failure: signs/symptoms, treatments, explanation of ejection fraction, enlarged heart and cardiomyopathy.   Sleep Apnea: - Individual verbal and written instruction to review Obstructive Sleep Apnea. Review of risk factors, methods for diagnosing and types of masks and machines for OSA.   Anxiety: - Provides group, verbal and written instruction on the correlation between heart/lung disease and anxiety, treatment options, and management of anxiety.   Pulmonary Rehab from 03/21/2017 in [facility] Cardiac and Pulmonary Rehab  Date  03/19/17  Educator  [facility]  Instruction Review Code  1- Verbalizes Understanding      Relaxation: - Provides group, verbal and written instruction about the benefits of relaxation for patients with heart/lung disease. Also provides patients with examples of relaxation techniques.   Cardiac Medications: - Group verbal and written instruction to review commonly prescribed medications for heart disease. Reviews the medication, class of the drug, and side effects.   Pulmonary Rehab from 03/21/2017 in [facility] Cardiac and Pulmonary Rehab  Date  03/07/17  Educator  CE  Instruction Review Code  1- Verbalizes Understanding      Know  Your Numbers: -Group verbal and written instruction about important numbers in your health.  Review of Cholesterol, Blood Pressure, Diabetes, and BMI and the role they play in your overall health.   Other: -Provides group and verbal instruction on various topics (see comments)    Knowledge Questionnaire Score:     Knowledge Questionnaire Score - 02/11/17 1448      Knowledge Questionnaire Score   Pre Score 7/10       Core Components/Risk Factors/Patient Goals at Admission:     Personal Goals and Risk Factors at Admission - 02/11/17 1515      Core Components/Risk Factors/Patient Goals on Admission    Weight Management Yes;Weight Loss   Intervention Weight Management: Develop a combined nutrition and exercise program designed to reach desired caloric intake, while maintaining appropriate intake of nutrient and fiber, sodium and fats, and appropriate energy expenditure required for the weight goal.;Weight Management: Provide education and appropriate resources to help participant work on and attain dietary goals.;Weight Management/Obesity: Establish reasonable short term and long term weight goals.   Admit Weight 202 lb  4.8 oz (91.8 kg)   Goal Weight: Short Term 197 lb (89.4 kg)   Goal Weight: Long Term 192 lb (87.1 kg)   Expected Outcomes Short Term: Continue to assess and modify interventions until short term weight is achieved;Long Term: Adherence to nutrition and physical activity/exercise program aimed toward attainment of established weight goal;Weight Loss: Understanding of general recommendations for a balanced deficit meal plan, which promotes 1-2 lb weight loss per week and includes a negative energy balance of [phone] kcal/d;Understanding recommendations for meals to include 15-35% energy as protein, 25-35% energy from fat, 35-60% energy from carbohydrates, less than '200mg'$  of dietary cholesterol, 20-35 gm of total fiber daily;Understanding of distribution of calorie intake  throughout the day with the consumption of 4-5 meals/snacks   Hypertension Yes   Intervention Provide education on lifestyle modifcations including regular physical activity/exercise, weight management, moderate sodium restriction and increased consumption of fresh fruit, vegetables, and low fat dairy, alcohol moderation, and smoking cessation.;Monitor prescription use compliance.   Expected Outcomes Short Term: Continued assessment and intervention until BP is < 140/25m HG in hypertensive participants. < 130/86mHG in hypertensive participants with diabetes, heart failure or chronic kidney disease.;Long Term: Maintenance of blood pressure at goal levels.   Lipids Yes   Intervention Provide education and support for participant on nutrition & aerobic/resistive exercise along with prescribed medications to achieve LDL '70mg'$ , HDL >'40mg'$ .   Expected Outcomes Short Term: Participant states understanding of desired cholesterol values and is compliant with medications prescribed. Participant is following exercise prescription and nutrition guidelines.;Long Term: Cholesterol controlled with medications as prescribed, with individualized exercise RX and with personalized nutrition plan. Value goals: LDL < '70mg'$ , HDL > 40 mg.      Core Components/Risk Factors/Patient Goals Review:      Goals and Risk Factor Review    Row Name 03/07/17 1250             Core Components/Risk Factors/Patient Goals Review   Personal Goals Review Weight Management/Obesity;Hypertension;Lipids;Improve shortness of breath with ADL's       Review KeYvone Neuas been doing well in rehab.  His weight has been steady since starting the program.  Darin Lopez's blood pressures have been good and he continues to monitor them at home. He has not had any problems with his medications and lipids seem to be doing well.   Darin Lopez's biggest problems continues to be his shortness of breath and being able to do stuff around the house.         Expected Outcomes  Short: Continue to work on improving his SOB.  Long: Continue to work on risk factor modification          Core Components/Risk Factors/Patient Goals at Discharge (Final Review):      Goals and Risk Factor Review - 03/07/17 1250      Core Components/Risk Factors/Patient Goals Review   Personal Goals Review Weight Management/Obesity;Hypertension;Lipids;Improve shortness of breath with ADL's   Review KeYvone Neuas been doing well in rehab.  His weight has been steady since starting the program.  Darin Lopez's blood pressures have been good and he continues to monitor them at home. He has not had any problems with his medications and lipids seem to be doing well.   Darin Lopez's biggest problems continues to be his shortness of breath and being able to do stuff around the house.     Expected Outcomes Short: Continue to work on improving his SOB.  Long: Continue to work on risk factor modification  ITP Comments:     ITP Comments    Row Name 02/11/17 1651 02/21/17 1152 02/24/17 0841 02/26/17 1525 02/28/17 1233   ITP Comments Medical Evaluation Completed. Chart sent for signature and review to Dr. Emily Filbert Director of [place]. Visit diagnosis can be found in [facility] encounter 02/11/17 Patient came in today with spo2 in the low 80's. Informed patient that he may need 4 liters continuous instead of pulsed oxygen. Worked with patient on PLB techniques. He recovers when he rest but requires 6 liters when exercising. 30 day review completed. ITP sent to Dr. Emily Filbert Director of [place]. Continue with ITP unless changes are made by physician.   Darin Lopez came in today at 83% on his home tank after walking in from the parking lot.  He refuses to use valet or to be dropped off at the curb by his wife.  He needs a portable tank or concentrator that can handle 6L continuous flow like he is on in rehab.  We talked about the importance of discussing with his doctor tomorrow at his pulmonolgy appointment.  He and his wife voiced  understanding and will bring it up at their appointment.  Darin Lopez went to [place] yesterday for pulmonary appointment.  He was approved to go on continuous flow for exercise at home.  He was told he will need a few more test before they get the right equipment for him.  We will wait until he has portable continuous flow before initiating home exercise.    [place] Name 03/24/17 0839           ITP Comments 30 day review completed. ITP sent to Dr. Emily Filbert Director of [place]. Continue with ITP unless changes are made by physician.            Comments: 30 day review

## 2017-03-24 NOTE — Progress Notes (Signed)
Daily Session Note  Patient Details  Name: Darin Lopez MRN: [id number] Date of Birth: Sep 10, 1940 Referring Provider:     Pulmonary Rehab from 02/11/2017 in [place] and Pulmonary Rehab  Referring Provider  [facility]      Encounter Date: 03/24/2017  Check In:     Session Check In - 03/24/17 1149      Check-In   Staff Present Nada Maclachlan, BA, ACSM CEP, Exercise Physiologist;Kelly Amedeo Plenty, BS, ACSM CEP, Exercise Physiologist;Niah Heinle Flavia Shipper   Supervising physician immediately available to respond to emergencies [facility] immediately available ER MD   Physician(s) Dr. Clearnce Hasten and [facility]   Medication changes reported     No   Fall or balance concerns reported    No   Warm-up and Cool-down Performed as group-led instruction   Resistance Training Performed Yes   VAD Patient? No     Pain Assessment   Currently in Pain? No/denies   Multiple Pain Sites No         History  Smoking Status  . Former Smoker  . Packs/day: 3.00  . Years: 30.00  . Types: Cigarettes  Smokeless Tobacco  . Never Used    Comment: Quit over 35 yrs ago    Goals Met:  Independence with exercise equipment Exercise tolerated well No report of cardiac concerns or symptoms Strength training completed today  Goals Unmet:  Not Applicable  Comments: Pt able to follow exercise prescription today without complaint.  Will continue to monitor for progression.   Dr. Emily Filbert is Medical Director for [place] [facility] and [facility] Pulmonary Rehabilitation.

## 2017-03-26 DIAGNOSIS — J449 Chronic obstructive pulmonary disease, unspecified: Secondary | ICD-10-CM | POA: Diagnosis not present

## 2017-03-26 NOTE — Progress Notes (Signed)
Daily Session Note  Patient Details  Name: Darin Lopez MRN: [id number] Date of Birth: [birth year] Referring Provider:     Pulmonary Rehab from 02/11/2017 in [place] and Pulmonary Rehab  Referring Provider  [place]      Encounter Date: 03/26/2017  Check In:     Session Check In - 03/26/17 1132      Check-In   Location [facility]-Cardiac & Pulmonary Rehab   Staff Present Alberteen Sam, MA, ACSM RCEP, Exercise Physiologist;Zahid Carneiro Alcus Dad, RN BSN   Supervising physician immediately available to respond to emergencies [facility] immediately available ER MD   Physician(s) Dr. Joni Fears and Burlene Arnt   Medication changes reported     No   Fall or balance concerns reported    No   Warm-up and Cool-down Performed as group-led instruction   Resistance Training Performed Yes   VAD Patient? No     Pain Assessment   Currently in Pain? No/denies   Multiple Pain Sites No           Exercise Prescription Changes - 03/25/17 1500      Response to Exercise   Blood Pressure (Admit) 110/84   Blood Pressure (Exit) 100/74   Heart Rate (Admit) 74 bpm   Heart Rate (Exercise) 115 bpm   Heart Rate (Exit) 89 bpm   Oxygen Saturation (Admit) 91 %   Oxygen Saturation (Exercise) 88 %   Oxygen Saturation (Exit) 95 %   Rating of Perceived Exertion (Exercise) 17   Perceived Dyspnea (Exercise) 3   Symptoms SON   Comments satuartions have improved   Duration Continue with 45 min of aerobic exercise without signs/symptoms of physical distress.   Intensity THRR unchanged     Progression   Progression Continue to progress workloads to maintain intensity without signs/symptoms of physical distress.   Average METs 2.48     Resistance Training   Training Prescription Yes   Weight 4 lbs   Reps 10-15     Interval Training   Interval Training No     Oxygen   Oxygen Continuous   Liters 6  pendant     Treadmill   MPH 2   Grade 0   Minutes 15   METs 2.53     Arm  Ergometer   Level 1   RPM 38   Minutes 15   METs 2.3     REL-XR   Level 1   Minutes 15   METs 2.6     Home Exercise Plan   Plans to continue exercise at Home (comment)  walking   Frequency Add 1 additional day to program exercise sessions.  once he gets new concentrator   Initial Home Exercises Provided 03/07/17      History  Smoking Status  . Former Smoker  . Packs/day: 3.00  . Years: 30.00  . Types: Cigarettes  Smokeless Tobacco  . Never Used    Comment: Quit over 35 yrs ago    Goals Met:  Independence with exercise equipment Exercise tolerated well No report of cardiac concerns or symptoms Strength training completed today  Goals Unmet:  Not Applicable  Comments: Pt able to follow exercise prescription today without complaint.  Will continue to monitor for progression.   Dr. Emily Filbert is Medical Director for [place] and [facility] Pulmonary Rehabilitation.

## 2017-03-28 ENCOUNTER — Encounter: Payer: Non-veteran care | Admitting: *Deleted

## 2017-03-28 DIAGNOSIS — J449 Chronic obstructive pulmonary disease, unspecified: Secondary | ICD-10-CM | POA: Diagnosis not present

## 2017-03-28 NOTE — Progress Notes (Signed)
Daily Session Note  Patient Details  Name: KELTON BULTMAN MRN: [id number] Date of Birth: 08-Oct-1940 Referring Provider:     Pulmonary Rehab from 02/11/2017 in [place] and Pulmonary Rehab  Referring Provider  [facility]      Encounter Date: 03/28/2017  Check In:     Session Check In - 03/28/17 1149      Check-In   Location [facility]   Staff Present Renita Papa, RN BSN;Joseph Christain [place], RN BSN   Supervising physician immediately available to respond to emergencies [facility] immediately available ER MD   Physician(s) Dr. Reita Cliche and [place]   Medication changes reported     No   Fall or balance concerns reported    No   Tobacco Cessation No Change   Warm-up and Cool-down Performed as group-led instruction   Resistance Training Performed Yes   VAD Patient? No     Pain Assessment   Currently in Pain? No/denies   Multiple Pain Sites No         History  Smoking Status  . Former Smoker  . Packs/day: 3.00  . Years: 30.00  . Types: Cigarettes  Smokeless Tobacco  . Never Used    Comment: Quit over 35 yrs ago    Goals Met:  Proper associated with RPD/PD & O2 Sat Independence with exercise equipment Using PLB without cueing & demonstrates good technique Exercise tolerated well Strength training completed today  Goals Unmet:  Not Applicable  Comments: Pt able to follow exercise prescription today without complaint.  Will continue to monitor for progression.    Dr. Emily Filbert is Medical Director for [place] and [facility].

## 2017-03-31 ENCOUNTER — Encounter: Payer: Non-veteran care | Attending: Family Medicine

## 2017-03-31 DIAGNOSIS — Z9981 Dependence on supplemental oxygen: Secondary | ICD-10-CM | POA: Insufficient documentation

## 2017-03-31 DIAGNOSIS — J449 Chronic obstructive pulmonary disease, unspecified: Secondary | ICD-10-CM | POA: Insufficient documentation

## 2017-03-31 DIAGNOSIS — Z6825 Body mass index (BMI) 25.0-25.9, adult: Secondary | ICD-10-CM | POA: Diagnosis not present

## 2017-03-31 DIAGNOSIS — R1314 Dysphagia, pharyngoesophageal phase: Secondary | ICD-10-CM | POA: Diagnosis not present

## 2017-03-31 DIAGNOSIS — C4492 Squamous cell carcinoma of skin, unspecified: Secondary | ICD-10-CM | POA: Diagnosis not present

## 2017-03-31 DIAGNOSIS — F329 Major depressive disorder, single episode, unspecified: Secondary | ICD-10-CM | POA: Insufficient documentation

## 2017-03-31 DIAGNOSIS — G4730 Sleep apnea, unspecified: Secondary | ICD-10-CM | POA: Insufficient documentation

## 2017-03-31 DIAGNOSIS — A0819 Acute gastroenteropathy due to other small round viruses: Secondary | ICD-10-CM | POA: Diagnosis not present

## 2017-03-31 DIAGNOSIS — Z7951 Long term (current) use of inhaled steroids: Secondary | ICD-10-CM | POA: Insufficient documentation

## 2017-03-31 DIAGNOSIS — K219 Gastro-esophageal reflux disease without esophagitis: Secondary | ICD-10-CM | POA: Insufficient documentation

## 2017-03-31 DIAGNOSIS — Z87891 Personal history of nicotine dependence: Secondary | ICD-10-CM | POA: Insufficient documentation

## 2017-03-31 DIAGNOSIS — Z79899 Other long term (current) drug therapy: Secondary | ICD-10-CM | POA: Insufficient documentation

## 2017-04-03 ENCOUNTER — Encounter: Payer: Self-pay | Admitting: Gastroenterology

## 2017-04-04 ENCOUNTER — Encounter: Payer: Non-veteran care | Admitting: *Deleted

## 2017-04-04 DIAGNOSIS — K219 Gastro-esophageal reflux disease without esophagitis: Secondary | ICD-10-CM | POA: Diagnosis not present

## 2017-04-04 DIAGNOSIS — J449 Chronic obstructive pulmonary disease, unspecified: Secondary | ICD-10-CM | POA: Diagnosis not present

## 2017-04-04 DIAGNOSIS — G4730 Sleep apnea, unspecified: Secondary | ICD-10-CM | POA: Diagnosis not present

## 2017-04-04 DIAGNOSIS — Z9981 Dependence on supplemental oxygen: Secondary | ICD-10-CM | POA: Diagnosis not present

## 2017-04-04 DIAGNOSIS — F329 Major depressive disorder, single episode, unspecified: Secondary | ICD-10-CM | POA: Diagnosis not present

## 2017-04-04 DIAGNOSIS — Z7951 Long term (current) use of inhaled steroids: Secondary | ICD-10-CM | POA: Diagnosis not present

## 2017-04-04 DIAGNOSIS — Z79899 Other long term (current) drug therapy: Secondary | ICD-10-CM | POA: Diagnosis not present

## 2017-04-04 DIAGNOSIS — Z87891 Personal history of nicotine dependence: Secondary | ICD-10-CM | POA: Diagnosis not present

## 2017-04-04 NOTE — Progress Notes (Signed)
Daily Session Note  Patient Details  Name: Darin Lopez MRN: [id number] Date of Birth: 12-Sep-1940 Referring Provider:     Pulmonary Rehab from 02/11/2017 in [place] and Pulmonary Rehab  Referring Provider  [facility]      Encounter Date: 04/04/2017  Check In:     Session Check In - 04/04/17 1123      Check-In   Location [facility]   Staff Present Renita Papa, RN BSN;Joseph Darrin Nipper, [place], ACSM RCEP, Exercise Physiologist   Supervising physician immediately available to respond to emergencies [facility] immediately available ER MD   Physician(s) Dr. Corky Downs and Reita Cliche   Medication changes reported     No   Fall or balance concerns reported    No   Warm-up and Cool-down Performed as group-led instruction   Resistance Training Performed Yes   VAD Patient? No     Pain Assessment   Currently in Pain? No/denies         History  Smoking Status  . Former Smoker  . Packs/day: 3.00  . Years: 30.00  . Types: Cigarettes  Smokeless Tobacco  . Never Used    Comment: Quit over 35 yrs ago    Goals Met:  Proper associated with RPD/PD & O2 Sat Independence with exercise equipment Using PLB without cueing & demonstrates good technique Exercise tolerated well Personal goals reviewed Strength training completed today  Goals Unmet:  Not Applicable  Comments: Pt able to follow exercise prescription today without complaint.  Will continue to monitor for progression.    Dr. Emily Filbert is Medical Director for [place] and [facility].

## 2017-04-07 DIAGNOSIS — J449 Chronic obstructive pulmonary disease, unspecified: Secondary | ICD-10-CM

## 2017-04-07 LAB — GLUCOSE, CAPILLARY: Glucose-Capillary: 91 mg/dL (ref 65–99)

## 2017-04-07 NOTE — Progress Notes (Signed)
Daily Session Note  Patient Details  Name: Darin Lopez MRN: [id number] Date of Birth: 29-Mar-1941 Referring Provider:     Pulmonary Rehab from 02/11/2017 in [place] and Pulmonary Rehab  Referring Provider  [facility]      Encounter Date: 04/07/2017  Check In:     Session Check In - 04/07/17 1155      Check-In   Staff Present Nada Maclachlan, BA, ACSM CEP, Exercise Physiologist;Kelly Amedeo Plenty, BS, ACSM CEP, Exercise Physiologist;Joseph Flavia Shipper   Supervising physician immediately available to respond to emergencies [facility] immediately available ER MD   Physician(s) Dr. Jimmye Norman and [facility]   Medication changes reported     No   Fall or balance concerns reported    No   Warm-up and Cool-down Performed as group-led instruction   Resistance Training Performed Yes   VAD Patient? No     Pain Assessment   Currently in Pain? No/denies   Multiple Pain Sites No         History  Smoking Status  . Former Smoker  . Packs/day: 3.00  . Years: 30.00  . Types: Cigarettes  Smokeless Tobacco  . Never Used    Comment: Quit over 35 yrs ago    Goals Met:  Independence with exercise equipment Exercise tolerated well No report of cardiac concerns or symptoms Strength training completed today  Goals Unmet:  Not Applicable  Comments: Pt able to follow exercise prescription today without complaint.  Will continue to monitor for progression.   Dr. Emily Filbert is Medical Director for [place] and [facility].

## 2017-04-09 DIAGNOSIS — J449 Chronic obstructive pulmonary disease, unspecified: Secondary | ICD-10-CM

## 2017-04-09 NOTE — Progress Notes (Signed)
Daily Session Note  Patient Details  Name: Darin Lopez MRN: [id number] Date of Birth: 07/14/1940 Referring Provider:     Pulmonary Rehab from 02/11/2017 in [place] and Pulmonary Rehab  Referring Provider  [facility]      Encounter Date: 04/09/2017  Check In:     Session Check In - 04/09/17 1119      Check-In   Location [facility]   Staff Present Alberteen Sam, MA, ACSM RCEP, Exercise Physiologist;[name], RN BSN;Raistlin Gum Flavia Shipper   Supervising physician immediately available to respond to emergencies [facility] immediately available ER MD   Physician(s) Dr. Cinda Quest and Jimmye Norman   Medication changes reported     No   Fall or balance concerns reported    No   Warm-up and Cool-down Performed as group-led instruction   Resistance Training Performed Yes   VAD Patient? No     Pain Assessment   Currently in Pain? No/denies   Multiple Pain Sites No           Exercise Prescription Changes - 04/08/17 1600      Response to Exercise   Blood Pressure (Admit) 150/60   Blood Pressure (Exit) 104/60   Heart Rate (Admit) 82 bpm   Heart Rate (Exercise) 118 bpm   Heart Rate (Exit) 96 bpm   Oxygen Saturation (Admit) 95 %   Oxygen Saturation (Exercise) 89 %   Oxygen Saturation (Exit) 97 %   Rating of Perceived Exertion (Exercise) 17   Perceived Dyspnea (Exercise) 4   Symptoms SOB   Comments SOB on treadmill   Duration Continue with 45 min of aerobic exercise without signs/symptoms of physical distress.   Intensity THRR unchanged     Progression   Progression Continue to progress workloads to maintain intensity without signs/symptoms of physical distress.   Average METs 2.71     Resistance Training   Training Prescription Yes   Weight 4 lbs   Reps 10-15     Interval Training   Interval Training No     Oxygen   Oxygen Continuous   Liters 6  pendant     Treadmill   MPH 2   Grade 0   Minutes 15   METs 2.53     Arm  Ergometer   Level 1   RPM 48   Minutes 15   METs 2.5     REL-XR   Level 3   Speed 50   Minutes 15   METs 3.1     Home Exercise Plan   Plans to continue exercise at Home (comment)  walking   Frequency Add 1 additional day to program exercise sessions.   Initial Home Exercises Provided 03/07/17      History  Smoking Status  . Former Smoker  . Packs/day: 3.00  . Years: 30.00  . Types: Cigarettes  Smokeless Tobacco  . Never Used    Comment: Quit over 35 yrs ago    Goals Met:  Independence with exercise equipment Exercise tolerated well No report of cardiac concerns or symptoms Strength training completed today  Goals Unmet:  Not Applicable  Comments: Pt able to follow exercise prescription today without complaint.  Will continue to monitor for progression.   Dr. Emily Filbert is Medical Director for [place] and [facility].

## 2017-04-11 DIAGNOSIS — J449 Chronic obstructive pulmonary disease, unspecified: Secondary | ICD-10-CM

## 2017-04-11 NOTE — Progress Notes (Signed)
Daily Session Note  Patient Details  Name: Darin Lopez MRN: [id number] Date of Birth: March 26, 1941 Referring Provider:     Pulmonary Rehab from 02/11/2017 in [place] and Pulmonary Rehab  Referring Provider  [facility]      Encounter Date: 04/11/2017  Check In:     Session Check In - 04/11/17 1106      Check-In   Location [facility]   Staff Present Alberteen Sam, MA, ACSM RCEP, Exercise Physiologist;[name], RN BSN;Joseph Flavia Shipper   Supervising physician immediately available to respond to emergencies [facility] immediately available ER MD   Physician(s) Dr. Clearnce Hasten and Cinda Quest   Medication changes reported     No   Fall or balance concerns reported    No   Warm-up and Cool-down Performed as group-led instruction   Resistance Training Performed Yes   VAD Patient? No     Pain Assessment   Currently in Pain? No/denies   Multiple Pain Sites No         History  Smoking Status  . Former Smoker  . Packs/day: 3.00  . Years: 30.00  . Types: Cigarettes  Smokeless Tobacco  . Never Used    Comment: Quit over 35 yrs ago    Goals Met:  Independence with exercise equipment Exercise tolerated well No report of cardiac concerns or symptoms Strength training completed today  Goals Unmet:  Not Applicable  Comments: Pt able to follow exercise prescription today without complaint.  Will continue to monitor for progression.   Dr. Emily Filbert is Medical Director for [place] and [facility].

## 2017-04-14 DIAGNOSIS — J449 Chronic obstructive pulmonary disease, unspecified: Secondary | ICD-10-CM

## 2017-04-14 NOTE — Progress Notes (Signed)
Daily Session Note  Patient Details  Name: Darin Lopez MRN: [id number] Date of Birth: 10/24/1940 Referring Provider:     Pulmonary Rehab from 02/11/2017 in [place] and Pulmonary Rehab  Referring Provider  [facility]      Encounter Date: 04/14/2017  Check In:     Session Check In - 04/14/17 1110      Check-In   Location [facility]   Staff Present Earlean Shawl, BS, ACSM CEP, Exercise Physiologist;Laureen Owens Shark, BS, RRT, Respiratory Therapist;Thinh Cu[place] physician immediately available to respond to emergencies [facility] immediately available ER MD   Physician(s) Dr. Cherylann Banas and Corky Downs   Medication changes reported     No   Fall or balance concerns reported    No   Warm-up and Cool-down Performed as group-led instruction   Resistance Training Performed Yes   VAD Patient? No     Pain Assessment   Currently in Pain? No/denies   Multiple Pain Sites No         History  Smoking Status  . Former Smoker  . Packs/day: 3.00  . Years: 30.00  . Types: Cigarettes  Smokeless Tobacco  . Never Used    Comment: Quit over 35 yrs ago    Goals Met:  Proper associated with RPD/PD & O2 Sat Independence with exercise equipment Exercise tolerated well No report of cardiac concerns or symptoms Strength training completed today  Goals Unmet:  Not Applicable  Comments: Pt able to follow exercise prescription today without complaint.  Will continue to monitor for progression.   Dr. Emily Filbert is Medical Director for [place] and [facility].

## 2017-04-15 DIAGNOSIS — C4492 Squamous cell carcinoma of skin, unspecified: Secondary | ICD-10-CM | POA: Diagnosis not present

## 2017-04-15 DIAGNOSIS — Z6826 Body mass index (BMI) 26.0-26.9, adult: Secondary | ICD-10-CM | POA: Diagnosis not present

## 2017-04-15 DIAGNOSIS — L723 Sebaceous cyst: Secondary | ICD-10-CM | POA: Diagnosis not present

## 2017-04-15 DIAGNOSIS — C44612 Basal cell carcinoma of skin of right upper limb, including shoulder: Secondary | ICD-10-CM | POA: Diagnosis not present

## 2017-04-16 DIAGNOSIS — J449 Chronic obstructive pulmonary disease, unspecified: Secondary | ICD-10-CM | POA: Diagnosis not present

## 2017-04-16 NOTE — Progress Notes (Signed)
Daily Session Note  Patient Details  Name: Darin Lopez MRN: [id number] Date of Birth: March 30, 1941 Referring Provider:     Pulmonary Rehab from 02/11/2017 in [place] and Pulmonary Rehab  Referring Provider  [facility]      Encounter Date: 04/16/2017  Check In:     Session Check In - 04/16/17 1143      Check-In   Location [facility]   Staff Present Gerlene Burdock, RN, Geralyn Corwin, RN BSN;Kashana Breach Flavia Shipper   Supervising physician immediately available to respond to emergencies [facility] immediately available ER MD   Physician(s) Dr. Mariea Clonts and Joni Fears   Medication changes reported     No   Fall or balance concerns reported    No   Warm-up and Cool-down Performed as group-led instruction   Resistance Training Performed Yes   VAD Patient? No     Pain Assessment   Currently in Pain? No/denies   Multiple Pain Sites No         History  Smoking Status  . Former Smoker  . Packs/day: 3.00  . Years: 30.00  . Types: Cigarettes  Smokeless Tobacco  . Never Used    Comment: Quit over 35 yrs ago    Goals Met:  Independence with exercise equipment Exercise tolerated well No report of cardiac concerns or symptoms Strength training completed today  Goals Unmet:  Not Applicable  Comments: Pt able to follow exercise prescription today without complaint.  Will continue to monitor for progression.   Dr. Emily Lopez is Medical Director for [place] and [facility].

## 2017-04-17 DIAGNOSIS — K51019 Ulcerative (chronic) pancolitis with unspecified complications: Secondary | ICD-10-CM | POA: Diagnosis not present

## 2017-04-18 ENCOUNTER — Encounter: Payer: Non-veteran care | Admitting: *Deleted

## 2017-04-18 DIAGNOSIS — J449 Chronic obstructive pulmonary disease, unspecified: Secondary | ICD-10-CM | POA: Diagnosis not present

## 2017-04-18 NOTE — Progress Notes (Signed)
Daily Session Note  Patient Details  Name: Darin Lopez MRN: [id number] Date of Birth: 1940-10-11 Referring Provider:     Pulmonary Rehab from 02/11/2017 in [place] and Pulmonary Rehab  Referring Provider  [facility]      Encounter Date: 04/18/2017  Check In:     Session Check In - 04/18/17 1125      Check-In   Location [facility]   Staff Present Renita Papa, RN BSN;Joseph Christain [place], RN BSN   Supervising physician immediately available to respond to emergencies [facility] immediately available ER MD   Physician(s) Dr. Kerman Passey and Quentin Cornwall   Medication changes reported     No   Fall or balance concerns reported    No   Warm-up and Cool-down Performed as group-led instruction   Resistance Training Performed Yes   VAD Patient? No     Pain Assessment   Currently in Pain? No/denies         History  Smoking Status  . Former Smoker  . Packs/day: 3.00  . Years: 30.00  . Types: Cigarettes  Smokeless Tobacco  . Never Used    Comment: Quit over 35 yrs ago    Goals Met:  Proper associated with RPD/PD & O2 Sat Independence with exercise equipment Using PLB without cueing & demonstrates good technique Exercise tolerated well Strength training completed today  Goals Unmet:  Not Applicable  Comments: Pt able to follow exercise prescription today without complaint.  Will continue to monitor for progression.    Dr. Emily Filbert is Medical Director for [place] and [facility].

## 2017-04-21 DIAGNOSIS — J449 Chronic obstructive pulmonary disease, unspecified: Secondary | ICD-10-CM | POA: Diagnosis not present

## 2017-04-21 NOTE — Progress Notes (Signed)
Pulmonary Individual Treatment Plan  Patient Details  Name: Darin Lopez MRN: [id number] Date of Birth: January 23, 1941 Referring Provider:     Pulmonary Rehab from 02/11/2017 in [facility]  Referring Provider  [facility]      Initial Encounter Date:    Pulmonary Rehab [facility]/11/[facility]  Date  02/11/17  Referring Provider  [facility]      Visit Diagnosis: Chronic obstructive pulmonary disease, unspecified COPD type ([place])  Patient's Home Medications on Admission:  Current Outpatient Prescriptions:  .  acidophilus (RISAQUAD) CAPS capsule, Take 1 capsule by mouth daily., Disp: , Rfl:  .  albuterol (PROVENTIL HFA;VENTOLIN HFA) 108 (90 Base) MCG/ACT inhaler, Inhale 2 puffs into the lungs every 6 (six) hours as needed for wheezing or shortness of breath., Disp: 1 Inhaler, Rfl: 2 .  benzonatate (TESSALON PERLES) 100 MG capsule, Take 1 capsule (100 mg total) by mouth 3 (three) times daily as needed for cough., Disp: 20 capsule, Rfl: 0 .  Budesonide (UCERIS) 9 MG TB24, Take 9 mg by mouth daily., Disp: , Rfl:  .  budesonide-formoterol (SYMBICORT) 160-4.5 MCG/ACT inhaler, Inhale 2 puffs into the lungs 2 (two) times daily., Disp: , Rfl:  .  cephALEXin (KEFLEX) 500 MG capsule, Take 1 capsule (500 mg total) by mouth every 12 (twelve) hours., Disp: 10 capsule, Rfl: 0 .  chlorpheniramine-HYDROcodone (TUSSIONEX) 10-8 MG/5ML SUER, Take 5 mLs by mouth every 12 (twelve) hours as needed for cough., Disp: 115 mL, Rfl: 0 .  Coenzyme Q10 (CO Q 10) 60 MG CAPS, Take 60 mg by mouth daily., Disp: , Rfl:  .  diphenhydrAMINE (BENADRYL) 25 MG tablet, Take 25 mg by mouth at bedtime as needed for allergies or sleep. , Disp: , Rfl:  .  gabapentin (NEURONTIN) 300 MG capsule, Take 300 mg by mouth at bedtime. , Disp: , Rfl:  .  mercaptopurine (PURINETHOL) 50 MG tablet, Take 100 mg by mouth daily., Disp: , Rfl:  .  mesalamine (PENTASA) 250 MG CR capsule, Take 1,000 mg  by mouth daily. , Disp: , Rfl:  .  Multiple Vitamin (MULTIVITAMIN WITH MINERALS) TABS tablet, Take 1 tablet by mouth daily., Disp: , Rfl:  .  nitroGLYCERIN (NITROSTAT) 0.4 MG SL tablet, Place 0.4 mg under the tongue every 5 (five) minutes as needed for chest pain., Disp: , Rfl:  .  omeprazole (PRILOSEC) 20 MG capsule, Take 20 mg by mouth daily., Disp: , Rfl:  .  pravastatin (PRAVACHOL) 20 MG tablet, Take 20 mg by mouth at bedtime. , Disp: , Rfl:  .  predniSONE (DELTASONE) 10 MG tablet, Take 10 mg by mouth daily., Disp: , Rfl:  .  predniSONE (DELTASONE) 5 MG tablet, 40 mg po daily for 2 days, 20 mg po daily for 2 days, then continue home dose 10 mg po daily., Disp: 30 tablet, Rfl: 0 .  salmeterol (SEREVENT) 50 MCG/DOSE diskus inhaler, Inhale 1 puff into the lungs daily., Disp: , Rfl:  .  sertraline (ZOLOFT) 50 MG tablet, Take 100 mg by mouth daily. , Disp: , Rfl:  .  terazosin (HYTRIN) 10 MG capsule, Take 20 mg by mouth at bedtime. , Disp: , Rfl:  .  tiotropium (SPIRIVA) 18 MCG inhalation capsule, Place 1 capsule (18 mcg total) into inhaler and inhale daily., Disp: 30 capsule, Rfl: 2 .  zolpidem (AMBIEN) 10 MG tablet, Take 5-10 mg by mouth at bedtime as needed for sleep. , Disp: , Rfl:  Past Medical History: Past Medical History:  Diagnosis Date  . Colitis   . COPD (chronic obstructive pulmonary disease) ([place])   . Depression   . Emphysema   . Emphysema of lung ([place])   . GERD (gastroesophageal reflux disease)   . Heart murmur    "all my life"  . Hypertension    Hx - denies current issues  . On home oxygen therapy    4L - continuous  . Shortness of breath dyspnea   . Sleep apnea    CPAP  . Wears dentures    full upper and lower    Tobacco Use: History  Smoking Status  . Former Smoker  . Packs/day: 3.00  . Years: 30.00  . Types: Cigarettes  Smokeless Tobacco  . Never Used    Comment: Quit over 35 yrs ago    Labs: Recent Review Flowsheet Data    There is no flowsheet data  to display.       Pulmonary Assessment Scores:     Pulmonary Assessment Scores    Row Name 02/11/17 1448 03/26/17 1133       ADL UCSD   ADL Phase Entry Mid    SOB Score total 76 80    Rest 1 2    Walk 2 3    Stairs 4 4    Bath 2 2    Dress 2 3    Shop 4 4      CAT Score   CAT Score 30  -      mMRC Score   mMRC Score 3  -       Pulmonary Function Assessment:     Pulmonary Function Assessment - 02/11/17 1507      Initial Spirometry Results   FVC% 59 %   FEV1% 51 %   FEV1/FVC Ratio 63.43     Post Bronchodilator Spirometry Results   FVC% 64.31 %   FEV1% 58.14 %   FEV1/FVC Ratio 66.62     Breath   Bilateral Breath Sounds Clear;Decreased   Shortness of Breath Limiting activity;Yes      Exercise Target Goals:    Exercise Program Goal: Individual exercise prescription set with THRR, safety & activity barriers. Participant demonstrates ability to understand and report RPE using BORG scale, to self-measure pulse accurately, and to acknowledge the importance of the exercise prescription.  Exercise Prescription Goal: Starting with aerobic activity 30 plus minutes a day, 3 days per week for initial exercise prescription. Provide home exercise prescription and guidelines that participant acknowledges understanding prior to discharge.  Activity Barriers & Risk Stratification:     Activity Barriers & Cardiac Risk Stratification - 02/11/17 1617      Activity Barriers & Cardiac Risk Stratification   Activity Barriers Deconditioning;Muscular Weakness;Shortness of Breath      6 Minute Walk:     6 Minute Walk    Row Name 02/11/17 1612         6 Minute Walk   Phase Initial     Distance 722 feet     Walk Time 3.58 minutes     # of Rest Breaks 0  test stopped at 3:35 due to desaturation     MPH 2.29     METS 1.98     RPE 15     Perceived Dyspnea  3.5     VO2 Peak 6.93     Symptoms Yes (comment)     Comments SOB     Resting HR 95 bpm  Resting BP  126/66     Max Ex. HR 120 bpm     Max Ex. BP 132/74     2 Minute Post BP 126/56       Interval HR   Baseline HR (retired) 95     1 Minute HR 120     2 Minute HR 121     3 Minute HR 125     4 Minute HR 115  test stopped at 3:35 HR was 110     5 Minute HR 94     6 Minute HR 58     Interval Heart Rate? Yes       Interval Oxygen   Interval Oxygen? Yes     Baseline Oxygen Saturation % 92 %     Resting Liters of Oxygen 4 L  pulsed     1 Minute Oxygen Saturation % 87 %     1 Minute Liters of Oxygen 4 L     2 Minute Oxygen Saturation % 83 %     2 Minute Liters of Oxygen 4 L     3 Minute Oxygen Saturation % 79 %     3 Minute Liters of Oxygen 4 L     4 Minute Oxygen Saturation % 80 %  test stopped at 3:35 SaO2 was 78%     4 Minute Liters of Oxygen 4 L     5 Minute Oxygen Saturation % 90 %     5 Minute Liters of Oxygen 4 L     6 Minute Oxygen Saturation % 95 %     6 Minute Liters of Oxygen 4 L       Oxygen Initial Assessment:     Oxygen Initial Assessment - 02/11/17 1458      Home Oxygen   Home Oxygen Device Liquid Oxygen;Home Concentrator   Sleep Oxygen Prescription CPAP   Liters per minute 5   Home Exercise Oxygen Prescription Continuous   Liters per minute 5   Home at Rest Exercise Oxygen Prescription Continuous   Liters per minute 5   Compliance with Home Oxygen Use Yes     Initial 6 min Walk   Oxygen Used Pulsed;Liquid Oxygen   Liters per minute 4   Resting Oxygen Saturation  92 %   Exercise Oxygen Saturation  during 6 min walk 78 %     Program Oxygen Prescription   Program Oxygen Prescription Continuous;E-Tanks   Liters per minute 6   Comments Still desaturated during walk on 6L.  We will have the patient start with intermitten exercise, especially on the treadmill.      Intervention   Short Term Goals To learn and exhibit compliance with exercise, home and travel O2 prescription;To Learn and understand importance of maintaining oxygen saturations>88%;To  learn and understand importance of monitoring SPO2 with pulse oximeter and demonstrate accurate use of the pulse oximeter.;To learn and demonstrate proper purse lipped breathing techniques or other breathing techniques.;To learn and demonstrate proper use of respiratory medications   Long  Term Goals Exhibits compliance with exercise, home and travel O2 prescription;Maintenance of O2 saturations>88%;Compliance with respiratory medication;Demonstrates proper use of MDI's;Exhibits proper breathing techniques, such as purse lipped breathing or other method taught during program session;Verbalizes importance of monitoring SPO2 with pulse oximeter and return demonstration      Oxygen Re-Evaluation:     Oxygen Re-Evaluation    Row Name 03/07/17 1418 03/14/17 1025 03/26/17 1221 04/04/17 1224       Program  Oxygen Prescription   Program Oxygen Prescription Continuous;E-Tanks Continuous;E-Tanks Continuous;Liquid Oxygen;E-Tanks Continuous;E-Tanks;Liquid Oxygen    Liters per minute 6 6  10L walking 6 6    Comments  - Once pendant ordered, we will change to 6L for time  -  -      Home Oxygen   Home Oxygen Device Home Concentrator;Liquid Oxygen Home Concentrator;Liquid Oxygen Home Concentrator;E-Tanks;Liquid Oxygen Home Concentrator;Liquid Oxygen;E-Tanks    Sleep Oxygen Prescription CPAP CPAP CPAP CPAP    Liters per minute _0 Home Exercise Oxygen Prescription Pulsed Continuous Continuous Continuous    Liters per minute 4 6  pendant 6 6    Home at Rest Exercise Oxygen Prescription Continuous Continuous Continuous Continuous    Liters per minute 4 2  pendant 2 6    Compliance with Home Oxygen Use Yes Yes Yes Yes      Goals/Expected Outcomes   Short Term Goals To learn and exhibit compliance with exercise, home and travel O2 prescription;To learn and demonstrate proper use of respiratory medications;To learn and understand importance of maintaining oxygen saturations>88%;To learn and  demonstrate proper pursed lip breathing techniques or other breathing techniques.;To learn and understand importance of monitoring SPO2 with pulse oximeter and demonstrate accurate use of the pulse oximeter. To learn and exhibit compliance with exercise, home and travel O2 prescription To learn and understand importance of maintaining oxygen saturations>88%;To learn and exhibit compliance with exercise, home and travel O2 prescription;To learn and demonstrate proper use of respiratory medications;To learn and demonstrate proper pursed lip breathing techniques or other breathing techniques.;To learn and understand importance of monitoring SPO2 with pulse oximeter and demonstrate accurate use of the pulse oximeter. To learn and exhibit compliance with exercise, home and travel O2 prescription;To learn and understand importance of maintaining oxygen saturations>88%;To learn and demonstrate proper use of respiratory medications;To learn and demonstrate proper pursed lip breathing techniques or other breathing techniques.;To learn and understand importance of monitoring SPO2 with pulse oximeter and demonstrate accurate use of the pulse oximeter.    Long  Term Goals Exhibits compliance with exercise, home and travel O2 prescription;Maintenance of O2 saturations>88%;Compliance with respiratory medication;Verbalizes importance of monitoring SPO2 with pulse oximeter and return demonstration;Exhibits proper breathing techniques, such as pursed lip breathing or other method taught during program session;Demonstrates proper use of MDI's Exhibits compliance with exercise, home and travel O2 prescription Verbalizes importance of monitoring SPO2 with pulse oximeter and return demonstration;Exhibits proper breathing techniques, such as pursed lip breathing or other method taught during program session;Demonstrates proper use of MDI's;Maintenance of O2 saturations>88%;Compliance with respiratory medication;Exhibits compliance  with exercise, home and travel O2 prescription Verbalizes importance of monitoring SPO2 with pulse oximeter and return demonstration;Exhibits proper breathing techniques, such as pursed lip breathing or other method taught during program session;Demonstrates proper use of MDI's;Compliance with respiratory medication;Maintenance of O2 saturations>88%;Exhibits compliance with exercise, home and travel O2 prescription    Comments Darin Lopez has been compliant with his oxygen.  He is working with his doctor to get  a new portable concentrator to be able to use continous flow for exercise.  He does use his pulse oximeter at home to monitor his saturations.  Darin Lopez continues to gain proficiency at using PLB.  He continues to use his inhaler and has recently changed back to a different medication.  Previously, this med had given him a migraine, but so far so good.  He has not been using his spacer, but will look for it over the weekend.  Darin Lopez had his [place] appointment yesterday with pulmonary.  They increased his prescription see notes above.  He was ordered to start using a pendant or to increase liter flow to 10L on standard cannula. Darin Lopez states he bought a portable oxygen system that holds his liquid tank he currently has. He states his one now is too heavy for him to carry. He wasnts to bring it in to make sure everything works correctly. Informed him that we can check his device but he should ask his home care provider if iit is appropriate. Darin Lopez is checking checking his oxygen at home regularly. He is taking his medications as prescribed. He states his wife keeps him on track with taking his medications and wearing his oxygen. Darin Lopez has not been using PLB as much since he has not been feeling well. His protable oxygen machine is still giving him issues with it being so heavy    Goals/Expected Outcomes Short: Find spacer to use with inhaler.  Long: Get new Marine scientist.  Short: Get outfitted for new concentrator and  pendant, ask for pendant for use in rehab.  Long: Continued complaince and improved saturations with exertion.  Short: Work on PLB. Long:Be proficient with PLB Short: Work on Pursed lip breathing techniques. Long: Maintain oxygen staturations independently at home and use PLB        Oxygen Discharge (Final Oxygen Re-Evaluation):     Oxygen Re-Evaluation - 04/04/17 1224      Program Oxygen Prescription   Program Oxygen Prescription Continuous;E-Tanks;Liquid Oxygen   Liters per minute 6     Home Oxygen   Home Oxygen Device Home Concentrator;Liquid Oxygen;E-Tanks   Sleep Oxygen Prescription CPAP   Liters per minute 4   Home Exercise Oxygen Prescription Continuous   Liters per minute 6   Home at Rest Exercise Oxygen Prescription Continuous   Liters per minute 6   Compliance with Home Oxygen Use Yes     Goals/Expected Outcomes   Short Term Goals To learn and exhibit compliance with exercise, home and travel O2 prescription;To learn and understand importance of maintaining oxygen saturations>88%;To learn and demonstrate proper use of respiratory medications;To learn and demonstrate proper pursed lip breathing techniques or other breathing techniques.;To learn and understand importance of monitoring SPO2 with pulse oximeter and demonstrate accurate use of the pulse oximeter.   Long  Term Goals Verbalizes importance of monitoring SPO2 with pulse oximeter and return demonstration;Exhibits proper breathing techniques, such as pursed lip breathing or other method taught during program session;Demonstrates proper use of MDI's;Compliance with respiratory medication;Maintenance of O2 saturations>88%;Exhibits compliance with exercise, home and travel O2 prescription   Comments Darin Lopez is checking checking his oxygen at home regularly. He is taking his medications as prescribed. He states his wife keeps him on track with taking his medications and wearing his oxygen. Darin Lopez has not been using PLB as much  since he has not been feeling well. His protable oxygen machine is still giving him issues with it being so heavy   Goals/Expected Outcomes Short: Work on Pursed lip breathing techniques. Long: Maintain oxygen staturations independently at home and use PLB       Initial Exercise Prescription:     Initial Exercise Prescription - 02/11/17 1600      Date of Initial Exercise RX and Referring Provider   Date 02/11/17   Referring Provider [facility]     Oxygen   Oxygen Continuous   Liters 6     Treadmill   MPH 2  Grade 0   Minutes 8  2 min intervals x4   METs 2.53     Arm Ergometer   Level 2   Watts 29   RPM 25   Minutes 15   METs 2     REL-XR   Level 1   Speed 50   Minutes 15   METs 2     Prescription Details   Frequency (times per week) 3   Duration Progress to 45 minutes of aerobic exercise without signs/symptoms of physical distress     Intensity   THRR 40-80% of Max Heartrate 115-135   Ratings of Perceived Exertion 11-13   Perceived Dyspnea 0-4     Progression   Progression Continue to progress workloads to maintain intensity without signs/symptoms of physical distress.     Resistance Training   Training Prescription Yes   Weight 4 lbs   Reps 10-15      Perform Capillary Blood Glucose checks as needed.  Exercise Prescription Changes:     Exercise Prescription Changes    Row Name 02/11/17 1600 02/26/17 1500 03/07/17 1400 03/11/17 1400 03/25/17 1500     Response to Exercise   Blood Pressure (Admit) 126/66 122/64  - 122/70 110/84   Blood Pressure (Exercise) 132/74  -  -  -  -   Blood Pressure (Exit) 126/56 124/62  - 118/62 100/74   Heart Rate (Admit) 95 bpm 69 bpm  - 85 bpm 74 bpm   Heart Rate (Exercise) 125 bpm 111 bpm  - 103 bpm 115 bpm   Heart Rate (Exit) 58 bpm 93 bpm  - 88 bpm 89 bpm   Oxygen Saturation (Admit) 92 % 96 %  - 93 % 91 %   Oxygen Saturation (Exercise) 78 % 88 %  - 84 % 88 %   Oxygen Saturation (Exit) 95 % 96 %  - 96 % 95 %    Rating of Perceived Exertion (Exercise) 15 15  - 15 17   Perceived Dyspnea (Exercise) 3.5 4  - 4 3   Symptoms SOB SOB  - SOB SON   Comments walk test results  -  - desaturates on treadmill satuartions have improved   Duration  - Progress to 45 minutes of aerobic exercise without signs/symptoms of physical distress  - Continue with 45 min of aerobic exercise without signs/symptoms of physical distress. Continue with 45 min of aerobic exercise without signs/symptoms of physical distress.   Intensity  - THRR unchanged  - THRR unchanged THRR unchanged     Progression   Progression  - Continue to progress workloads to maintain intensity without signs/symptoms of physical distress.  - Continue to progress workloads to maintain intensity without signs/symptoms of physical distress. Continue to progress workloads to maintain intensity without signs/symptoms of physical distress.   Average METs  - 2.64  - 2.64 2.48     Resistance Training   Training Prescription  - Yes  - Yes Yes   Weight  - 4 lbs  - 4 lbs 4 lbs   Reps  - 10-15  - 10-15 10-15     Interval Training   Interval Training  - No  - No No     Oxygen   Oxygen  - Continuous  - Continuous Continuous   Liters  - 6  - 6 6  pendant     Treadmill   MPH  - 2  - 1.5 2   Grade  - 0  - 0 0  Minutes  - 15  still intermitten  - 15 15   METs  - 2.53  - 2.15 2.53     Arm Ergometer   Level  - 1  - 1 1   RPM  - 41  - 40 38   Minutes  - 15  - 15 15   METs  - 2.1  - 2.5 2.3     REL-XR   Level  - 1  - 1 1   Speed  - 48  - 43  -   Minutes  - 15  - 15 15   METs  - 3.3  - 3.5 2.6     Home Exercise Plan   Plans to continue exercise at  -  - Home (comment)  walking Home (comment)  walking Home (comment)  walking   Frequency  -  - Add 1 additional day to program exercise sessions.  once he gets new concentrator Add 1 additional day to program exercise sessions.  once he gets new concentrator Add 1 additional day to program exercise  sessions.  once he gets new concentrator   Initial Home Exercises Provided  -  - 03/07/17 03/07/17 03/07/17   Row Name 04/08/17 1600             Response to Exercise   Blood Pressure (Admit) 150/60       Blood Pressure (Exit) 104/60       Heart Rate (Admit) 82 bpm       Heart Rate (Exercise) 118 bpm       Heart Rate (Exit) 96 bpm       Oxygen Saturation (Admit) 95 %       Oxygen Saturation (Exercise) 89 %       Oxygen Saturation (Exit) 97 %       Rating of Perceived Exertion (Exercise) 17       Perceived Dyspnea (Exercise) 4       Symptoms SOB       Comments SOB on treadmill       Duration Continue with 45 min of aerobic exercise without signs/symptoms of physical distress.       Intensity THRR unchanged         Progression   Progression Continue to progress workloads to maintain intensity without signs/symptoms of physical distress.       Average METs 2.71         Resistance Training   Training Prescription Yes       Weight 4 lbs       Reps 10-15         Interval Training   Interval Training No         Oxygen   Oxygen Continuous       Liters 6  pendant         Treadmill   MPH 2       Grade 0       Minutes 15       METs 2.53         Arm Ergometer   Level 1       RPM 48       Minutes 15       METs 2.5         REL-XR   Level 3       Speed 50       Minutes 15       METs 3.1  Home Exercise Plan   Plans to continue exercise at Home (comment)  walking       Frequency Add 1 additional day to program exercise sessions.       Initial Home Exercises Provided 03/07/17          Exercise Comments:     Exercise Comments    Row Name 02/11/17 1617 02/19/17 1127         Exercise Comments Also completed walk test on 6L oxygen.  Test stopped at 3:15 when saturations dropped to 83%.  1 min 93%, 2 min 87%, 3 min 83%, 4 min 94% (seated).  We will start his exercise as no continuous.  He will start with 2 min intervals especially on the treadmill.  We will  see how he is able to handle seated exercise to determine if intermitten exercise is needed on there as well.  First full day of exercise!  Patient was oriented to gym and equipment including functions, settings, policies, and procedures.  Patient's individual exercise prescription and treatment plan were reviewed.  All starting workloads were established based on the results of the 6 minute walk test done at initial orientation visit.  The plan for exercise progression was also introduced and progression will be customized based on patient's performance and goals.         Exercise Goals and Review:     Exercise Goals    Row Name 02/11/17 1623             Exercise Goals   Increase Physical Activity Yes       Intervention Provide advice, education, support and counseling about physical activity/exercise needs.;Develop an individualized exercise prescription for aerobic and resistive training based on initial evaluation findings, risk stratification, comorbidities and participant's personal goals.       Expected Outcomes Achievement of increased cardiorespiratory fitness and enhanced flexibility, muscular endurance and strength shown through measurements of functional capacity and personal statement of participant.       Increase Strength and Stamina Yes       Intervention Provide advice, education, support and counseling about physical activity/exercise needs.;Develop an individualized exercise prescription for aerobic and resistive training based on initial evaluation findings, risk stratification, comorbidities and participant's personal goals.       Expected Outcomes Achievement of increased cardiorespiratory fitness and enhanced flexibility, muscular endurance and strength shown through measurements of functional capacity and personal statement of participant.          Exercise Goals Re-Evaluation :     Exercise Goals Re-Evaluation    Row Name 02/26/17 1525 03/07/17 1246 03/11/17 1456  03/25/17 1502 04/08/17 1513     Exercise Goal Re-Evaluation   Exercise Goals Review Increase Physical Activity;Increase Strength and Stamina Increase Physical Activity;Increase Strength and Stamina;Understanding of Exercise Prescription;Knowledge and understanding of Target Heart Rate Range (THRR);Able to understand and use rate of perceived exertion (RPE) scale;Able to understand and use Dyspnea scale;Able to check pulse independently Increase Physical Activity;Increase Strength and Stamina Increase Physical Activity;Increase Strength and Stamina Increase Physical Activity;Increase Strength and Stamina   Comments Darin Lopez is off to a good start in rehab.  He is limited in activity by his oxygen saturations.  When he is here on 6L, he does well, but just coming in on only 3-4L pulsed wears him out before he even gets started with exercsie.  He is up to 15 min continuous on both seated pieces of equipment.  We will continue to moniotor his progresion.  Reviewed  home exercise with pt today.  Pt plans to walk at home for exercise once he gets his new concentrator.  Reviewed THR, pulse, RPE, sign and symptoms and when to call 911 or MD.  Also discussed weather considerations and indoor options.  Pt voiced understanding. Darin Lopez continues to do well in rehab.  He continues to desaturate when walking even with 6L of oxygen.  We continue to await a new concentrator for home use.  He is up to 3.5 METs on the XR.  We will continue to monitor his progression. Darin Lopez has been doing well in rehab.  He sats have been better since using the pendant.  He is now up to 2.0 mph on the treadmill for 15 min!!  We will continue to monitor his progress.  Darin Lopez continues to do well in rehab.  He is going to bring a concentrator in that he picked up at a flea market to test with exercise tomorrow.  He has moved up to level 3 on the XR.  We will continue to monitor his progression.    Expected Outcomes Short: Darin Lopez will talk to his doctor about  increasing his O2 prescription and then we can discuss home exercise.  Long: Continue to build strength and stamina. Short: Darin Lopez will start to add in exercise once he gets a new portable concentrator that supports continuous flow.  Long: Continue to build strength and stamina.  Short: Increase workloads on seated equipment.  Long: Continue to build stamina on treadmill. Short: Continue to try to increase workloads.   Long: Continue to work on Animator.  Short: Increase workload on arm crank and look at concentrator.  Long: Continue to increase physical activity.    [place] Name 04/09/17 1309             Exercise Goal Re-Evaluation   Exercise Goals Review Understanding of Exercise Prescription       Comments Darin Lopez walked with his portable tank on the treadmill while checking his oxygen he stayed around 91-92 percent. He should be able to use for home exercise.       Expected Outcomes Short: use portable tank for home exercise. Long: Continue to exercise independently on portable tank          Discharge Exercise Prescription (Final Exercise Prescription Changes):     Exercise Prescription Changes - 04/08/17 1600      Response to Exercise   Blood Pressure (Admit) 150/60   Blood Pressure (Exit) 104/60   Heart Rate (Admit) 82 bpm   Heart Rate (Exercise) 118 bpm   Heart Rate (Exit) 96 bpm   Oxygen Saturation (Admit) 95 %   Oxygen Saturation (Exercise) 89 %   Oxygen Saturation (Exit) 97 %   Rating of Perceived Exertion (Exercise) 17   Perceived Dyspnea (Exercise) 4   Symptoms SOB   Comments SOB on treadmill   Duration Continue with 45 min of aerobic exercise without signs/symptoms of physical distress.   Intensity THRR unchanged     Progression   Progression Continue to progress workloads to maintain intensity without signs/symptoms of physical distress.   Average METs 2.71     Resistance Training   Training Prescription Yes   Weight 4 lbs   Reps 10-15     Interval  Training   Interval Training No     Oxygen   Oxygen Continuous   Liters 6  pendant     Treadmill   MPH 2   Grade 0  Minutes 15   METs 2.53     Arm Ergometer   Level 1   RPM 48   Minutes 15   METs 2.5     REL-XR   Level 3   Speed 50   Minutes 15   METs 3.1     Home Exercise Plan   Plans to continue exercise at Home (comment)  walking   Frequency Add 1 additional day to program exercise sessions.   Initial Home Exercises Provided 03/07/17      Nutrition:  Target Goals: Understanding of nutrition guidelines, daily intake of sodium <152m, cholesterol <2062m calories 30% from fat and 7% or less from saturated fats, daily to have 5 or more servings of fruits and vegetables.  Biometrics:     Pre Biometrics - 02/11/17 1623      Pre Biometrics   Height 6' 0.4" (1.839 m)   Weight 202 lb 4.8 oz (91.8 kg)   Waist Circumference 42.5 inches   Hip Circumference 39 inches   Waist to Hip Ratio 1.09 %   BMI (Calculated) 27.2       Nutrition Therapy Plan and Nutrition Goals:     Nutrition Therapy & Goals - 03/07/17 1409      Nutrition Therapy   RD appointment defered Yes      Nutrition Discharge: Rate Your Plate Scores:   Nutrition Goals Re-Evaluation:     Nutrition Goals Re-Evaluation    [place] 03/07/17 1409 04/04/17 1232           Goals   Current Weight  - 203 lb (92.1 kg)      Nutrition Goal Healthy Diet Eat as healthy as he can      Comment KeYvone Neuontinues to defer a dietician appointment.  He has been eating well, but admits to cheating on occasion wiht country ham and fried shrimp.  He would like to eat more red meat but know that it is not good for him.  FrManus Gunningelp him keep an eye on his diet and making sure that he eats good. KeYvone Neuefers the dietician appointment. KeAzalee Coursee has been eating well as of late except for yesterday.       Expected Outcome Short: Try not to cheat as much on diet.  Long: Continue healty eating Short: lose weight by  eating healthy. Long: Maintain eating healthy post [facility].         Nutrition Goals Discharge (Final Nutrition Goals Re-Evaluation):     Nutrition Goals Re-Evaluation - 04/04/17 1232      Goals   Current Weight 203 lb (92.1 kg)   Nutrition Goal Eat as healthy as he can   Comment KeYvone Neuefers the dietician appointment. KeAzalee Coursee has been eating well as of late except for yesterday.    Expected Outcome Short: lose weight by eating healthy. Long: Maintain eating healthy post [facility].      Psychosocial: Target Goals: Acknowledge presence or absence of significant depression and/or stress, maximize coping skills, provide positive support system. Participant is able to verbalize types and ability to use techniques and skills needed for reducing stress and depression.   Initial Review & Psychosocial Screening:     Initial Psych Review & Screening - 02/11/17 1446      Initial Review   Current issues with None Identified     Family Dynamics   Good Support System? Yes     Barriers   Psychosocial barriers to participate in program There are no  identifiable barriers or psychosocial needs.     Screening Interventions   Interventions Encouraged to exercise;To provide support and resources with identified psychosocial needs;Yes   Expected Outcomes Short Term goal: Utilizing psychosocial counselor, staff and physician to assist with identification of specific Stressors or current issues interfering with healing process. Setting desired goal for each stressor or current issue identified.;Long Term Goal: Stressors or current issues are controlled or eliminated.;Short Term goal: Identification and review with participant of any Quality of Life or Depression concerns found by scoring the questionnaire.;Long Term goal: The participant improves quality of Life and PHQ9 Scores as seen by post scores and/or verbalization of changes      Quality of Life Scores:   PHQ-9: Recent Review  Flowsheet Data    Depression screen [facility] 2/9 03/26/2017 02/11/2017   Decreased Interest 2 2   Down, Depressed, Hopeless 3 2   PHQ - 2 Score 5 4   Altered sleeping 0 0   Tired, decreased energy 3 2   Change in appetite 2 0   Feeling bad or failure about yourself  1 0   Trouble concentrating 0 0   Moving slowly or fidgety/restless 0 0   Suicidal thoughts 0 0   PHQ-9 Score 11 6   Difficult doing work/chores Somewhat difficult Somewhat difficult     Interpretation of Total Score  Total Score Depression Severity:  1-4 = Minimal depression, 5-9 = Mild depression, 10-14 = Moderate depression, 15-19 = Moderately severe depression, 20-27 = Severe depression   Psychosocial Evaluation and Intervention:     Psychosocial Evaluation - 02/19/17 1217      Psychosocial Evaluation & Interventions   Interventions Encouraged to exercise with the program and follow exercise prescription;Relaxation education   Comments Counselor met with Mr. Cisek today for initial psychosocial evaluation.  He is a 76 year old who is diagnosed with COPD.  Darin Lopez has a strong support system with a spouse of 20 years and several adult children.  He is also actively involved in his local church.  Darin Lopez sleeps well and has a good appetite.  He is on medications for sleep and depression/anxiety and reports it is working well.  Darin Lopez states his mood is generally positive and he has minimal stress in his life at this time - other than his COPD.  His goals for this program are to breathe better and have a better quality of life.     Expected Outcomes Darin Lopez will benefit from consistent exercise to achieve his stated goals. The educational and psychoeducational components of this program will be helpful in [place] learning more about his illness and how to manage it better.     Continue Psychosocial Services  Follow up required by staff      Psychosocial Re-Evaluation:     Psychosocial Re-Evaluation    [place] Name 03/07/17 1410 04/04/17 1237            Psychosocial Re-Evaluation   Current issues with None Identified Current Stress Concerns      Comments Darin Lopez continues to maintain a positive attitude.  His biggest stressor is just that as he gets older his health is not doing well.  He would like to be able to breathe better.  They are working on getting him a Armed forces operational officer that should hlep.  He says that having Darin Lopez for his support has been the best thing.  He has enjoyed coming to rehab and enjoys the exercise and the people he gets to meet  and interact with while here.  Darin Lopez is stressed with his oxygen tank being heavy. He does not want to be seen in public rolling an oxygen tank arooung so he deals with it. Informed him that with his COPD he needs to use something easier to carry so he does not get so short of breath. Explained to him that he is not alone in his endeavor.       Expected Outcomes Short: Continue to exercise and come to class for moral support.  Long: Maintain postive attitude Short:get a lighter oxygen tank to boost moral. Long: Understand his disease process and how to maintain it with minimal stress      Interventions Encouraged to attend Pulmonary Rehabilitation for the exercise;Stress management education;Relaxation education Encouraged to attend Pulmonary Rehabilitation for the exercise;Stress management education;Relaxation education      Continue Psychosocial Services  Follow up required by staff Follow up required by staff         Psychosocial Discharge (Final Psychosocial Re-Evaluation):     Psychosocial Re-Evaluation - 04/04/17 1237      Psychosocial Re-Evaluation   Current issues with Current Stress Concerns   Comments Darin Lopez is stressed with his oxygen tank being heavy. He does not want to be seen in public rolling an oxygen tank arooung so he deals with it. Informed him that with his COPD he needs to use something easier to carry so he does not get so short of breath. Explained to him that he is not  alone in his endeavor.    Expected Outcomes Short:get a lighter oxygen tank to boost moral. Long: Understand his disease process and how to maintain it with minimal stress   Interventions Encouraged to attend Pulmonary Rehabilitation for the exercise;Stress management education;Relaxation education   Continue Psychosocial Services  Follow up required by staff      Education: Education Goals: Education classes will be provided on a weekly basis, covering required topics. Participant will state understanding/return demonstration of topics presented.  Learning Barriers/Preferences:     Learning Barriers/Preferences - 02/11/17 1510      Learning Barriers/Preferences   Learning Barriers Sight   Learning Preferences Individual Instruction;Skilled Demonstration;Verbal Instruction      Education Topics: Initial Evaluation Education: - Verbal, written and demonstration of respiratory meds, RPE/PD scales, oximetry and breathing techniques. Instruction on use of nebulizers and MDIs: cleaning and proper use, rinsing mouth with steroid doses and importance of monitoring MDI activations.   Pulmonary Rehab from 04/16/2017 in [facility] Cardiac and Pulmonary Rehab  Date  02/11/17  Educator  [facility]  Instruction Review Code (retired)  2- meets goals/outcomes  Instruction Review Code  1- IT trainer Nutrition Guidelines/Fats and Fiber: -Group instruction provided by verbal, written material, models and posters to present the general guidelines for heart healthy nutrition. Gives an explanation and review of dietary fats and fiber.   Pulmonary Rehab from 04/16/2017 in [facility] Cardiac and Pulmonary Rehab  Date  04/14/17  Educator  CR  Instruction Review Code  1- Verbalizes Understanding      Controlling Sodium/Reading Food Labels: -Group verbal and written material supporting the discussion of sodium use in heart healthy nutrition. Review and explanation with models, verbal and  written materials for utilization of the food label.   Pulmonary Rehab from 04/16/2017 in [facility] Cardiac and Pulmonary Rehab  Date  02/24/17  Educator  CR  Instruction Review Code  1- Verbalizes Understanding      Exercise Physiology &  Risk Factors: - Group verbal and written instruction with models to review the exercise physiology of the cardiovascular system and associated critical values. Details cardiovascular disease risk factors and the goals associated with each risk factor.   Pulmonary Rehab from 04/16/2017 in [facility] Cardiac and Pulmonary Rehab  Date  04/04/17  Educator  [facility]  Instruction Review Code  1- Verbalizes Understanding      Aerobic Exercise & Resistance Training: - Gives group verbal and written discussion on the health impact of inactivity. On the components of aerobic and resistive training programs and the benefits of this training and how to safely progress through these programs.   Flexibility, Balance, General Exercise Guidelines: - Provides group verbal and written instruction on the benefits of flexibility and balance training programs. Provides general exercise guidelines with specific guidelines to those with heart or lung disease. Demonstration and skill practice provided.   Stress Management: - Provides group verbal and written instruction about the health risks of elevated stress, cause of high stress, and healthy ways to reduce stress.   Pulmonary Rehab from 04/16/2017 in [facility] Cardiac and Pulmonary Rehab  Date  03/19/17  Educator  [facility]  Instruction Review Code  1- Verbalizes Understanding      Depression: - Provides group verbal and written instruction on the correlation between heart/lung disease and depressed mood, treatment options, and the stigmas associated with seeking treatment.   Pulmonary Rehab from 04/16/2017 in [facility] Cardiac and Pulmonary Rehab  Date  02/19/17  Educator  [facility]  Instruction Review Code (retired)  2- meets goals/outcomes   Instruction Review Code  1- Science writer Understanding      Exercise & Equipment Safety: - Individual verbal instruction and demonstration of equipment use and safety with use of the equipment.   Pulmonary Rehab from 04/16/2017 in [facility] Cardiac and Pulmonary Rehab  Date  02/11/17  Educator  [facility]  Instruction Review Code  1- Verbalizes Understanding      Infection Prevention: - Provides verbal and written material to individual with discussion of infection control including proper hand washing and proper equipment cleaning during exercise session.   Pulmonary Rehab from 04/16/2017 in [facility] Cardiac and Pulmonary Rehab  Date  02/11/17  Educator  [facility]  Instruction Review Code  2- Demonstrated Understanding      Falls Prevention: - Provides verbal and written material to individual with discussion of falls prevention and safety.   Pulmonary Rehab from 04/16/2017 in [facility] Cardiac and Pulmonary Rehab  Date  02/11/17  Educator  [facility]  Instruction Review Code (retired)  2- meets goals/outcomes  Instruction Review Code  1- Science writer Understanding      Diabetes: - Individual verbal and written instruction to review signs/symptoms of diabetes, desired ranges of glucose level fasting, after meals and with exercise. Advice that pre and post exercise glucose checks will be done for 3 sessions at entry of program.   Chronic Lung Diseases: - Group verbal and written instruction to review new updates, new respiratory medications, new advancements in procedures and treatments. Provide informative websites and "800" numbers of self-education.   Lung Procedures: - Group verbal and written instruction to describe testing methods done to diagnose lung disease. Review the outcome of test results. Describe the treatment choices: Pulmonary Function Tests, ABGs and oximetry.   Energy Conservation: - Provide group verbal and written instruction for methods to conserve energy, plan and organize activities.  Instruct on pacing techniques, use of adaptive equipment and posture/positioning to relieve shortness of breath.   Pulmonary  Rehab from 04/16/2017 in [facility] Cardiac and Pulmonary Rehab  Date  03/12/17  Educator  [facility]  Instruction Review Code  1- Verbalizes Understanding      Triggers: - Group verbal and written instruction to review types of environmental controls: home humidity, furnaces, filters, dust mite/pet prevention, HEPA vacuums. To discuss weather changes, air quality and the benefits of nasal washing.   Exacerbations: - Group verbal and written instruction to provide: warning signs, infection symptoms, calling MD promptly, preventive modes, and value of vaccinations. Review: effective airway clearance, coughing and/or vibration techniques. Create an Sports administrator.   Oxygen: - Individual and group verbal and written instruction on oxygen therapy. Includes supplement oxygen, available portable oxygen systems, continuous and intermittent flow rates, oxygen safety, concentrators, and Medicare reimbursement for oxygen.   Respiratory Medications: - Group verbal and written instruction to review medications for lung disease. Drug class, frequency, complications, importance of spacers, rinsing mouth after steroid MDI's, and proper cleaning methods for nebulizers.   AED/CPR: - Group verbal and written instruction with the use of models to demonstrate the basic use of the AED with the basic ABC's of resuscitation.   Pulmonary Rehab from 04/16/2017 in [facility] Cardiac and Pulmonary Rehab  Date  03/21/17  Educator  CE  Instruction Review Code  1- Verbalizes Understanding      Breathing Retraining: - Provides individuals verbal and written instruction on purpose, frequency, and proper technique of diaphragmatic breathing and pursed-lipped breathing. Applies individual practice skills.   Anatomy and Physiology of the Lungs: - Group verbal and written instruction with the use of models to  provide basic lung anatomy and physiology related to function, structure and complications of lung disease.   Anatomy & Physiology of the Heart: - Group verbal and written instruction and models provide basic cardiac anatomy and physiology, with the coronary electrical and arterial systems. Review of: AMI, Angina, Valve disease, Heart Failure, Cardiac Arrhythmia, Pacemakers, and the ICD.   Heart Failure: - Group verbal and written instruction on the basics of heart failure: signs/symptoms, treatments, explanation of ejection fraction, enlarged heart and cardiomyopathy.   Sleep Apnea: - Individual verbal and written instruction to review Obstructive Sleep Apnea. Review of risk factors, methods for diagnosing and types of masks and machines for OSA.   Anxiety: - Provides group, verbal and written instruction on the correlation between heart/lung disease and anxiety, treatment options, and management of anxiety.   Pulmonary Rehab from 04/16/2017 in [facility] Cardiac and Pulmonary Rehab  Date  03/19/17  Educator  [facility]  Instruction Review Code  1- Verbalizes Understanding      Relaxation: - Provides group, verbal and written instruction about the benefits of relaxation for patients with heart/lung disease. Also provides patients with examples of relaxation techniques.   Pulmonary Rehab from 04/16/2017 in [facility] Cardiac and Pulmonary Rehab  Date  04/16/17  Educator  [facility]  Instruction Review Code  1- Verbalizes Understanding      Cardiac Medications: - Group verbal and written instruction to review commonly prescribed medications for heart disease. Reviews the medication, class of the drug, and side effects.   Pulmonary Rehab from 04/16/2017 in [facility] Cardiac and Pulmonary Rehab  Date  03/07/17  Educator  CE  Instruction Review Code  1- Verbalizes Understanding      Know Your Numbers: -Group verbal and written instruction about important numbers in your health.  Review of Cholesterol, Blood  Pressure, Diabetes, and BMI and the role they play in your overall health.   Pulmonary  Rehab from 04/16/2017 in [facility] Cardiac and Pulmonary Rehab  Date  04/11/17  Educator  [facility]  Instruction Review Code  1- Verbalizes Understanding      Other: -Provides group and verbal instruction on various topics (see comments)    Knowledge Questionnaire Score:     Knowledge Questionnaire Score - 02/11/17 1448      Knowledge Questionnaire Score   Pre Score 7/10       Core Components/Risk Factors/Patient Goals at Admission:     Personal Goals and Risk Factors at Admission - 02/11/17 1515      Core Components/Risk Factors/Patient Goals on Admission    Weight Management Yes;Weight Loss   Intervention Weight Management: Develop a combined nutrition and exercise program designed to reach desired caloric intake, while maintaining appropriate intake of nutrient and fiber, sodium and fats, and appropriate energy expenditure required for the weight goal.;Weight Management: Provide education and appropriate resources to help participant work on and attain dietary goals.;Weight Management/Obesity: Establish reasonable short term and long term weight goals.   Admit Weight 202 lb 4.8 oz (91.8 kg)   Goal Weight: Short Term 197 lb (89.4 kg)   Goal Weight: Long Term 192 lb (87.1 kg)   Expected Outcomes Short Term: Continue to assess and modify interventions until short term weight is achieved;Long Term: Adherence to nutrition and physical activity/exercise program aimed toward attainment of established weight goal;Weight Loss: Understanding of general recommendations for a balanced deficit meal plan, which promotes 1-2 lb weight loss per week and includes a negative energy balance of [phone] kcal/d;Understanding recommendations for meals to include 15-35% energy as protein, 25-35% energy from fat, 35-60% energy from carbohydrates, less than 276m of dietary cholesterol, 20-35 gm of total fiber daily;Understanding  of distribution of calorie intake throughout the day with the consumption of 4-5 meals/snacks   Hypertension Yes   Intervention Provide education on lifestyle modifcations including regular physical activity/exercise, weight management, moderate sodium restriction and increased consumption of fresh fruit, vegetables, and low fat dairy, alcohol moderation, and smoking cessation.;Monitor prescription use compliance.   Expected Outcomes Short Term: Continued assessment and intervention until BP is < 140/972mHG in hypertensive participants. < 130/8055mG in hypertensive participants with diabetes, heart failure or chronic kidney disease.;Long Term: Maintenance of blood pressure at goal levels.   Lipids Yes   Intervention Provide education and support for participant on nutrition & aerobic/resistive exercise along with prescribed medications to achieve LDL <21m67mDL >40mg32mExpected Outcomes Short Term: Participant states understanding of desired cholesterol values and is compliant with medications prescribed. Participant is following exercise prescription and nutrition guidelines.;Long Term: Cholesterol controlled with medications as prescribed, with individualized exercise RX and with personalized nutrition plan. Value goals: LDL < 21mg,27m > 40 mg.      Core Components/Risk Factors/Patient Goals Review:      Goals and Risk Factor Review    Row Name 03/07/17 1250 04/04/17 1220           Core Components/Risk Factors/Patient Goals Review   Personal Goals Review Weight Management/Obesity;Hypertension;Lipids;Improve shortness of breath with ADL's Weight Management/Obesity;Hypertension;Stress;Lipids      Review Darin Lopez haYvone Neueen doing well in rehab.  His weight has been steady since starting the program.  Darin Lopez's blood pressures have been good and he continues to monitor them at home. He has not had any problems with his medications and lipids seem to be doing well.   Darin Lopez's biggest problems continues to be  his shortness of breath and being  able to do stuff around the house.   Darin Lopez has not had his lipids checked recently. His weight has not changed much since  the start of the program. His blood pressure has been under control while attending class and he has been checking his at home. He states that his reading are comparable to ours. Stress levels have been the same.      Expected Outcomes Short: Continue to work on improving his SOB.  Long: Continue to work on risk factor modification Short: Work on decreasing stres levels by exercising. Long: Maintain exercise to maintain a stress free environment.         Core Components/Risk Factors/Patient Goals at Discharge (Final Review):      Goals and Risk Factor Review - 04/04/17 1220      Core Components/Risk Factors/Patient Goals Review   Personal Goals Review Weight Management/Obesity;Hypertension;Stress;Lipids   Review Darin Lopez has not had his lipids checked recently. His weight has not changed much since  the start of the program. His blood pressure has been under control while attending class and he has been checking his at home. He states that his reading are comparable to ours. Stress levels have been the same.   Expected Outcomes Short: Work on decreasing stres levels by exercising. Long: Maintain exercise to maintain a stress free environment.      ITP Comments:     ITP Comments    Row Name 02/11/17 1651 02/21/17 1152 02/24/17 0841 02/26/17 1525 02/28/17 1233   ITP Comments Medical Evaluation Completed. Chart sent for signature and review to Dr. Emily Filbert Director of [place]. Visit diagnosis can be found in [facility] encounter 02/11/17 Patient came in today with spo2 in the low 80's. Informed patient that he may need 4 liters continuous instead of pulsed oxygen. Worked with patient on PLB techniques. He recovers when he rest but requires 6 liters when exercising. 30 day review completed. ITP sent to Dr. Emily Filbert Director of [place]. Continue with  ITP unless changes are made by physician.   Darin Lopez came in today at 83% on his home tank after walking in from the parking lot.  He refuses to use valet or to be dropped off at the curb by his wife.  He needs a portable tank or concentrator that can handle 6L continuous flow like he is on in rehab.  We talked about the importance of discussing with his doctor tomorrow at his pulmonolgy appointment.  He and his wife voiced understanding and will bring it up at their appointment.  Darin Lopez went to [place] yesterday for pulmonary appointment.  He was approved to go on continuous flow for exercise at home.  He was told he will need a few more test before they get the right equipment for him.  We will wait until he has portable continuous flow before initiating home exercise.    [place] Name 03/24/17 0839 03/26/17 1050 04/21/17 0836       ITP Comments 30 day review completed. ITP sent to Dr. Emily Filbert Director of [place]. Continue with ITP unless changes are made by physician.   Kens met with home health Nurse for changes to his exercise oxygen prescription. 1 liter on his CPAP, 2 Liters at rest, 6 Liters for Exercise. 30 day review completed. ITP sent to Dr. Emily Filbert Director of [place]. Continue with ITP unless changes are made by physician.          Comments: 30 day review

## 2017-04-21 NOTE — Progress Notes (Signed)
Daily Session Note  Patient Details  Name: DYKE WEIBLE MRN: [id number] Date of Birth: 25-Oct-1940 Referring Provider:     Pulmonary Rehab from 02/11/2017 in [place] and Pulmonary Rehab  Referring Provider  [facility]      Encounter Date: 04/21/2017  Check In:     Session Check In - 04/21/17 1139      Check-In   Location [facility]   Staff Present Nada Maclachlan, BA, ACSM CEP, Exercise Physiologist;Kelly Amedeo Plenty, BS, ACSM CEP, Exercise Physiologist;Joseph Flavia Shipper   Supervising physician immediately available to respond to emergencies [facility] immediately available ER MD   Physician(s) Dr. Kerman Passey and Mariea Clonts   Medication changes reported     No   Fall or balance concerns reported    No   Warm-up and Cool-down Performed as group-led instruction   Resistance Training Performed Yes   VAD Patient? No     Pain Assessment   Currently in Pain? No/denies   Multiple Pain Sites No         History  Smoking Status  . Former Smoker  . Packs/day: 3.00  . Years: 30.00  . Types: Cigarettes  Smokeless Tobacco  . Never Used    Comment: Quit over 35 yrs ago    Goals Met:  Independence with exercise equipment Exercise tolerated well No report of cardiac concerns or symptoms Strength training completed today  Goals Unmet:  Not Applicable  Comments: Pt able to follow exercise prescription today without complaint.  Will continue to monitor for progression.   Dr. Emily Filbert is Medical Director for [place] and [facility].

## 2017-04-23 DIAGNOSIS — J449 Chronic obstructive pulmonary disease, unspecified: Secondary | ICD-10-CM

## 2017-04-23 NOTE — Progress Notes (Signed)
Daily Session Note  Patient Details  Name: Darin Lopez MRN: [id number] Date of Birth: 02/04/1941 Referring Provider:     Pulmonary Rehab from 02/11/2017 in [facility]  Referring Provider  [facility]      Encounter Date: 04/23/2017  Check In:     Session Check In - 04/23/17 1134      Check-In   Location [facility]   Staff Present Jessica Hawkins, MA, ACSM RCEP, Exercise Physiologist;Meredith Craven, RN BSN;  RCP,RRT,BSRT   Supervising physician immediately available to respond to emergencies [facility] immediately available ER MD   Physician(s) Dr. Williams and [place]   Medication changes reported     No   Fall or balance concerns reported    No   Warm-up and Cool-down Performed as group-led instruction   Resistance Training Performed Yes   VAD Patient? No     Pain Assessment   Currently in Pain? No/denies         History  Smoking Status  . Former Smoker  . Packs/day: 3.00  . Years: 30.00  . Types: Cigarettes  Smokeless Tobacco  . Never Used    Comment: Quit over 35 yrs ago    Goals Met:  Proper associated with RPD/PD & O2 Sat Independence with exercise equipment Using PLB without cueing & demonstrates good technique Exercise tolerated well Strength training completed today  Goals Unmet:  Not Applicable  Comments: Pt able to follow exercise prescription today without complaint.  Will continue to monitor for progression.    Dr. Mark Miller is Medical Director for [facility] and [facility]. 

## 2017-04-25 DIAGNOSIS — J449 Chronic obstructive pulmonary disease, unspecified: Secondary | ICD-10-CM

## 2017-04-25 NOTE — Progress Notes (Signed)
Daily Session Note  Patient Details  Name: Darin Lopez MRN: [id number] Date of Birth: Apr 11, 1941 Referring Provider:     Pulmonary Rehab from 02/11/2017 in [place] and Pulmonary Rehab  Referring Provider  [facility]      Encounter Date: 04/25/2017  Check In:     Session Check In - 04/25/17 1145      Check-In   Location [facility]   Staff Present Justin Mend RCP,RRT,BSRT;[name], RN BSN;Jessica Luan Pulling, MA, ACSM RCEP, Exercise Physiologist   Supervising physician immediately available to respond to emergencies [facility] immediately available ER MD   Physician(s) Dr. Alfred Levins and [place]   Medication changes reported     No   Fall or balance concerns reported    No   Warm-up and Cool-down Performed as group-led instruction   Resistance Training Performed Yes   VAD Patient? No     Pain Assessment   Currently in Pain? No/denies         History  Smoking Status  . Former Smoker  . Packs/day: 3.00  . Years: 30.00  . Types: Cigarettes  Smokeless Tobacco  . Never Used    Comment: Quit over 35 yrs ago    Goals Met:  Proper associated with RPD/PD & O2 Sat Independence with exercise equipment Exercise tolerated well No report of cardiac concerns or symptoms Strength training completed today  Goals Unmet:  Not Applicable  Comments: Pt able to follow exercise prescription today without complaint.  Will continue to monitor for progression.   Dr. Emily Filbert is Medical Director for [place] and [facility].

## 2017-04-28 DIAGNOSIS — Z6826 Body mass index (BMI) 26.0-26.9, adult: Secondary | ICD-10-CM | POA: Diagnosis not present

## 2017-04-28 DIAGNOSIS — R05 Cough: Secondary | ICD-10-CM | POA: Diagnosis not present

## 2017-04-30 ENCOUNTER — Encounter: Payer: Non-veteran care | Admitting: *Deleted

## 2017-04-30 DIAGNOSIS — J449 Chronic obstructive pulmonary disease, unspecified: Secondary | ICD-10-CM

## 2017-04-30 NOTE — Progress Notes (Signed)
Daily Session Note  Patient Details  Name: Darin Lopez MRN: [id number] Date of Birth: Aug 22, 1940 Referring Provider:     Pulmonary Rehab from 02/11/2017 in [place] and Pulmonary Rehab  Referring Provider  [facility]      Encounter Date: 04/30/2017  Check In:     Session Check In - 04/30/17 1141      Check-In   Location [facility]   Staff Present Renita Papa, RN BSN;Joseph Darrin Nipper, [place], ACSM RCEP, Exercise Physiologist   Supervising physician immediately available to respond to emergencies [facility] immediately available ER MD   Physician(s) Dr. Alfred Levins and Reita Cliche   Medication changes reported     No   Fall or balance concerns reported    No   Warm-up and Cool-down Performed as group-led instruction   Resistance Training Performed Yes   VAD Patient? No     Pain Assessment   Currently in Pain? No/denies         History  Smoking Status  . Former Smoker  . Packs/day: 3.00  . Years: 30.00  . Types: Cigarettes  Smokeless Tobacco  . Never Used    Comment: Quit over 35 yrs ago    Goals Met:  Proper associated with RPD/PD & O2 Sat Independence with exercise equipment Using PLB without cueing & demonstrates good technique Exercise tolerated well Strength training completed today  Goals Unmet:  Not Applicable  Comments: Pt able to follow exercise prescription today without complaint.  Will continue to monitor for progression.    Dr. Emily Filbert is Medical Director for [place] and [facility].

## 2017-05-02 ENCOUNTER — Encounter: Payer: Non-veteran care | Attending: Family Medicine

## 2017-05-02 DIAGNOSIS — G4730 Sleep apnea, unspecified: Secondary | ICD-10-CM | POA: Diagnosis not present

## 2017-05-02 DIAGNOSIS — Z79899 Other long term (current) drug therapy: Secondary | ICD-10-CM | POA: Diagnosis not present

## 2017-05-02 DIAGNOSIS — F329 Major depressive disorder, single episode, unspecified: Secondary | ICD-10-CM | POA: Diagnosis not present

## 2017-05-02 DIAGNOSIS — Z9981 Dependence on supplemental oxygen: Secondary | ICD-10-CM | POA: Diagnosis not present

## 2017-05-02 DIAGNOSIS — J449 Chronic obstructive pulmonary disease, unspecified: Secondary | ICD-10-CM | POA: Diagnosis present

## 2017-05-02 DIAGNOSIS — K219 Gastro-esophageal reflux disease without esophagitis: Secondary | ICD-10-CM | POA: Insufficient documentation

## 2017-05-02 DIAGNOSIS — Z7951 Long term (current) use of inhaled steroids: Secondary | ICD-10-CM | POA: Insufficient documentation

## 2017-05-02 DIAGNOSIS — Z87891 Personal history of nicotine dependence: Secondary | ICD-10-CM | POA: Diagnosis not present

## 2017-05-02 NOTE — Progress Notes (Signed)
Daily Session Note  Patient Details  Name: Darin Lopez MRN: [id number] Date of Birth: 01/10/1941 Referring Provider:     Pulmonary Rehab from 02/11/2017 in [place] and Pulmonary Rehab  Referring Provider  [facility]      Encounter Date: 05/02/2017  Check In:     Session Check In - 05/02/17 1058      Check-In   Location [facility]   Staff Present Alberteen Sam, MA, ACSM RCEP, Exercise Physiologist;Joseph Flavia Shipper   Supervising physician immediately available to respond to emergencies [facility] immediately available ER MD   Physician(s) Dr. Burlene Arnt and Clearnce Hasten   Medication changes reported     No   Fall or balance concerns reported    No   Warm-up and Cool-down Performed as group-led instruction   Resistance Training Performed Yes   VAD Patient? No     Pain Assessment   Currently in Pain? No/denies         History  Smoking Status  . Former Smoker  . Packs/day: 3.00  . Years: 30.00  . Types: Cigarettes  Smokeless Tobacco  . Never Used    Comment: Quit over 35 yrs ago    Goals Met:  Exercise tolerated well No report of cardiac concerns or symptoms Strength training completed today  Goals Unmet:  Not Applicable  Comments: Pt able to follow exercise prescription today without complaint.  Will continue to monitor for progression.   Dr. Emily Filbert is Medical Director for [place] and [facility].

## 2017-05-05 DIAGNOSIS — J449 Chronic obstructive pulmonary disease, unspecified: Secondary | ICD-10-CM | POA: Diagnosis not present

## 2017-05-05 NOTE — Progress Notes (Signed)
Daily Session Note  Patient Details  Name: Darin Lopez MRN: [id number] Date of Birth: 05-11-1941 Referring Provider:     Pulmonary Rehab from 02/11/2017 in [place] and Pulmonary Rehab  Referring Provider  [facility]      Encounter Date: 05/05/2017  Check In: Session Check In - 05/05/17 1152      Check-In   Location  [facility]    Staff Present  Earlean Shawl, BS, ACSM CEP, Exercise Physiologist;Amanda Oletta Darter, BA, ACSM CEP, Exercise Physiologist;Joseph Flavia Shipper    Supervising physician immediately available to respond to emergencies  [facility] immediately available ER MD    Physician(s)  Dr. Jimmye Norman and Alfred Levins    Medication changes reported      No    Fall or balance concerns reported     No    Warm-up and Cool-down  Performed as group-led instruction    Resistance Training Performed  Yes    VAD Patient?  No      Pain Assessment   Currently in Pain?  No/denies          Social History   Tobacco Use  Smoking Status Former Smoker  . Packs/day: 3.00  . Years: 30.00  . Pack years: 90.00  . Types: Cigarettes  Smokeless Tobacco Never Used  Tobacco Comment   Quit over 35 yrs ago    Goals Met:  Independence with exercise equipment Exercise tolerated well No report of cardiac concerns or symptoms Strength training completed today  Goals Unmet:  Not Applicable  Comments: Pt able to follow exercise prescription today without complaint.  Will continue to monitor for progression.   Dr. Emily Filbert is Medical Director for [place] and [facility].

## 2017-05-07 ENCOUNTER — Encounter: Payer: Non-veteran care | Admitting: *Deleted

## 2017-05-07 DIAGNOSIS — J449 Chronic obstructive pulmonary disease, unspecified: Secondary | ICD-10-CM | POA: Diagnosis not present

## 2017-05-07 NOTE — Progress Notes (Signed)
Daily Session Note  Patient Details  Name: Darin Lopez MRN: [id number] Date of Birth: 07/13/1940 Referring Provider:     Pulmonary Rehab from 02/11/2017 in [place] and Pulmonary Rehab  Referring Provider  [facility]      Encounter Date: 05/07/2017  Check In: Session Check In - 05/07/17 1125      Check-In   Staff Present  Renita Papa, RN BSN;Joseph Flavia Shipper    Supervising physician immediately available to respond to emergencies  [facility] immediately available ER MD    Physician(s)  Dr. Jimmye Norman and [facility]    Medication changes reported      No    Fall or balance concerns reported     No    Warm-up and Cool-down  Performed as group-led instruction    Resistance Training Performed  Yes    VAD Patient?  No      Pain Assessment   Currently in Pain?  No/denies        Exercise Prescription Changes - 05/06/17 1500      Response to Exercise   Blood Pressure (Admit)  132/70    Blood Pressure (Exit)  118/70    Heart Rate (Admit)  102 bpm    Heart Rate (Exercise)  113 bpm    Heart Rate (Exit)  92 bpm    Oxygen Saturation (Admit)  89 %    Oxygen Saturation (Exercise)  88 %    Oxygen Saturation (Exit)  93 %    Rating of Perceived Exertion (Exercise)  15    Perceived Dyspnea (Exercise)  3    Symptoms  SON    Comments  SOB on treadmill    Duration  Continue with 45 min of aerobic exercise without signs/symptoms of physical distress.    Intensity  THRR unchanged      Progression   Progression  Continue to progress workloads to maintain intensity without signs/symptoms of physical distress.    Average METs  3.04      Resistance Training   Training Prescription  Yes    Weight  5 lbs    Reps  10-15      Interval Training   Interval Training  No      Oxygen   Oxygen  Continuous    Liters  6 pendant   pendant     Treadmill   MPH  2    Grade  0    Minutes  15    METs  2.53      Arm Ergometer   Level  1    RPM  47    Minutes  15    METs   2.5      REL-XR   Level  1    Minutes  15    METs  4.1      Home Exercise Plan   Plans to continue exercise at  Home (comment) walking   walking   Frequency  Add 1 additional day to program exercise sessions.    Initial Home Exercises Provided  03/07/17       Social History   Tobacco Use  Smoking Status Former Smoker  . Packs/day: 3.00  . Years: 30.00  . Pack years: 90.00  . Types: Cigarettes  Smokeless Tobacco Never Used  Tobacco Comment   Quit over 35 yrs ago    Goals Met:  Proper associated with RPD/PD & O2 Sat Independence with exercise equipment Using PLB without cueing & demonstrates good technique Exercise tolerated  well Strength training completed today  Goals Unmet:  Not Applicable  Comments: Pt able to follow exercise prescription today without complaint.  Will continue to monitor for progression.    Dr. Emily Filbert is Medical Director for [place] and [facility].

## 2017-05-09 DIAGNOSIS — J449 Chronic obstructive pulmonary disease, unspecified: Secondary | ICD-10-CM

## 2017-05-09 NOTE — Progress Notes (Signed)
Daily Session Note  Patient Details  Name: Darin Lopez MRN: [id number] Date of Birth: 23-Jul-1940 Referring Provider:     Pulmonary Rehab from 02/11/2017 in [facility] Re[facility]  Referring Provider  [facility]      Encounter Date: 05/09/2017  Check In: Session Check In - 05/09/17 1133      Check-In   Location  [facility]    Staff Present  Justin Mend RCP,RRT,BSRT;[name], RN BSN;Jessica Luan Pulling, MA, ACSM RCEP, Exercise Physiologist    Supervising physician immediately available to respond to emergencies  See telemetry face sheet for immediately available ER MD    Physician(s)  Dr. Mable Paris and [facility]    Medication changes reported      No    Fall or balance concerns reported     No    Warm-up and Cool-down  Performed as group-led instruction    Resistance Training Performed  Yes    VAD Patient?  No      Pain Assessment   Currently in Pain?  No/denies          Social History   Tobacco Use  Smoking Status Former Smoker  . Packs/day: 3.00  . Years: 30.00  . Pack years: 90.00  . Types: Cigarettes  Smokeless Tobacco Never Used  Tobacco Comment   Quit over 35 yrs ago    Goals Met:  Independence with exercise equipment Exercise tolerated well No report of cardiac concerns or symptoms Strength training completed today  Goals Unmet:  Not Applicable  Comments: Pt able to follow exercise prescription today without complaint.  Will continue to monitor for progression.   Dr. Emily Filbert is Medical Director for [place] and [facility].

## 2017-05-12 DIAGNOSIS — J449 Chronic obstructive pulmonary disease, unspecified: Secondary | ICD-10-CM

## 2017-05-12 NOTE — Progress Notes (Signed)
Daily Session Note  Patient Details  Name: Darin Lopez MRN: [id number] Date of Birth: Sep 22, 1940 Referring Provider:     Pulmonary Rehab from 02/11/2017 in [place] and Pulmonary Rehab  Referring Provider  [facility]      Encounter Date: 05/12/2017  Check In: Session Check In - 05/12/17 1142      Check-In   Staff Present  Nada Maclachlan, BA, ACSM CEP, Exercise Physiologist;Kelly Amedeo Plenty, BS, ACSM CEP, Exercise Physiologist;Niles Ess Flavia Shipper    Supervising physician immediately available to respond to emergencies  [facility] immediately available ER MD    Physician(s)  Dr. Burlene Arnt and Archie Balboa    Medication changes reported      No    Fall or balance concerns reported     No    Warm-up and Cool-down  Performed as group-led instruction    Resistance Training Performed  Yes    VAD Patient?  No      Pain Assessment   Currently in Pain?  No/denies          Social History   Tobacco Use  Smoking Status Former Smoker  . Packs/day: 3.00  . Years: 30.00  . Pack years: 90.00  . Types: Cigarettes  Smokeless Tobacco Never Used  Tobacco Comment   Quit over 35 yrs ago    Goals Met:  Independence with exercise equipment Exercise tolerated well No report of cardiac concerns or symptoms Strength training completed today  Goals Unmet:  Not Applicable  Comments: Pt able to follow exercise prescription today without complaint.  Will continue to monitor for progression.   Dr. Emily Filbert is Medical Director for [place] and [facility].

## 2017-05-14 DIAGNOSIS — J449 Chronic obstructive pulmonary disease, unspecified: Secondary | ICD-10-CM | POA: Diagnosis not present

## 2017-05-14 NOTE — Progress Notes (Signed)
Daily Session Note  Patient Details  Name: Darin Lopez MRN: [id number] Date of Birth: Aug 17, 1940 Referring Provider:     Pulmonary Rehab from 02/11/2017 in [place] and Pulmonary Rehab  Referring Provider  [facility]      Encounter Date: 05/14/2017  Check In: Session Check In - 05/14/17 1128      Check-In   Location  [facility]    Staff Present  Renita Papa, RN BSN;Ayron Fillinger Darrin Nipper, [place], ACSM RCEP, Exercise Physiologist    Supervising physician immediately available to respond to emergencies  [facility] immediately available ER MD    Physician(s)  Dr. Jimmye Norman and [place]    Medication changes reported      No    Fall or balance concerns reported     No    Warm-up and Cool-down  Performed as group-led instruction    Resistance Training Performed  Yes    VAD Patient?  No      Pain Assessment   Currently in Pain?  No/denies          Social History   Tobacco Use  Smoking Status Former Smoker  . Packs/day: 3.00  . Years: 30.00  . Pack years: 90.00  . Types: Cigarettes  Smokeless Tobacco Never Used  Tobacco Comment   Quit over 35 yrs ago    Goals Met:  Independence with exercise equipment Exercise tolerated well No report of cardiac concerns or symptoms Strength training completed today  Goals Unmet:  Not Applicable  Comments: Pt able to follow exercise prescription today without complaint.  Will continue to monitor for progression.  [place] Name 02/11/17 1612 05/14/17 1235       6 Minute Walk   Phase  Initial  Discharge    Distance  722 feet  1200 feet    Distance % Change  -  66.2 %    Distance Feet Change  -  478 ft    Walk Time  3.58 minutes  6 minutes    # of Rest Breaks  0 test stopped at 3:35 due to desaturation  0    MPH  2.29  2.27    METS  1.98  2.83    RPE  15  16    Perceived Dyspnea   3.5  3.5    VO2 Peak  6.93  9.91    Symptoms  Yes (comment)  Yes (comment)    Comments  SOB  SOB    Resting HR  95 bpm  91 bpm    Resting BP  126/66  134/72    Resting Oxygen Saturation   -  94 %    Exercise Oxygen Saturation  during 6 min walk  -  84 %    Max Ex. HR  120 bpm  123 bpm    Max Ex. BP  132/74  136/80    2 Minute Post BP  126/56  136/80      Interval HR   Baseline HR (retired)  95  -    1 Minute HR  120  102    2 Minute HR  121  110    3 Minute HR  125  118    4 Minute HR  115 test stopped at 3:35 HR was 110  117    5 Minute HR  94  123    6 Minute HR  58  120    2 Minute Post  HR  -  91    Interval Heart Rate?  Yes  Yes      Interval Oxygen   Interval Oxygen?  Yes  Yes    Baseline Oxygen Saturation %  92 %  94 %    Resting Liters of Oxygen  4 L pulsed  -    1 Minute Oxygen Saturation %  87 %  92 %    1 Minute Liters of Oxygen  4 L  5 L pulsed    2 Minute Oxygen Saturation %  83 %  87 %    2 Minute Liters of Oxygen  4 L  5 L    3 Minute Oxygen Saturation %  79 %  85 %    3 Minute Liters of Oxygen  4 L  5 L    4 Minute Oxygen Saturation %  80 % test stopped at 3:35 SaO2 was 78%  84 %    4 Minute Liters of Oxygen  4 L  5 L    5 Minute Oxygen Saturation %  90 %  85 %    5 Minute Liters of Oxygen  4 L  5 L    6 Minute Oxygen Saturation %  95 %  86 %    6 Minute Liters of Oxygen  4 L  5 L    2 Minute Post Oxygen Saturation %  -  91 %    2 Minute Post Liters of Oxygen  -  5 L        Dr. Emily Filbert is Medical Director for [place] and [facility].

## 2017-05-15 DIAGNOSIS — J449 Chronic obstructive pulmonary disease, unspecified: Secondary | ICD-10-CM | POA: Diagnosis not present

## 2017-05-15 DIAGNOSIS — K51019 Ulcerative (chronic) pancolitis with unspecified complications: Secondary | ICD-10-CM | POA: Diagnosis not present

## 2017-05-15 DIAGNOSIS — Z7951 Long term (current) use of inhaled steroids: Secondary | ICD-10-CM | POA: Diagnosis not present

## 2017-05-15 DIAGNOSIS — Z79899 Other long term (current) drug therapy: Secondary | ICD-10-CM | POA: Diagnosis not present

## 2017-05-15 DIAGNOSIS — Z9981 Dependence on supplemental oxygen: Secondary | ICD-10-CM | POA: Diagnosis not present

## 2017-05-15 DIAGNOSIS — Z7952 Long term (current) use of systemic steroids: Secondary | ICD-10-CM | POA: Diagnosis not present

## 2017-05-16 DIAGNOSIS — J449 Chronic obstructive pulmonary disease, unspecified: Secondary | ICD-10-CM | POA: Diagnosis not present

## 2017-05-16 NOTE — Progress Notes (Signed)
Daily Session Note  Patient Details  Name: Darin Lopez MRN: [id number] Date of Birth: 1940-08-04 Referring Provider:     Pulmonary Rehab from 02/11/2017 in [place] and Pulmonary Rehab  Referring Provider  [facility]      Encounter Date: 05/16/2017  Check In: Session Check In - 05/16/17 1132      Check-In   Location  [facility]    Staff Present  Renita Papa, RN BSN;Joseph Darrin Nipper, [place], ACSM RCEP, Exercise Physiologist    Supervising physician immediately available to respond to emergencies  [facility] immediately available ER MD    Physician(s)  Dr. Corky Downs and Burlene Arnt    Medication changes reported      No    Fall or balance concerns reported     No    Warm-up and Cool-down  Performed as group-led instruction    Resistance Training Performed  Yes    VAD Patient?  No      Pain Assessment   Currently in Pain?  No/denies          Social History   Tobacco Use  Smoking Status Former Smoker  . Packs/day: 3.00  . Years: 30.00  . Pack years: 90.00  . Types: Cigarettes  Smokeless Tobacco Never Used  Tobacco Comment   Quit over 35 yrs ago    Goals Met:  Proper associated with RPD/PD & O2 Sat Independence with exercise equipment Using PLB without cueing & demonstrates good technique Exercise tolerated well No report of cardiac concerns or symptoms Strength training completed today  Goals Unmet:  Not Applicable  Comments: Pt able to follow exercise prescription today without complaint.  Will continue to monitor for progression.    Dr. Emily Filbert is Medical Director for [place] and [facility].

## 2017-05-19 DIAGNOSIS — J449 Chronic obstructive pulmonary disease, unspecified: Secondary | ICD-10-CM

## 2017-05-19 NOTE — Progress Notes (Signed)
Pulmonary Individual Treatment Plan  Patient Details  Name: Darin Lopez MRN: [id number] Date of Birth: [birth year] Referring Provider:     Pulmonary Rehab from 02/11/2017 in [facility] Cardiac and Pulmonary Rehab  Referring Provider  [facility]      Initial Encounter Date:    Pulmonary Rehab from 02/11/2017 in [facility] Cardiac and Pulmonary Rehab  Date  02/11/17  Referring Provider  [facility]      Visit Diagnosis: Chronic obstructive pulmonary disease, unspecified COPD type ([place])  Patient's Home Medications on Admission:  Current Outpatient Medications:  .  acidophilus (RISAQUAD) CAPS capsule, Take 1 capsule by mouth daily., Disp: , Rfl:  .  albuterol (PROVENTIL HFA;VENTOLIN HFA) 108 (90 Base) MCG/ACT inhaler, Inhale 2 puffs into the lungs every 6 (six) hours as needed for wheezing or shortness of breath., Disp: 1 Inhaler, Rfl: 2 .  benzonatate (TESSALON PERLES) 100 MG capsule, Take 1 capsule (100 mg total) by mouth 3 (three) times daily as needed for cough., Disp: 20 capsule, Rfl: 0 .  Budesonide (UCERIS) 9 MG TB24, Take 9 mg by mouth daily., Disp: , Rfl:  .  budesonide-formoterol (SYMBICORT) 160-4.5 MCG/ACT inhaler, Inhale 2 puffs into the lungs 2 (two) times daily., Disp: , Rfl:  .  cephALEXin (KEFLEX) 500 MG capsule, Take 1 capsule (500 mg total) by mouth every 12 (twelve) hours., Disp: 10 capsule, Rfl: 0 .  chlorpheniramine-HYDROcodone (TUSSIONEX) 10-8 MG/5ML SUER, Take 5 mLs by mouth every 12 (twelve) hours as needed for cough., Disp: 115 mL, Rfl: 0 .  Coenzyme Q10 (CO Q 10) 60 MG CAPS, Take 60 mg by mouth daily., Disp: , Rfl:  .  diphenhydrAMINE (BENADRYL) 25 MG tablet, Take 25 mg by mouth at bedtime as needed for allergies or sleep. , Disp: , Rfl:  .  gabapentin (NEURONTIN) 300 MG capsule, Take 300 mg by mouth at bedtime. , Disp: , Rfl:  .  mercaptopurine (PURINETHOL) 50 MG tablet, Take 100 mg by mouth daily., Disp: , Rfl:  .  mesalamine (PENTASA) 250 MG CR capsule, Take 1,000 mg by  mouth daily. , Disp: , Rfl:  .  Multiple Vitamin (MULTIVITAMIN WITH MINERALS) TABS tablet, Take 1 tablet by mouth daily., Disp: , Rfl:  .  nitroGLYCERIN (NITROSTAT) 0.4 MG SL tablet, Place 0.4 mg under the tongue every 5 (five) minutes as needed for chest pain., Disp: , Rfl:  .  omeprazole (PRILOSEC) 20 MG capsule, Take 20 mg by mouth daily., Disp: , Rfl:  .  pravastatin (PRAVACHOL) 20 MG tablet, Take 20 mg by mouth at bedtime. , Disp: , Rfl:  .  predniSONE (DELTASONE) 10 MG tablet, Take 10 mg by mouth daily., Disp: , Rfl:  .  predniSONE (DELTASONE) 5 MG tablet, 40 mg po daily for 2 days, 20 mg po daily for 2 days, then continue home dose 10 mg po daily., Disp: 30 tablet, Rfl: 0 .  salmeterol (SEREVENT) 50 MCG/DOSE diskus inhaler, Inhale 1 puff into the lungs daily., Disp: , Rfl:  .  sertraline (ZOLOFT) 50 MG tablet, Take 100 mg by mouth daily. , Disp: , Rfl:  .  terazosin (HYTRIN) 10 MG capsule, Take 20 mg by mouth at bedtime. , Disp: , Rfl:  .  tiotropium (SPIRIVA) 18 MCG inhalation capsule, Place 1 capsule (18 mcg total) into inhaler and inhale daily., Disp: 30 capsule, Rfl: 2 .  zolpidem (AMBIEN) 10 MG tablet, Take 5-10 mg by mouth at bedtime as needed for sleep. , Disp: , Rfl:  Past Medical History: Past Medical History:  Diagnosis Date  . Colitis   . COPD (chronic obstructive pulmonary disease) ([place])   . Depression   . Emphysema   . Emphysema of lung ([place])   . GERD (gastroesophageal reflux disease)   . Heart murmur    "all my life"  . Hypertension    Hx - denies current issues  . On home oxygen therapy    4L - continuous  . Shortness of breath dyspnea   . Sleep apnea    CPAP  . Wears dentures    full upper and lower    Tobacco Use: Social History   Tobacco Use  Smoking Status Former Smoker  . Packs/day: 3.00  . Years: 30.00  . Pack years: 90.00  . Types: Cigarettes  Smokeless Tobacco Never Used  Tobacco Comment   Quit over 35 yrs ago    Labs: Recent Review  Flowsheet Data    There is no flowsheet data to display.       Pulmonary Assessment Scores: Pulmonary Assessment Scores    Row Name 02/11/17 1448 03/26/17 1133 05/12/17 1147     ADL UCSD   ADL Phase  Entry  Mid  Exit   SOB Score total  76  80  30   Rest  _0 Walk  _1 Stairs  _2 Bath  2  2  0   Dress  2  3  0   Shop  _3 CAT Score   CAT Score  30  -  26     mMRC Score   mMRC Score  3  -  -      Pulmonary Function Assessment: Pulmonary Function Assessment - 02/11/17 1507      Initial Spirometry Results   FVC%  59 %    FEV1%  51 %    FEV1/FVC Ratio  63.43      Post Bronchodilator Spirometry Results   FVC%  64.31 %    FEV1%  58.14 %    FEV1/FVC Ratio  66.62      Breath   Bilateral Breath Sounds  Clear;Decreased    Shortness of Breath  Limiting activity;Yes       Exercise Target Goals:    Exercise Program Goal: Individual exercise prescription set with THRR, safety & activity barriers. Participant demonstrates ability to understand and report RPE using BORG scale, to self-measure pulse accurately, and to acknowledge the importance of the exercise prescription.  Exercise Prescription Goal: Starting with aerobic activity 30 plus minutes a day, 3 days per week for initial exercise prescription. Provide home exercise prescription and guidelines that participant acknowledges understanding prior to discharge.  Activity Barriers & Risk Stratification: Activity Barriers & Cardiac Risk Stratification - 02/11/17 1617      Activity Barriers & Cardiac Risk Stratification   Activity Barriers  Deconditioning;Muscular Weakness;Shortness of Breath       6 Minute Walk: 6 Minute Walk    Row Name 02/11/17 1612 05/14/17 1235       6 Minute Walk   Phase  Initial  Discharge    Distance  722 feet  1200 feet    Distance % Change  -  66.2 %    Distance Feet Change  -  478 ft    Walk Time  3.58 minutes  6 minutes    #  of Rest Breaks  0 test  stopped at 3:35 due to desaturation  0    MPH  2.29  2.27    METS  1.98  2.83    RPE  15  16    Perceived Dyspnea   3.5  3.5    VO2 Peak  6.93  9.91    Symptoms  Yes (comment)  Yes (comment)    Comments  SOB  SOB    Resting HR  95 bpm  91 bpm    Resting BP  126/66  134/72    Resting Oxygen Saturation   -  94 %    Exercise Oxygen Saturation  during 6 min walk  -  84 %    Max Ex. HR  120 bpm  123 bpm    Max Ex. BP  132/74  136/80    2 Minute Post BP  126/56  136/80      Interval HR   Baseline HR (retired)  95  -    1 Minute HR  120  102    2 Minute HR  121  110    3 Minute HR  125  118    4 Minute HR  115 test stopped at 3:35 HR was 110  117    5 Minute HR  94  123    6 Minute HR  58  120    2 Minute Post HR  -  91    Interval Heart Rate?  Yes  Yes      Interval Oxygen   Interval Oxygen?  Yes  Yes    Baseline Oxygen Saturation %  92 %  94 %    Resting Liters of Oxygen  4 L pulsed  -    1 Minute Oxygen Saturation %  87 %  92 %    1 Minute Liters of Oxygen  4 L  5 L pulsed    2 Minute Oxygen Saturation %  83 %  87 %    2 Minute Liters of Oxygen  4 L  5 L    3 Minute Oxygen Saturation %  79 %  85 %    3 Minute Liters of Oxygen  4 L  5 L    4 Minute Oxygen Saturation %  80 % test stopped at 3:35 SaO2 was 78%  84 %    4 Minute Liters of Oxygen  4 L  5 L    5 Minute Oxygen Saturation %  90 %  85 %    5 Minute Liters of Oxygen  4 L  5 L    6 Minute Oxygen Saturation %  95 %  86 %    6 Minute Liters of Oxygen  4 L  5 L    2 Minute Post Oxygen Saturation %  -  91 %    2 Minute Post Liters of Oxygen  -  5 L      Oxygen Initial Assessment: Oxygen Initial Assessment - 02/11/17 1458      Home Oxygen   Home Oxygen Device  Liquid Oxygen;Home Concentrator    Sleep Oxygen Prescription  CPAP    Liters per minute  5    Home Exercise Oxygen Prescription  Continuous    Liters per minute  5    Home at Rest Exercise Oxygen Prescription  Continuous    Liters per minute  5     Compliance with Home Oxygen Use  Yes  Initial 6 min Walk   Oxygen Used  Pulsed;Liquid Oxygen    Liters per minute  4    Resting Oxygen Saturation   92 %    Exercise Oxygen Saturation  during 6 min walk  78 %      Program Oxygen Prescription   Program Oxygen Prescription  Continuous;E-Tanks    Liters per minute  6    Comments  Still desaturated during walk on 6L.  We will have the patient start with intermitten exercise, especially on the treadmill.       Intervention   Short Term Goals  To learn and exhibit compliance with exercise, home and travel O2 prescription;To Learn and understand importance of maintaining oxygen saturations>88%;To learn and understand importance of monitoring SPO2 with pulse oximeter and demonstrate accurate use of the pulse oximeter.;To learn and demonstrate proper purse lipped breathing techniques or other breathing techniques.;To learn and demonstrate proper use of respiratory medications    Long  Term Goals  Exhibits compliance with exercise, home and travel O2 prescription;Maintenance of O2 saturations>88%;Compliance with respiratory medication;Demonstrates proper use of MDI's;Exhibits proper breathing techniques, such as purse lipped breathing or other method taught during program session;Verbalizes importance of monitoring SPO2 with pulse oximeter and return demonstration       Oxygen Re-Evaluation: Oxygen Re-Evaluation    Row Name 03/07/17 1418 03/14/17 1025 03/26/17 1221 04/04/17 1224 04/30/17 1446     Program Oxygen Prescription   Program Oxygen Prescription  Continuous;E-Tanks  Continuous;E-Tanks  Continuous;Liquid Oxygen;E-Tanks  Continuous;E-Tanks;Liquid Oxygen  Continuous;E-Tanks   Liters per minute  6  6 10L walking  _0 Comments  -  Once pendant ordered, we will change to 6L for time  -  -  -     Home Oxygen   Home Oxygen Device  Home Concentrator;Liquid Oxygen  Home Concentrator;Liquid Oxygen  Home Concentrator;E-Tanks;Liquid Oxygen   Home Concentrator;Liquid Oxygen;E-Tanks  Home Concentrator;Liquid Oxygen;E-Tanks   Sleep Oxygen Prescription  CPAP  CPAP  CPAP  CPAP  CPAP   Liters per minute  _1 Home Exercise Oxygen Prescription  Pulsed  Continuous  Continuous  Continuous  Continuous   Liters per minute  4  6 pendant  _2 Home at Rest Exercise Oxygen Prescription  Continuous  Continuous  Continuous  Continuous  Continuous   Liters per minute  4  2 pendant  _3 Compliance with Home Oxygen Use  Yes  Yes  Yes  Yes  Yes     Goals/Expected Outcomes   Short Term Goals  To learn and exhibit compliance with exercise, home and travel O2 prescription;To learn and demonstrate proper use of respiratory medications;To learn and understand importance of maintaining oxygen saturations>88%;To learn and demonstrate proper pursed lip breathing techniques or other breathing techniques.;To learn and understand importance of monitoring SPO2 with pulse oximeter and demonstrate accurate use of the pulse oximeter.  To learn and exhibit compliance with exercise, home and travel O2 prescription  To learn and understand importance of maintaining oxygen saturations>88%;To learn and exhibit compliance with exercise, home and travel O2 prescription;To learn and demonstrate proper use of respiratory medications;To learn and demonstrate proper pursed lip breathing techniques or other breathing techniques.;To learn and understand importance of monitoring SPO2 with pulse oximeter and demonstrate accurate use of the pulse oximeter.  To learn and exhibit compliance with exercise, home and travel  O2 prescription;To learn and understand importance of maintaining oxygen saturations>88%;To learn and demonstrate proper use of respiratory medications;To learn and demonstrate proper pursed lip breathing techniques or other breathing techniques.;To learn and understand importance of monitoring SPO2 with pulse oximeter and demonstrate accurate use of  the pulse oximeter.  To learn and demonstrate proper use of respiratory medications;To learn and understand importance of maintaining oxygen saturations>88%;To learn and exhibit compliance with exercise, home and travel O2 prescription;To learn and demonstrate proper pursed lip breathing techniques or other breathing techniques.;To learn and understand importance of monitoring SPO2 with pulse oximeter and demonstrate accurate use of the pulse oximeter.   Long  Term Goals  Exhibits compliance with exercise, home and travel O2 prescription;Maintenance of O2 saturations>88%;Compliance with respiratory medication;Verbalizes importance of monitoring SPO2 with pulse oximeter and return demonstration;Exhibits proper breathing techniques, such as pursed lip breathing or other method taught during program session;Demonstrates proper use of MDI's  Exhibits compliance with exercise, home and travel O2 prescription  Verbalizes importance of monitoring SPO2 with pulse oximeter and return demonstration;Exhibits proper breathing techniques, such as pursed lip breathing or other method taught during program session;Demonstrates proper use of MDI's;Maintenance of O2 saturations>88%;Compliance with respiratory medication;Exhibits compliance with exercise, home and travel O2 prescription  Verbalizes importance of monitoring SPO2 with pulse oximeter and return demonstration;Exhibits proper breathing techniques, such as pursed lip breathing or other method taught during program session;Demonstrates proper use of MDI's;Compliance with respiratory medication;Maintenance of O2 saturations>88%;Exhibits compliance with exercise, home and travel O2 prescription  Verbalizes importance of monitoring SPO2 with pulse oximeter and return demonstration;Exhibits proper breathing techniques, such as pursed lip breathing or other method taught during program session;Demonstrates proper use of MDI's;Compliance with respiratory medication;Exhibits  compliance with exercise, home and travel O2 prescription;Maintenance of O2 saturations>88%   Comments  Darin Lopez has been compliant with his oxygen.  He is working with his doctor to get  a new portable concentrator to be able to use continous flow for exercise.  He does use his pulse oximeter at home to monitor his saturations.  Darin Lopez continues to gain proficiency at using PLB.  He continues to use his inhaler and has recently changed back to a different medication.  Previously, this med had given him a migraine, but so far so good.  He has not been using his spacer, but will look for it over the weekend.    Darin Lopez had his [place] appointment yesterday with pulmonary.  They increased his prescription see notes above.  He was ordered to start using a pendant or to increase liter flow to 10L on standard cannula.  Darin Lopez states he bought a portable oxygen system that holds his liquid tank he currently has. He states his one now is too heavy for him to carry. He wasnts to bring it in to make sure everything works correctly. Informed him that we can check his device but he should ask his home care provider if iit is appropriate.  Nada Boozer is checking checking his oxygen at home regularly. He is taking his medications as prescribed. He states his wife keeps him on track with taking his medications and wearing his oxygen. Nada Boozer has not been using PLB as much since he has not been feeling well. His protable oxygen machine is still giving him issues with it being so heavy  Darin Lopez has been doing very well in the program. He is using a spacer with his inhalers. Darin Lopez takes albuterol four times a day and symbicort. He is checking his oxygen  at home. Darin Lopez has a new portalbe tank that is alot lighter and makes him less short of breath the carry.   Goals/Expected Outcomes  Short: Find spacer to use with inhaler.  Long: Get new Marine scientist.   Short: Get outfitted for new concentrator and pendant, ask for pendant for use in rehab.  Long: Continued  complaince and improved saturations with exertion.   Short: Work on PLB. Long:Be proficient with PLB  Short: Work on Pursed lip breathing techniques. Long: Maintain oxygen staturations independently at home and use PLB   Short: Be compliant with his home oxygen. Long: maintain compliance with home oxygen.      Oxygen Discharge (Final Oxygen Re-Evaluation): Oxygen Re-Evaluation - 04/30/17 1446      Program Oxygen Prescription   Program Oxygen Prescription  Continuous;E-Tanks    Liters per minute  6      Home Oxygen   Home Oxygen Device  Home Concentrator;Liquid Oxygen;E-Tanks    Sleep Oxygen Prescription  CPAP    Liters per minute  4    Home Exercise Oxygen Prescription  Continuous    Liters per minute  6    Home at Rest Exercise Oxygen Prescription  Continuous    Liters per minute  6    Compliance with Home Oxygen Use  Yes      Goals/Expected Outcomes   Short Term Goals  To learn and demonstrate proper use of respiratory medications;To learn and understand importance of maintaining oxygen saturations>88%;To learn and exhibit compliance with exercise, home and travel O2 prescription;To learn and demonstrate proper pursed lip breathing techniques or other breathing techniques.;To learn and understand importance of monitoring SPO2 with pulse oximeter and demonstrate accurate use of the pulse oximeter.    Long  Term Goals  Verbalizes importance of monitoring SPO2 with pulse oximeter and return demonstration;Exhibits proper breathing techniques, such as pursed lip breathing or other method taught during program session;Demonstrates proper use of MDI's;Compliance with respiratory medication;Exhibits compliance with exercise, home and travel O2 prescription;Maintenance of O2 saturations>88%    Comments  Darin Lopez has been doing very well in the program. He is using a spacer with his inhalers. Darin Lopez takes albuterol four times a day and symbicort. He is checking his oxygen at home. Darin Lopez has a new portalbe  tank that is alot lighter and makes him less short of breath the carry.    Goals/Expected Outcomes  Short: Be compliant with his home oxygen. Long: maintain compliance with home oxygen.       Initial Exercise Prescription: Initial Exercise Prescription - 02/11/17 1600      Date of Initial Exercise RX and Referring Provider   Date  02/11/17    Referring Provider  [facility]      Oxygen   Oxygen  Continuous    Liters  6      Treadmill   MPH  2    Grade  0    Minutes  8 2 min intervals x4    METs  2.53      Arm Ergometer   Level  2    Watts  29    RPM  25    Minutes  15    METs  2      REL-XR   Level  1    Speed  50    Minutes  15    METs  2      Prescription Details   Frequency (times per week)  3    Duration  Progress to 45 minutes of aerobic exercise without signs/symptoms of physical distress      Intensity   THRR 40-80% of Max Heartrate  115-135    Ratings of Perceived Exertion  11-13    Perceived Dyspnea  0-4      Progression   Progression  Continue to progress workloads to maintain intensity without signs/symptoms of physical distress.      Resistance Training   Training Prescription  Yes    Weight  4 lbs    Reps  10-15       Perform Capillary Blood Glucose checks as needed.  Exercise Prescription Changes: Exercise Prescription Changes    Row Name 02/11/17 1600 02/26/17 1500 03/07/17 1400 03/11/17 1400 03/25/17 1500     Response to Exercise   Blood Pressure (Admit)  126/66  122/64  -  122/70  110/84   Blood Pressure (Exercise)  132/74  -  -  -  -   Blood Pressure (Exit)  126/56  124/62  -  118/62  100/74   Heart Rate (Admit)  95 bpm  69 bpm  -  85 bpm  74 bpm   Heart Rate (Exercise)  125 bpm  111 bpm  -  103 bpm  115 bpm   Heart Rate (Exit)  58 bpm  93 bpm  -  88 bpm  89 bpm   Oxygen Saturation (Admit)  92 %  96 %  -  93 %  91 %   Oxygen Saturation (Exercise)  78 %  88 %  -  84 %  88 %   Oxygen Saturation (Exit)  95 %  96 %  -  96 %  95 %    Rating of Perceived Exertion (Exercise)  15  15  -  15  17   Perceived Dyspnea (Exercise)  3.5  4  -  4  3   Symptoms  SOB  SOB  -  SOB  SON   Comments  walk test results  -  -  desaturates on treadmill  satuartions have improved   Duration  -  Progress to 45 minutes of aerobic exercise without signs/symptoms of physical distress  -  Continue with 45 min of aerobic exercise without signs/symptoms of physical distress.  Continue with 45 min of aerobic exercise without signs/symptoms of physical distress.   Intensity  -  THRR unchanged  -  THRR unchanged  THRR unchanged     Progression   Progression  -  Continue to progress workloads to maintain intensity without signs/symptoms of physical distress.  -  Continue to progress workloads to maintain intensity without signs/symptoms of physical distress.  Continue to progress workloads to maintain intensity without signs/symptoms of physical distress.   Average METs  -  2.64  -  2.64  2.48     Resistance Training   Training Prescription  -  Yes  -  Yes  Yes   Weight  -  4 lbs  -  4 lbs  4 lbs   Reps  -  10-15  -  10-15  10-15     Interval Training   Interval Training  -  No  -  No  No     Oxygen   Oxygen  -  Continuous  -  Continuous  Continuous   Liters  -  6  -  6  6 pendant     Treadmill   MPH  -  2  -  1.5  2   Grade  -  0  -  0  0   Minutes  -  15 still intermitten  -  15  15   METs  -  2.53  -  2.15  2.53     Arm Ergometer   Level  -  1  -  1  1   RPM  -  41  -  40  38   Minutes  -  15  -  15  15   METs  -  2.1  -  2.5  2.3     REL-XR   Level  -  1  -  1  1   Speed  -  48  -  43  -   Minutes  -  15  -  15  15   METs  -  3.3  -  3.5  2.6     Home Exercise Plan   Plans to continue exercise at  -  -  Home (comment) walking  Home (comment) walking  Home (comment) walking   Frequency  -  -  Add 1 additional day to program exercise sessions. once he gets new concentrator  Add 1 additional day to program exercise sessions. once he  gets new concentrator  Add 1 additional day to program exercise sessions. once he gets new concentrator   Initial Home Exercises Provided  -  -  03/07/17  03/07/17  03/07/17   Row Name 04/08/17 1600 04/23/17 1500 05/06/17 1500         Response to Exercise   Blood Pressure (Admit)  150/60  130/68  132/70     Blood Pressure (Exit)  104/60  122/64  118/70     Heart Rate (Admit)  82 bpm  77 bpm  102 bpm     Heart Rate (Exercise)  118 bpm  110 bpm  113 bpm     Heart Rate (Exit)  96 bpm  98 bpm  92 bpm     Oxygen Saturation (Admit)  95 %  91 %  89 %     Oxygen Saturation (Exercise)  89 %  87 %  88 %     Oxygen Saturation (Exit)  97 %  94 %  93 %     Rating of Perceived Exertion (Exercise)  '17  14  15     '$ Perceived Dyspnea (Exercise)  '4  3  3     '$ Symptoms  SOB  SOB  SON     Comments  SOB on treadmill  SOB on treadmill  SOB on treadmill     Duration  Continue with 45 min of aerobic exercise without signs/symptoms of physical distress.  Continue with 45 min of aerobic exercise without signs/symptoms of physical distress.  Continue with 45 min of aerobic exercise without signs/symptoms of physical distress.     Intensity  THRR unchanged  THRR unchanged  THRR unchanged       Progression   Progression  Continue to progress workloads to maintain intensity without signs/symptoms of physical distress.  Continue to progress workloads to maintain intensity without signs/symptoms of physical distress.  Continue to progress workloads to maintain intensity without signs/symptoms of physical distress.     Average METs  2.71  2.91  3.04       Resistance Training   Training Prescription  Yes  Yes  Yes     Weight  4 lbs  5 lbs  5 lbs     Reps  10-15  10-15  10-15       Interval Training   Interval Training  No  No  No       Oxygen   Oxygen  Continuous  Continuous  Continuous     Liters  6 pendant  6 pendant  6 pendant       Treadmill   MPH  '2  2  2     '$ Grade  0  0  0     Minutes  '15  15  15      '$ METs  2.53  2.53  2.53       Arm Ergometer   Level  '1  1  1     '$ RPM  48  -  47     Minutes  '15  15  15     '$ METs  2.5  2.3  2.5       REL-XR   Level  '3  3  1     '$ Speed  50  -  -     Minutes  '15  15  15     '$ METs  3.1  3.9  4.1       Home Exercise Plan   Plans to continue exercise at  Home (comment) walking  Home (comment) walking  Home (comment) walking     Frequency  Add 1 additional day to program exercise sessions.  Add 1 additional day to program exercise sessions.  Add 1 additional day to program exercise sessions.     Initial Home Exercises Provided  03/07/17  03/07/17  03/07/17        Exercise Comments: Exercise Comments    Row Name 02/11/17 1617 02/19/17 1127         Exercise Comments  Also completed walk test on 6L oxygen.  Test stopped at 3:15 when saturations dropped to 83%.  1 min 93%, 2 min 87%, 3 min 83%, 4 min 94% (seated).  We will start his exercise as no continuous.  He will start with 2 min intervals especially on the treadmill.  We will see how he is able to handle seated exercise to determine if intermitten exercise is needed on there as well.   First full day of exercise!  Patient was oriented to gym and equipment including functions, settings, policies, and procedures.  Patient's individual exercise prescription and treatment plan were reviewed.  All starting workloads were established based on the results of the 6 minute walk test done at initial orientation visit.  The plan for exercise progression was also introduced and progression will be customized based on patient's performance and goals.         Exercise Goals and Review: Exercise Goals    Row Name 02/11/17 1623             Exercise Goals   Increase Physical Activity  Yes       Intervention  Provide advice, education, support and counseling about physical activity/exercise needs.;Develop an individualized exercise prescription for aerobic and resistive training based on initial evaluation  findings, risk stratification, comorbidities and participant's personal goals.       Expected Outcomes  Achievement of increased cardiorespiratory fitness and enhanced flexibility, muscular endurance and strength shown through measurements of functional capacity and personal statement of participant.       Increase Strength and Stamina  Yes       Intervention  Provide advice, education, support  and counseling about physical activity/exercise needs.;Develop an individualized exercise prescription for aerobic and resistive training based on initial evaluation findings, risk stratification, comorbidities and participant's personal goals.       Expected Outcomes  Achievement of increased cardiorespiratory fitness and enhanced flexibility, muscular endurance and strength shown through measurements of functional capacity and personal statement of participant.          Exercise Goals Re-Evaluation : Exercise Goals Re-Evaluation    Row Name 02/26/17 1525 03/07/17 1246 03/11/17 1456 03/25/17 1502 04/08/17 1513     Exercise Goal Re-Evaluation   Exercise Goals Review  Increase Physical Activity;Increase Strength and Stamina  Increase Physical Activity;Increase Strength and Stamina;Understanding of Exercise Prescription;Knowledge and understanding of Target Heart Rate Range (THRR);Able to understand and use rate of perceived exertion (RPE) scale;Able to understand and use Dyspnea scale;Able to check pulse independently  Increase Physical Activity;Increase Strength and Stamina  Increase Physical Activity;Increase Strength and Stamina  Increase Physical Activity;Increase Strength and Stamina   Comments  Darin Lopez is off to a good start in rehab.  He is limited in activity by his oxygen saturations.  When he is here on 6L, he does well, but just coming in on only 3-4L pulsed wears him out before he even gets started with exercsie.  He is up to 15 min continuous on both seated pieces of equipment.  We will continue to  moniotor his progresion.   Reviewed home exercise with pt today.  Pt plans to walk at home for exercise once he gets his new concentrator.  Reviewed THR, pulse, RPE, sign and symptoms and when to call 911 or MD.  Also discussed weather considerations and indoor options.  Pt voiced understanding.  Darin Lopez continues to do well in rehab.  He continues to desaturate when walking even with 6L of oxygen.  We continue to await a new concentrator for home use.  He is up to 3.5 METs on the XR.  We will continue to monitor his progression.  Darin Lopez has been doing well in rehab.  He sats have been better since using the pendant.  He is now up to 2.0 mph on the treadmill for 15 min!!  We will continue to monitor his progress.   Darin Lopez continues to do well in rehab.  He is going to bring a concentrator in that he picked up at a flea market to test with exercise tomorrow.  He has moved up to level 3 on the XR.  We will continue to monitor his progression.    Expected Outcomes  Short: Darin Lopez will talk to his doctor about increasing his O2 prescription and then we can discuss home exercise.  Long: Continue to build strength and stamina.  Short: Darin Lopez will start to add in exercise once he gets a new portable concentrator that supports continuous flow.  Long: Continue to build strength and stamina.   Short: Increase workloads on seated equipment.  Long: Continue to build stamina on treadmill.  Short: Continue to try to increase workloads.   Long: Continue to work on Animator.   Short: Increase workload on arm crank and look at concentrator.  Long: Continue to increase physical activity.    [place] Name 04/09/17 1309 04/23/17 1520 05/06/17 1501         Exercise Goal Re-Evaluation   Exercise Goals Review  Understanding of Exercise Prescription  Increase Strength and Stamina;Increase Physical Activity  Increase Strength and Stamina;Increase Physical Activity;Understanding of Exercise Prescription     Comments  Darin Lopez walked with  his portable tank on the treadmill while checking his oxygen he stayed around 91-92 percent. He should be able to use for home exercise.  Darin Lopez has been doing well in rehab.  He is doing better on the treadmill and maintain his oxygen saturations around 87-88% for the full 15 min.  We will start looking at increasing his other workloads more as well.   Darin Lopez continues to do well in rehab.  He is already starting to near graduation.  He is using his portable concentrator to walk some at home.  He continues to stay right around 88% when walking on 6L.  His RPE's have continued to be high on the other pieces of equipment.  We will continue to monitor his progress.      Expected Outcomes  Short: use portable tank for home exercise. Long: Continue to exercise independently on portable tank  Short: Start to increase more workloads.  Long: Continue to exercise more independently.  Short: Try to increase workloads despite the high RPE.  Long: Continue to walk more at home.         Discharge Exercise Prescription (Final Exercise Prescription Changes): Exercise Prescription Changes - 05/06/17 1500      Response to Exercise   Blood Pressure (Admit)  132/70    Blood Pressure (Exit)  118/70    Heart Rate (Admit)  102 bpm    Heart Rate (Exercise)  113 bpm    Heart Rate (Exit)  92 bpm    Oxygen Saturation (Admit)  89 %    Oxygen Saturation (Exercise)  88 %    Oxygen Saturation (Exit)  93 %    Rating of Perceived Exertion (Exercise)  15    Perceived Dyspnea (Exercise)  3    Symptoms  SON    Comments  SOB on treadmill    Duration  Continue with 45 min of aerobic exercise without signs/symptoms of physical distress.    Intensity  THRR unchanged      Progression   Progression  Continue to progress workloads to maintain intensity without signs/symptoms of physical distress.    Average METs  3.04      Resistance Training   Training Prescription  Yes    Weight  5 lbs    Reps  10-15      Interval Training    Interval Training  No      Oxygen   Oxygen  Continuous    Liters  6 pendant      Treadmill   MPH  2    Grade  0    Minutes  15    METs  2.53      Arm Ergometer   Level  1    RPM  47    Minutes  15    METs  2.5      REL-XR   Level  1    Minutes  15    METs  4.1      Home Exercise Plan   Plans to continue exercise at  Home (comment) walking    Frequency  Add 1 additional day to program exercise sessions.    Initial Home Exercises Provided  03/07/17       Nutrition:  Target Goals: Understanding of nutrition guidelines, daily intake of sodium '1500mg'$ , cholesterol '200mg'$ , calories 30% from fat and 7% or less from saturated fats, daily to have 5 or more servings of fruits and vegetables.  Biometrics: Pre Biometrics - 02/11/17  1623      Pre Biometrics   Height  6' 0.4" (1.839 m)    Weight  202 lb 4.8 oz (91.8 kg)    Waist Circumference  42.5 inches    Hip Circumference  39 inches    Waist to Hip Ratio  1.09 %    BMI (Calculated)  27.2        Nutrition Therapy Plan and Nutrition Goals: Nutrition Therapy & Goals - 04/30/17 1503      Nutrition Therapy   RD appointment defered  Yes      Personal Nutrition Goals   Comments  Darin Lopez declines meeting with the dietician.       Nutrition Discharge: Rate Your Plate Scores:   Nutrition Goals Re-Evaluation: Nutrition Goals Re-Evaluation    Row Name 03/07/17 1409 04/04/17 1232 04/30/17 1504         Goals   Current Weight  -  203 lb (92.1 kg)  201 lb (91.2 kg)     Nutrition Goal  Healthy Diet  Eat as healthy as he can  Lose a couple pounds and maintain weight.     Comment  Darin Lopez continues to defer a dietician appointment.  He has been eating well, but admits to cheating on occasion wiht country ham and fried shrimp.  He would like to eat more red meat but know that it is not good for him.  Manus Gunning help him keep an eye on his diet and making sure that he eats good.  Darin Lopez defers the dietician appointment. Azalee Course he has been  eating well as of late except for yesterday.   Darin Lopez does not want to lose much weight at this point. He feels like his weight is healthy. He states if he loses a few thats ok. Darin Lopez says eats well and will cut back if he gains any weight.     Expected Outcome  Short: Try not to cheat as much on diet.  Long: Continue healty eating  Short: lose weight by eating healthy. Long: Maintain eating healthy post [facility].  Short: eat smaller portions to lose weight. Long: Maintain a healthy weight.        Nutrition Goals Discharge (Final Nutrition Goals Re-Evaluation): Nutrition Goals Re-Evaluation - 04/30/17 1504      Goals   Current Weight  201 lb (91.2 kg)    Nutrition Goal  Lose a couple pounds and maintain weight.    Comment  Darin Lopez does not want to lose much weight at this point. He feels like his weight is healthy. He states if he loses a few thats ok. Darin Lopez says eats well and will cut back if he gains any weight.    Expected Outcome  Short: eat smaller portions to lose weight. Long: Maintain a healthy weight.       Psychosocial: Target Goals: Acknowledge presence or absence of significant depression and/or stress, maximize coping skills, provide positive support system. Participant is able to verbalize types and ability to use techniques and skills needed for reducing stress and depression.   Initial Review & Psychosocial Screening: Initial Psych Review & Screening - 02/11/17 1446      Initial Review   Current issues with  None Identified      Family Dynamics   Good Support System?  Yes      Barriers   Psychosocial barriers to participate in program  There are no identifiable barriers or psychosocial needs.      Screening Interventions   Interventions  Encouraged to exercise;To provide support and resources with identified psychosocial needs;Yes    Expected Outcomes  Short Term goal: Utilizing psychosocial counselor, staff and physician to assist with identification of specific Stressors or  current issues interfering with healing process. Setting desired goal for each stressor or current issue identified.;Long Term Goal: Stressors or current issues are controlled or eliminated.;Short Term goal: Identification and review with participant of any Quality of Life or Depression concerns found by scoring the questionnaire.;Long Term goal: The participant improves quality of Life and PHQ9 Scores as seen by post scores and/or verbalization of changes       Quality of Life Scores:   PHQ-9: Recent Review Flowsheet Data    Depression screen [facility] 2/9 05/12/2017 03/26/2017 02/11/2017   Decreased Interest '1 2 2   '$ Down, Depressed, Hopeless '2 3 2   '$ PHQ - 2 Score '3 5 4   '$ Altered sleeping 3 0 0   Tired, decreased energy '3 3 2   '$ Change in appetite 2 2 0   Feeling bad or failure about yourself  0 1 0   Trouble concentrating 0 0 0   Moving slowly or fidgety/restless 2 0 0   Suicidal thoughts 0 0 0   PHQ-9 Score '13 11 6   '$ Difficult doing work/chores Somewhat difficult Somewhat difficult Somewhat difficult     Interpretation of Total Score  Total Score Depression Severity:  1-4 = Minimal depression, 5-9 = Mild depression, 10-14 = Moderate depression, 15-19 = Moderately severe depression, 20-27 = Severe depression   Psychosocial Evaluation and Intervention: Psychosocial Evaluation - 02/19/17 1217      Psychosocial Evaluation & Interventions   Interventions  Encouraged to exercise with the program and follow exercise prescription;Relaxation education    Comments  Counselor met with Mr. Basilio today for initial psychosocial evaluation.  He is a 76 year old who is diagnosed with COPD.  Darin Lopez has a strong support system with a spouse of 20 years and several adult children.  He is also actively involved in his local church.  Darin Lopez sleeps well and has a good appetite.  He is on medications for sleep and depression/anxiety and reports it is working well.  Darin Lopez states his mood is generally positive and he has  minimal stress in his life at this time - other than his COPD.  His goals for this program are to breathe better and have a better quality of life.      Expected Outcomes  Darin Lopez will benefit from consistent exercise to achieve his stated goals. The educational and psychoeducational components of this program will be helpful in [place] learning more about his illness and how to manage it better.      Continue Psychosocial Services   Follow up required by staff       Psychosocial Re-Evaluation: Psychosocial Re-Evaluation    [place] Name 03/07/17 1410 04/04/17 1237           Psychosocial Re-Evaluation   Current issues with  None Identified  Current Stress Concerns      Comments  Darin Lopez continues to maintain a positive attitude.  His biggest stressor is just that as he gets older his health is not doing well.  He would like to be able to breathe better.  They are working on getting him a Armed forces operational officer that should hlep.  He says that having Manus Gunning for his support has been the best thing.  He has enjoyed coming to rehab and enjoys the exercise and  the people he gets to meet and interact with while here.   Darin Lopez is stressed with his oxygen tank being heavy. He does not want to be seen in public rolling an oxygen tank arooung so he deals with it. Informed him that with his COPD he needs to use something easier to carry so he does not get so short of breath. Explained to him that he is not alone in his endeavor.       Expected Outcomes  Short: Continue to exercise and come to class for moral support.  Long: Maintain postive attitude  Short:get a lighter oxygen tank to boost moral. Long: Understand his disease process and how to maintain it with minimal stress      Interventions  Encouraged to attend Pulmonary Rehabilitation for the exercise;Stress management education;Relaxation education  Encouraged to attend Pulmonary Rehabilitation for the exercise;Stress management education;Relaxation education       Continue Psychosocial Services   Follow up required by staff  Follow up required by staff         Psychosocial Discharge (Final Psychosocial Re-Evaluation): Psychosocial Re-Evaluation - 04/04/17 1237      Psychosocial Re-Evaluation   Current issues with  Current Stress Concerns    Comments  Darin Lopez is stressed with his oxygen tank being heavy. He does not want to be seen in public rolling an oxygen tank arooung so he deals with it. Informed him that with his COPD he needs to use something easier to carry so he does not get so short of breath. Explained to him that he is not alone in his endeavor.     Expected Outcomes  Short:get a lighter oxygen tank to boost moral. Long: Understand his disease process and how to maintain it with minimal stress    Interventions  Encouraged to attend Pulmonary Rehabilitation for the exercise;Stress management education;Relaxation education    Continue Psychosocial Services   Follow up required by staff       Education: Education Goals: Education classes will be provided on a weekly basis, covering required topics. Participant will state understanding/return demonstration of topics presented.  Learning Barriers/Preferences: Learning Barriers/Preferences - 02/11/17 1510      Learning Barriers/Preferences   Learning Barriers  Sight    Learning Preferences  Individual Instruction;Skilled Demonstration;Verbal Instruction       Education Topics: Initial Evaluation Education: - Verbal, written and demonstration of respiratory meds, RPE/PD scales, oximetry and breathing techniques. Instruction on use of nebulizers and MDIs: cleaning and proper use, rinsing mouth with steroid doses and importance of monitoring MDI activations.   Pulmonary Rehab from 05/16/2017 in [facility] Cardiac and Pulmonary Rehab  Date  02/11/17  Educator  [facility]  Instruction Review Code (retired)  2- meets goals/outcomes  Instruction Review Code  1- IT trainer  Nutrition Guidelines/Fats and Fiber: -Group instruction provided by verbal, written material, models and posters to present the general guidelines for heart healthy nutrition. Gives an explanation and review of dietary fats and fiber.   Pulmonary Rehab from 05/16/2017 in [facility] Cardiac and Pulmonary Rehab  Date  04/14/17  Educator  CR  Instruction Review Code  1- Verbalizes Understanding      Controlling Sodium/Reading Food Labels: -Group verbal and written material supporting the discussion of sodium use in heart healthy nutrition. Review and explanation with models, verbal and written materials for utilization of the food label.   Pulmonary Rehab from 05/16/2017 in [facility] Cardiac and Pulmonary Rehab  Date  04/21/17  Educator  CR  Instruction Review Code  1- Verbalizes Understanding      Exercise Physiology & Risk Factors: - Group verbal and written instruction with models to review the exercise physiology of the cardiovascular system and associated critical values. Details cardiovascular disease risk factors and the goals associated with each risk factor.   Pulmonary Rehab from 05/16/2017 in [facility] Cardiac and Pulmonary Rehab  Date  04/04/17  Educator  [facility]  Instruction Review Code  1- Verbalizes Understanding      Aerobic Exercise & Resistance Training: - Gives group verbal and written discussion on the health impact of inactivity. On the components of aerobic and resistive training programs and the benefits of this training and how to safely progress through these programs.   Pulmonary Rehab from 05/16/2017 in [facility] Cardiac and Pulmonary Rehab  Date  05/02/17  Educator  jh  Instruction Review Code  1- Verbalizes Understanding      Flexibility, Balance, General Exercise Guidelines: - Provides group verbal and written instruction on the benefits of flexibility and balance training programs. Provides general exercise guidelines with specific guidelines to those with heart or lung  disease. Demonstration and skill practice provided.   Stress Management: - Provides group verbal and written instruction about the health risks of elevated stress, cause of high stress, and healthy ways to reduce stress.   Pulmonary Rehab from 05/16/2017 in [facility] Cardiac and Pulmonary Rehab  Date  03/19/17  Educator  [facility]  Instruction Review Code  1- Verbalizes Understanding      Depression: - Provides group verbal and written instruction on the correlation between heart/lung disease and depressed mood, treatment options, and the stigmas associated with seeking treatment.   Pulmonary Rehab from 05/16/2017 in [facility] Cardiac and Pulmonary Rehab  Date  05/14/17  Educator  [facility]  Instruction Review Code  1- Verbalizes Understanding      Exercise & Equipment Safety: - Individual verbal instruction and demonstration of equipment use and safety with use of the equipment.   Pulmonary Rehab from 05/16/2017 in [facility] Cardiac and Pulmonary Rehab  Date  02/11/17  Educator  [facility]  Instruction Review Code  1- Verbalizes Understanding      Infection Prevention: - Provides verbal and written material to individual with discussion of infection control including proper hand washing and proper equipment cleaning during exercise session.   Pulmonary Rehab from 05/16/2017 in [facility] Cardiac and Pulmonary Rehab  Date  02/11/17  Educator  [facility]  Instruction Review Code  2- Demonstrated Understanding      Falls Prevention: - Provides verbal and written material to individual with discussion of falls prevention and safety.   Pulmonary Rehab from 05/16/2017 in [facility] Cardiac and Pulmonary Rehab  Date  02/11/17  Educator  [facility]  Instruction Review Code (retired)  2- meets goals/outcomes  Instruction Review Code  1- Science writer Understanding      Diabetes: - Individual verbal and written instruction to review signs/symptoms of diabetes, desired ranges of glucose level fasting, after meals and with exercise. Advice that  pre and post exercise glucose checks will be done for 3 sessions at entry of program.   Chronic Lung Diseases: - Group verbal and written instruction to review new updates, new respiratory medications, new advancements in procedures and treatments. Provide informative websites and "800" numbers of self-education.   Lung Procedures: - Group verbal and written instruction to describe testing methods done to diagnose lung disease. Review the outcome of test results. Describe the treatment choices: Pulmonary Function Tests,  ABGs and oximetry.   Energy Conservation: - Provide group verbal and written instruction for methods to conserve energy, plan and organize activities. Instruct on pacing techniques, use of adaptive equipment and posture/positioning to relieve shortness of breath.   Pulmonary Rehab from 05/16/2017 in [facility] Cardiac and Pulmonary Rehab  Date  03/12/17  Educator  [facility]  Instruction Review Code  1- Verbalizes Understanding      Triggers: - Group verbal and written instruction to review types of environmental controls: home humidity, furnaces, filters, dust mite/pet prevention, HEPA vacuums. To discuss weather changes, air quality and the benefits of nasal washing.   Pulmonary Rehab from 05/16/2017 in [facility] Cardiac and Pulmonary Rehab  Date  05/07/17  Educator  [facility]  Instruction Review Code  1- Verbalizes Understanding      Exacerbations: - Group verbal and written instruction to provide: warning signs, infection symptoms, calling MD promptly, preventive modes, and value of vaccinations. Review: effective airway clearance, coughing and/or vibration techniques. Create an Sports administrator.   Pulmonary Rehab from 05/16/2017 in [facility] Cardiac and Pulmonary Rehab  Date  05/07/17  Educator  [facility]  Instruction Review Code  1- Verbalizes Understanding      Oxygen: - Individual and group verbal and written instruction on oxygen therapy. Includes supplement oxygen, available portable oxygen  systems, continuous and intermittent flow rates, oxygen safety, concentrators, and Medicare reimbursement for oxygen.   Respiratory Medications: - Group verbal and written instruction to review medications for lung disease. Drug class, frequency, complications, importance of spacers, rinsing mouth after steroid MDI's, and proper cleaning methods for nebulizers.   AED/CPR: - Group verbal and written instruction with the use of models to demonstrate the basic use of the AED with the basic ABC's of resuscitation.   Pulmonary Rehab from 05/16/2017 in [facility] Cardiac and Pulmonary Rehab  Date  03/21/17  Educator  CE  Instruction Review Code  1- Verbalizes Understanding      Breathing Retraining: - Provides individuals verbal and written instruction on purpose, frequency, and proper technique of diaphragmatic breathing and pursed-lipped breathing. Applies individual practice skills.   Anatomy and Physiology of the Lungs: - Group verbal and written instruction with the use of models to provide basic lung anatomy and physiology related to function, structure and complications of lung disease.   Anatomy & Physiology of the Heart: - Group verbal and written instruction and models provide basic cardiac anatomy and physiology, with the coronary electrical and arterial systems. Review of: AMI, Angina, Valve disease, Heart Failure, Cardiac Arrhythmia, Pacemakers, and the ICD.   Pulmonary Rehab from 05/16/2017 in [facility] Cardiac and Pulmonary Rehab  Date  05/16/17  Educator  [place]  Instruction Review Code  1- Verbalizes Understanding      Heart Failure: - Group verbal and written instruction on the basics of heart failure: signs/symptoms, treatments, explanation of ejection fraction, enlarged heart and cardiomyopathy.   Pulmonary Rehab from 05/16/2017 in [facility] Cardiac and Pulmonary Rehab  Date  05/16/17  Educator  [place]  Instruction Review Code  1- Verbalizes Understanding      Sleep Apnea: -  Individual verbal and written instruction to review Obstructive Sleep Apnea. Review of risk factors, methods for diagnosing and types of masks and machines for OSA.   Pulmonary Rehab from 05/16/2017 in [facility] Cardiac and Pulmonary Rehab  Date  05/07/17  Educator  [facility]  Instruction Review Code  1- Verbalizes Understanding      Anxiety: - Provides group, verbal and written instruction on the correlation between  heart/lung disease and anxiety, treatment options, and management of anxiety.   Pulmonary Rehab from 05/16/2017 in [facility] Cardiac and Pulmonary Rehab  Date  03/19/17  Educator  [facility]  Instruction Review Code  1- Verbalizes Understanding      Relaxation: - Provides group, verbal and written instruction about the benefits of relaxation for patients with heart/lung disease. Also provides patients with examples of relaxation techniques.   Pulmonary Rehab from 05/16/2017 in [facility] Cardiac and Pulmonary Rehab  Date  04/16/17  Educator  [facility]  Instruction Review Code  1- Verbalizes Understanding      Cardiac Medications: - Group verbal and written instruction to review commonly prescribed medications for heart disease. Reviews the medication, class of the drug, and side effects.   Pulmonary Rehab from 05/16/2017 in [facility] Cardiac and Pulmonary Rehab  Date  03/07/17  Educator  [facility]  Instruction Review Code  1- Verbalizes Understanding      Know Your Numbers: -Group verbal and written instruction about important numbers in your health.  Review of Cholesterol, Blood Pressure, Diabetes, and BMI and the role they play in your overall health.   Pulmonary Rehab from 05/16/2017 in [facility] Cardiac and Pulmonary Rehab  Date  04/11/17  Educator  [facility]  Instruction Review Code  1- Verbalizes Understanding      Other: -Provides group and verbal instruction on various topics (see comments)    Knowledge Questionnaire Score: Knowledge Questionnaire Score - 05/12/17 1149      Knowledge Questionnaire Score    Pre Score  7/10    Post Score  8/10 reviewed with patient        Core Components/Risk Factors/Patient Goals at Admission: Personal Goals and Risk Factors at Admission - 02/11/17 1515      Core Components/Risk Factors/Patient Goals on Admission    Weight Management  Yes;Weight Loss    Intervention  Weight Management: Develop a combined nutrition and exercise program designed to reach desired caloric intake, while maintaining appropriate intake of nutrient and fiber, sodium and fats, and appropriate energy expenditure required for the weight goal.;Weight Management: Provide education and appropriate resources to help participant work on and attain dietary goals.;Weight Management/Obesity: Establish reasonable short term and long term weight goals.    Admit Weight  202 lb 4.8 oz (91.8 kg)    Goal Weight: Short Term  197 lb (89.4 kg)    Goal Weight: Long Term  192 lb (87.1 kg)    Expected Outcomes  Short Term: Continue to assess and modify interventions until short term weight is achieved;Long Term: Adherence to nutrition and physical activity/exercise program aimed toward attainment of established weight goal;Weight Loss: Understanding of general recommendations for a balanced deficit meal plan, which promotes 1-2 lb weight loss per week and includes a negative energy balance of [phone] kcal/d;Understanding recommendations for meals to include 15-35% energy as protein, 25-35% energy from fat, 35-60% energy from carbohydrates, less than '200mg'$  of dietary cholesterol, 20-35 gm of total fiber daily;Understanding of distribution of calorie intake throughout the day with the consumption of 4-5 meals/snacks    Hypertension  Yes    Intervention  Provide education on lifestyle modifcations including regular physical activity/exercise, weight management, moderate sodium restriction and increased consumption of fresh fruit, vegetables, and low fat dairy, alcohol moderation, and smoking cessation.;Monitor  prescription use compliance.    Expected Outcomes  Short Term: Continued assessment and intervention until BP is < 140/50m HG in hypertensive participants. < 130/872mHG in hypertensive participants with diabetes, heart failure or  chronic kidney disease.;Long Term: Maintenance of blood pressure at goal levels.    Lipids  Yes    Intervention  Provide education and support for participant on nutrition & aerobic/resistive exercise along with prescribed medications to achieve LDL '70mg'$ , HDL >'40mg'$ .    Expected Outcomes  Short Term: Participant states understanding of desired cholesterol values and is compliant with medications prescribed. Participant is following exercise prescription and nutrition guidelines.;Long Term: Cholesterol controlled with medications as prescribed, with individualized exercise RX and with personalized nutrition plan. Value goals: LDL < '70mg'$ , HDL > 40 mg.       Core Components/Risk Factors/Patient Goals Review:  Goals and Risk Factor Review    Row Name 03/07/17 1250 04/04/17 1220 04/30/17 1452         Core Components/Risk Factors/Patient Goals Review   Personal Goals Review  Weight Management/Obesity;Hypertension;Lipids;Improve shortness of breath with ADL's  Weight Management/Obesity;Hypertension;Stress;Lipids  Weight Management/Obesity;Hypertension;Lipids;Stress     Review  Darin Lopez has been doing well in rehab.  His weight has been steady since starting the program.  Darin Lopez's blood pressures have been good and he continues to monitor them at home. He has not had any problems with his medications and lipids seem to be doing well.   Darin Lopez's biggest problems continues to be his shortness of breath and being able to do stuff around the house.    Darin Lopez has not had his lipids checked recently. His weight has not changed much since  the start of the program. His blood pressure has been under control while attending class and he has been checking his at home. He states that his reading are  comparable to ours. Stress levels have been the same.  Darin Lopez has been maintaining his weight. He currently does not want to lose weight. His blood pressure has been under control along with his lipids. His stressors are his shortness of breath at times.     Expected Outcomes  Short: Continue to work on improving his SOB.  Long: Continue to work on risk factor modification  Short: Work on decreasing stres levels by exercising. Long: Maintain exercise to maintain a stress free environment.  Short: attend [facility] regularly to maintain weight. Long: post [facility] maintain a workout routine.        Core Components/Risk Factors/Patient Goals at Discharge (Final Review):  Goals and Risk Factor Review - 04/30/17 1452      Core Components/Risk Factors/Patient Goals Review   Personal Goals Review  Weight Management/Obesity;Hypertension;Lipids;Stress    Review  Darin Lopez has been maintaining his weight. He currently does not want to lose weight. His blood pressure has been under control along with his lipids. His stressors are his shortness of breath at times.    Expected Outcomes  Short: attend [facility] regularly to maintain weight. Long: post [facility] maintain a workout routine.       ITP Comments: ITP Comments    Row Name 02/11/17 1651 02/21/17 1152 02/24/17 0841 02/26/17 1525 02/28/17 1233   ITP Comments  Medical Evaluation Completed. Chart sent for signature and review to Dr. Emily Filbert Director of [place]. Visit diagnosis can be found in [facility] encounter 02/11/17  Patient came in today with spo2 in the low 80's. Informed patient that he may need 4 liters continuous instead of pulsed oxygen. Worked with patient on PLB techniques. He recovers when he rest but requires 6 liters when exercising.  30 day review completed. ITP sent to Dr. Emily Filbert Director of [place]. Continue with ITP unless changes are made  by physician.    Darin Lopez came in today at 83% on his home tank after walking in from the parking lot.   He refuses to use valet or to be dropped off at the curb by his wife.  He needs a portable tank or concentrator that can handle 6L continuous flow like he is on in rehab.  We talked about the importance of discussing with his doctor tomorrow at his pulmonolgy appointment.  He and his wife voiced understanding and will bring it up at their appointment.   Darin Lopez went to [place] yesterday for pulmonary appointment.  He was approved to go on continuous flow for exercise at home.  He was told he will need a few more test before they get the right equipment for him.  We will wait until he has portable continuous flow before initiating home exercise.    Row Name 03/24/17 0839 03/26/17 1050 04/21/17 0836 05/19/17 0857     ITP Comments  30 day review completed. ITP sent to Dr. Emily Filbert Director of [place]. Continue with ITP unless changes are made by physician.    Kens met with home health Nurse for changes to his exercise oxygen prescription. 1 liter on his CPAP, 2 Liters at rest, 6 Liters for Exercise.  30 day review completed. ITP sent to Dr. Emily Filbert Director of [place]. Continue with ITP unless changes are made by physician.    30 day review completed. ITP sent to Dr. Emily Filbert Director of [place]. Continue with ITP unless changes are made by physician.         Comments: 30 day review

## 2017-05-19 NOTE — Progress Notes (Signed)
Daily Session Note  Patient Details  Name: Darin Lopez MRN: [id number] Date of Birth: 1941-02-20 Referring Provider:     Pulmonary Rehab from 02/11/2017 in [place] and Pulmonary Rehab  Referring Provider  [facility]      Encounter Date: 05/19/2017  Check In: Session Check In - 05/19/17 1151      Check-In   Location  [facility]-Cardiac & Pulmonary Rehab    Staff Present  Nada Maclachlan, BA, ACSM CEP, Exercise Physiologist;Kelly Amedeo Plenty, BS, ACSM CEP, Exercise Physiologist;Briteny Fulghum Flavia Shipper    Supervising physician immediately available to respond to emergencies  [facility] immediately available ER MD    Physician(s)  Dr. Quentin Cornwall and Jimmye Norman    Medication changes reported      No    Fall or balance concerns reported     No    Warm-up and Cool-down  Performed as group-led instruction    Resistance Training Performed  Yes    VAD Patient?  No      Pain Assessment   Currently in Pain?  No/denies          Social History   Tobacco Use  Smoking Status Former Smoker  . Packs/day: 3.00  . Years: 30.00  . Pack years: 90.00  . Types: Cigarettes  Smokeless Tobacco Never Used  Tobacco Comment   Quit over 35 yrs ago    Goals Met:  Independence with exercise equipment Exercise tolerated well No report of cardiac concerns or symptoms Strength training completed today  Goals Unmet:  Not Applicable  Comments: Pt able to follow exercise prescription today without complaint.  Will continue to monitor for progression.   Dr. Emily Filbert is Medical Director for [place] and [facility].

## 2017-05-19 NOTE — Patient Instructions (Signed)
Discharge Instructions  Patient Details  Name: Darin Lopez MRN: [id number] Date of Birth: March 07, 1941 Referring Provider:  Lynnell Jude, MD   Number of Visits: 36/36  Reason for Discharge:  Patient reached a stable level of exercise. Patient independent in their exercise. Patient has met program and personal goals.  Smoking History:  Social History   Tobacco Use  Smoking Status Former Smoker  . Packs/day: 3.00  . Years: 30.00  . Pack years: 90.00  . Types: Cigarettes  Smokeless Tobacco Never Used  Tobacco Comment   Quit over 35 yrs ago    Diagnosis:  Chronic obstructive pulmonary disease, unspecified COPD type ([place])  Initial Exercise Prescription: Initial Exercise Prescription - 02/11/17 1600      Date of Initial Exercise RX and Referring Provider   Date  02/11/17    Referring Provider  [facility]      Oxygen   Oxygen  Continuous    Liters  6      Treadmill   MPH  2    Grade  0    Minutes  8 2 min intervals x4    METs  2.53      Arm Ergometer   Level  2    Watts  29    RPM  25    Minutes  15    METs  2      REL-XR   Level  1    Speed  50    Minutes  15    METs  2      Prescription Details   Frequency (times per week)  3    Duration  Progress to 45 minutes of aerobic exercise without signs/symptoms of physical distress      Intensity   THRR 40-80% of Max Heartrate  115-135    Ratings of Perceived Exertion  11-13    Perceived Dyspnea  0-4      Progression   Progression  Continue to progress workloads to maintain intensity without signs/symptoms of physical distress.      Resistance Training   Training Prescription  Yes    Weight  4 lbs    Reps  10-15       Discharge Exercise Prescription (Final Exercise Prescription Changes): Exercise Prescription Changes - 05/19/17 1600      Response to Exercise   Blood Pressure (Admit)  118/58    Blood Pressure (Exit)  102/62    Heart Rate (Admit)  89 bpm    Heart Rate (Exercise)  111 bpm    Heart Rate (Exit)  96 bpm    Oxygen Saturation (Admit)  89 %    Oxygen Saturation (Exercise)  83 %    Oxygen Saturation (Exit)  94 %    Rating of Perceived Exertion (Exercise)  15    Perceived Dyspnea (Exercise)  4    Symptoms  SOB    Comments  SOB on treadmill    Duration  Continue with 45 min of aerobic exercise without signs/symptoms of physical distress.    Intensity  THRR unchanged      Progression   Progression  Continue to progress workloads to maintain intensity without signs/symptoms of physical distress.    Average METs  2.78      Resistance Training   Training Prescription  Yes    Weight  5 lbs    Reps  10-15      Interval Training   Interval Training  No  Oxygen   Oxygen  Continuous    Liters  6 pendant      Treadmill   MPH  2    Grade  0    Minutes  15    METs  2.53      Arm Ergometer   Level  1    Minutes  15    METs  2.3      REL-XR   Level  3    Minutes  15    METs  3.5      Home Exercise Plan   Plans to continue exercise at  Home (comment) walking    Frequency  Add 2 additional days to program exercise sessions.    Initial Home Exercises Provided  03/07/17       Functional Capacity: 6 Minute Walk    Row Name 02/11/17 1612 05/14/17 1235       6 Minute Walk   Phase  Initial  Discharge    Distance  722 feet  1200 feet    Distance % Change  -  66.2 %    Distance Feet Change  -  478 ft    Walk Time  3.58 minutes  6 minutes    # of Rest Breaks  0 test stopped at 3:35 due to desaturation  0    MPH  2.29  2.27    METS  1.98  2.83    RPE  15  16    Perceived Dyspnea   3.5  3.5    VO2 Peak  6.93  9.91    Symptoms  Yes (comment)  Yes (comment)    Comments  SOB  SOB    Resting HR  95 bpm  91 bpm    Resting BP  126/66  134/72    Resting Oxygen Saturation   -  94 %    Exercise Oxygen Saturation  during 6 min walk  -  84 %    Max Ex. HR  120 bpm  123 bpm    Max Ex. BP  132/74  136/80    2 Minute Post BP  126/56  136/80      Interval  HR   Baseline HR (retired)  95  -    1 Minute HR  120  102    2 Minute HR  121  110    3 Minute HR  125  118    4 Minute HR  115 test stopped at 3:35 HR was 110  117    5 Minute HR  94  123    6 Minute HR  58  120    2 Minute Post HR  -  91    Interval Heart Rate?  Yes  Yes      Interval Oxygen   Interval Oxygen?  Yes  Yes    Baseline Oxygen Saturation %  92 %  94 %    Resting Liters of Oxygen  4 L pulsed  -    1 Minute Oxygen Saturation %  87 %  92 %    1 Minute Liters of Oxygen  4 L  5 L pulsed    2 Minute Oxygen Saturation %  83 %  87 %    2 Minute Liters of Oxygen  4 L  5 L    3 Minute Oxygen Saturation %  79 %  85 %    3 Minute Liters of Oxygen  4 L  5 L  4 Minute Oxygen Saturation %  80 % test stopped at 3:35 SaO2 was 78%  84 %    4 Minute Liters of Oxygen  4 L  5 L    5 Minute Oxygen Saturation %  90 %  85 %    5 Minute Liters of Oxygen  4 L  5 L    6 Minute Oxygen Saturation %  95 %  86 %    6 Minute Liters of Oxygen  4 L  5 L    2 Minute Post Oxygen Saturation %  -  91 %    2 Minute Post Liters of Oxygen  -  5 L       Quality of Life:   Personal Goals: Goals established at orientation with interventions provided to work toward goal. Personal Goals and Risk Factors at Admission - 02/11/17 1515      Core Components/Risk Factors/Patient Goals on Admission    Weight Management  Yes;Weight Loss    Intervention  Weight Management: Develop a combined nutrition and exercise program designed to reach desired caloric intake, while maintaining appropriate intake of nutrient and fiber, sodium and fats, and appropriate energy expenditure required for the weight goal.;Weight Management: Provide education and appropriate resources to help participant work on and attain dietary goals.;Weight Management/Obesity: Establish reasonable short term and long term weight goals.    Admit Weight  202 lb 4.8 oz (91.8 kg)    Goal Weight: Short Term  197 lb (89.4 kg)    Goal Weight: Long  Term  192 lb (87.1 kg)    Expected Outcomes  Short Term: Continue to assess and modify interventions until short term weight is achieved;Long Term: Adherence to nutrition and physical activity/exercise program aimed toward attainment of established weight goal;Weight Loss: Understanding of general recommendations for a balanced deficit meal plan, which promotes 1-2 lb weight loss per week and includes a negative energy balance of [phone] kcal/d;Understanding recommendations for meals to include 15-35% energy as protein, 25-35% energy from fat, 35-60% energy from carbohydrates, less than 263m of dietary cholesterol, 20-35 gm of total fiber daily;Understanding of distribution of calorie intake throughout the day with the consumption of 4-5 meals/snacks    Hypertension  Yes    Intervention  Provide education on lifestyle modifcations including regular physical activity/exercise, weight management, moderate sodium restriction and increased consumption of fresh fruit, vegetables, and low fat dairy, alcohol moderation, and smoking cessation.;Monitor prescription use compliance.    Expected Outcomes  Short Term: Continued assessment and intervention until BP is < 140/972mHG in hypertensive participants. < 130/8041mG in hypertensive participants with diabetes, heart failure or chronic kidney disease.;Long Term: Maintenance of blood pressure at goal levels.    Lipids  Yes    Intervention  Provide education and support for participant on nutrition & aerobic/resistive exercise along with prescribed medications to achieve LDL <63m18mDL >40mg42m Expected Outcomes  Short Term: Participant states understanding of desired cholesterol values and is compliant with medications prescribed. Participant is following exercise prescription and nutrition guidelines.;Long Term: Cholesterol controlled with medications as prescribed, with individualized exercise RX and with personalized nutrition plan. Value goals: LDL < 63mg,37m  > 40 mg.        Personal Goals Discharge: Goals and Risk Factor Review - 04/30/17 1452      Core Components/Risk Factors/Patient Goals Review   Personal Goals Review  Weight Management/Obesity;Hypertension;Lipids;Stress    Review  Ken haYvone Neueen maintaining his weight. He currently  does not want to lose weight. His blood pressure has been under control along with his lipids. His stressors are his shortness of breath at times.    Expected Outcomes  Short: attend [facility] regularly to maintain weight. Long: post [facility] maintain a workout routine.       Exercise Goals and Review: Exercise Goals    Row Name 02/11/17 1623             Exercise Goals   Increase Physical Activity  Yes       Intervention  Provide advice, education, support and counseling about physical activity/exercise needs.;Develop an individualized exercise prescription for aerobic and resistive training based on initial evaluation findings, risk stratification, comorbidities and participant's personal goals.       Expected Outcomes  Achievement of increased cardiorespiratory fitness and enhanced flexibility, muscular endurance and strength shown through measurements of functional capacity and personal statement of participant.       Increase Strength and Stamina  Yes       Intervention  Provide advice, education, support and counseling about physical activity/exercise needs.;Develop an individualized exercise prescription for aerobic and resistive training based on initial evaluation findings, risk stratification, comorbidities and participant's personal goals.       Expected Outcomes  Achievement of increased cardiorespiratory fitness and enhanced flexibility, muscular endurance and strength shown through measurements of functional capacity and personal statement of participant.          Nutrition & Weight - Outcomes: Pre Biometrics - 02/11/17 1623      Pre Biometrics   Height  6' 0.4" (1.839 m)    Weight  202 lb  4.8 oz (91.8 kg)    Waist Circumference  42.5 inches    Hip Circumference  39 inches    Waist to Hip Ratio  1.09 %    BMI (Calculated)  27.2        Nutrition: Nutrition Therapy & Goals - 04/30/17 1503      Nutrition Therapy   RD appointment defered  Yes      Personal Nutrition Goals   Comments  Yvone Neu declines meeting with the dietician.       Nutrition Discharge:   Education Questionnaire Score: Knowledge Questionnaire Score - 05/12/17 1149      Knowledge Questionnaire Score   Pre Score  7/10    Post Score  8/10 reviewed with patient       Goals reviewed with patient; copy given to patient.

## 2017-05-20 DIAGNOSIS — Z6826 Body mass index (BMI) 26.0-26.9, adult: Secondary | ICD-10-CM | POA: Diagnosis not present

## 2017-05-20 DIAGNOSIS — K51919 Ulcerative colitis, unspecified with unspecified complications: Secondary | ICD-10-CM | POA: Diagnosis not present

## 2017-05-21 DIAGNOSIS — J449 Chronic obstructive pulmonary disease, unspecified: Secondary | ICD-10-CM | POA: Diagnosis not present

## 2017-05-21 NOTE — Progress Notes (Signed)
Discharge Progress Report  Patient Details  Name: Darin Lopez MRN: [id number] Date of Birth: 1940/09/14 Referring Provider:     Pulmonary Rehab from 02/11/2017 in [place] [facility]  Referring Provider  [facility]       Number of Visits: 36/36  Reason for Discharge:  Patient reached a stable level of exercise. Patient independent in their exercise. Patient has met program and personal goals.  Smoking History:  Social History   Tobacco Use  Smoking Status Former Smoker  . Packs/day: 3.00  . Years: 30.00  . Pack years: 90.00  . Types: Cigarettes  Smokeless Tobacco Never Used  Tobacco Comment   Quit over 35 yrs ago    Diagnosis:  Chronic obstructive pulmonary disease, unspecified COPD type ([place])  ADL [facility]: Pulmonary Assessment Scores    Row Name 02/11/17 1448 03/26/17 1133 05/12/17 1147     ADL [facility]   ADL Phase  Entry  Mid  Exit   SOB Score total  76  80  30   Rest  _0 Walk  _1 Stairs  _2 Bath  2  2  0   Dress  2  3  0   Shop  _3 CAT Score   CAT Score  30  -  26     mMRC Score   mMRC Score  3  -  -      Initial Exercise Prescription: Initial Exercise Prescription - 02/11/17 1600      Date of Initial Exercise RX and Referring Provider   Date  02/11/17    Referring Provider  [facility]      Oxygen   Oxygen  Continuous    Liters  6      Treadmill   MPH  2    Grade  0    Minutes  8 2 min intervals x4    METs  2.53      Arm Ergometer   Level  2    Watts  29    RPM  25    Minutes  15    METs  2      REL-XR   Level  1    Speed  50    Minutes  15    METs  2      Prescription Details   Frequency (times per week)  3    Duration  Progress to 45 minutes of aerobic exercise without signs/symptoms of physical distress      Intensity   THRR 40-80% of Max Heartrate  115-135    Ratings of Perceived Exertion  11-13    Perceived Dyspnea  0-4      Progression   Progression  Continue to progress  workloads to maintain intensity without signs/symptoms of physical distress.      Resistance Training   Training Prescription  Yes    Weight  4 lbs    Reps  10-15       Discharge Exercise Prescription (Final Exercise Prescription Changes): Exercise Prescription Changes - 05/19/17 1600      Response to Exercise   Blood Pressure (Admit)  118/58    Blood Pressure (Exit)  102/62    Heart Rate (Admit)  89 bpm    Heart Rate (Exercise)  111 bpm    Heart Rate (Exit)  96 bpm  Oxygen Saturation (Admit)  89 %    Oxygen Saturation (Exercise)  83 %    Oxygen Saturation (Exit)  94 %    Rating of Perceived Exertion (Exercise)  15    Perceived Dyspnea (Exercise)  4    Symptoms  SOB    Comments  SOB on treadmill    Duration  Continue with 45 min of aerobic exercise without signs/symptoms of physical distress.    Intensity  THRR unchanged      Progression   Progression  Continue to progress workloads to maintain intensity without signs/symptoms of physical distress.    Average METs  2.78      Resistance Training   Training Prescription  Yes    Weight  5 lbs    Reps  10-15      Interval Training   Interval Training  No      Oxygen   Oxygen  Continuous    Liters  6 pendant      Treadmill   MPH  2    Grade  0    Minutes  15    METs  2.53      Arm Ergometer   Level  1    Minutes  15    METs  2.3      REL-XR   Level  3    Minutes  15    METs  3.5      Home Exercise Plan   Plans to continue exercise at  Home (comment) walking    Frequency  Add 2 additional days to program exercise sessions.    Initial Home Exercises Provided  03/07/17       Functional Capacity: 6 Minute Walk    Row Name 02/11/17 1612 05/14/17 1235       6 Minute Walk   Phase  Initial  Discharge    Distance  722 feet  1200 feet    Distance % Change  -  66.2 %    Distance Feet Change  -  478 ft    Walk Time  3.58 minutes  6 minutes    # of Rest Breaks  0 test stopped at 3:35 due to desaturation   0    MPH  2.29  2.27    METS  1.98  2.83    RPE  15  16    Perceived Dyspnea   3.5  3.5    VO2 Peak  6.93  9.91    Symptoms  Yes (comment)  Yes (comment)    Comments  SOB  SOB    Resting HR  95 bpm  91 bpm    Resting BP  126/66  134/72    Resting Oxygen Saturation   -  94 %    Exercise Oxygen Saturation  during 6 min walk  -  84 %    Max Ex. HR  120 bpm  123 bpm    Max Ex. BP  132/74  136/80    2 Minute Post BP  126/56  136/80      Interval HR   Baseline HR (retired)  95  -    1 Minute HR  120  102    2 Minute HR  121  110    3 Minute HR  125  118    4 Minute HR  115 test stopped at 3:35 HR was 110  117    5 Minute HR  94  123    6  Minute HR  58  120    2 Minute Post HR  -  91    Interval Heart Rate?  Yes  Yes      Interval Oxygen   Interval Oxygen?  Yes  Yes    Baseline Oxygen Saturation %  92 %  94 %    Resting Liters of Oxygen  4 L pulsed  -    1 Minute Oxygen Saturation %  87 %  92 %    1 Minute Liters of Oxygen  4 L  5 L pulsed    2 Minute Oxygen Saturation %  83 %  87 %    2 Minute Liters of Oxygen  4 L  5 L    3 Minute Oxygen Saturation %  79 %  85 %    3 Minute Liters of Oxygen  4 L  5 L    4 Minute Oxygen Saturation %  80 % test stopped at 3:35 SaO2 was 78%  84 %    4 Minute Liters of Oxygen  4 L  5 L    5 Minute Oxygen Saturation %  90 %  85 %    5 Minute Liters of Oxygen  4 L  5 L    6 Minute Oxygen Saturation %  95 %  86 %    6 Minute Liters of Oxygen  4 L  5 L    2 Minute Post Oxygen Saturation %  -  91 %    2 Minute Post Liters of Oxygen  -  5 L       Psychological, QOL, Others - Outcomes: PHQ 2/9: Depression screen [facility] 2/9 05/12/2017 03/26/2017 02/11/2017  Decreased Interest _0 Down, Depressed, Hopeless _1 PHQ - 2 Score _2 Altered sleeping 3 0 0  Tired, decreased energy _3 Change in appetite 2 2 0  Feeling bad or failure about yourself  0 1 0  Trouble concentrating 0 0 0  Moving slowly or fidgety/restless 2 0 0  Suicidal  thoughts 0 0 0  PHQ-9 Score _4 Difficult doing work/chores Somewhat difficult Somewhat difficult Somewhat difficult    Quality of Life:   Personal Goals: Goals established at orientation with interventions provided to work toward goal. Personal Goals and Risk Factors at Admission - 02/11/17 1515      Core Components/Risk Factors/Patient Goals on Admission    Weight Management  Yes;Weight Loss    Intervention  Weight Management: Develop a combined nutrition and exercise program designed to reach desired caloric intake, while maintaining appropriate intake of nutrient and fiber, sodium and fats, and appropriate energy expenditure required for the weight goal.;Weight Management: Provide education and appropriate resources to help participant work on and attain dietary goals.;Weight Management/Obesity: Establish reasonable short term and long term weight goals.    Admit Weight  202 lb 4.8 oz (91.8 kg)    Goal Weight: Short Term  197 lb (89.4 kg)    Goal Weight: Long Term  192 lb (87.1 kg)    Expected Outcomes  Short Term: Continue to assess and modify interventions until short term weight is achieved;Long Term: Adherence to nutrition and physical activity/exercise program aimed toward attainment of established weight goal;Weight Loss: Understanding of general recommendations for a balanced deficit meal plan, which promotes 1-2 lb weight loss per week and includes a negative energy balance of [phone] kcal/d;Understanding recommendations for meals to include  15-35% energy as protein, 25-35% energy from fat, 35-60% energy from carbohydrates, less than 28m of dietary cholesterol, 20-35 gm of total fiber daily;Understanding of distribution of calorie intake throughout the day with the consumption of 4-5 meals/snacks    Hypertension  Yes    Intervention  Provide education on lifestyle modifcations including regular physical activity/exercise, weight management, moderate sodium restriction and  increased consumption of fresh fruit, vegetables, and low fat dairy, alcohol moderation, and smoking cessation.;Monitor prescription use compliance.    Expected Outcomes  Short Term: Continued assessment and intervention until BP is < 140/958mHG in hypertensive participants. < 130/8040mG in hypertensive participants with diabetes, heart failure or chronic kidney disease.;Long Term: Maintenance of blood pressure at goal levels.    Lipids  Yes    Intervention  Provide education and support for participant on nutrition & aerobic/resistive exercise along with prescribed medications to achieve LDL <40m20mDL >40mg62m Expected Outcomes  Short Term: Participant states understanding of desired cholesterol values and is compliant with medications prescribed. Participant is following exercise prescription and nutrition guidelines.;Long Term: Cholesterol controlled with medications as prescribed, with individualized exercise RX and with personalized nutrition plan. Value goals: LDL < 40mg,65m > 40 mg.        Personal Goals Discharge: Goals and Risk Factor Review    Row Name 03/07/17 1250 04/04/17 1220 04/30/17 1452         Core Components/Risk Factors/Patient Goals Review   Personal Goals Review  Weight Management/Obesity;Hypertension;Lipids;Improve shortness of breath with ADL's  Weight Management/Obesity;Hypertension;Stress;Lipids  Weight Management/Obesity;Hypertension;Lipids;Stress     Review  Ken haYvone Neueen doing well in rehab.  His weight has been steady since starting the program.  Ken's blood pressures have been good and he continues to monitor them at home. He has not had any problems with his medications and lipids seem to be doing well.   Ken's biggest problems continues to be his shortness of breath and being able to do stuff around the house.    Ken haYvone Neuot had his lipids checked recently. His weight has not changed much since  the start of the program. His blood pressure has been under control  while attending class and he has been checking his at home. He states that his reading are comparable to ours. Stress levels have been the same.  Ken haYvone Neueen maintaining his weight. He currently does not want to lose weight. His blood pressure has been under control along with his lipids. His stressors are his shortness of breath at times.     Expected Outcomes  Short: Continue to work on improving his SOB.  Long: Continue to work on risk factor modification  Short: Work on decreasing stres levels by exercising. Long: Maintain exercise to maintain a stress free environment.  Short: attend [facility] regularly to maintain weight. Long: post [facility] maintain a workout routine.        Exercise Goals and Review: Exercise Goals    Row Name 02/11/17 1623             Exercise Goals   Increase Physical Activity  Yes       Intervention  Provide advice, education, support and counseling about physical activity/exercise needs.;Develop an individualized exercise prescription for aerobic and resistive training based on initial evaluation findings, risk stratification, comorbidities and participant's personal goals.       Expected Outcomes  Achievement of increased cardiorespiratory fitness and enhanced flexibility, muscular endurance and strength shown through measurements of  functional capacity and personal statement of participant.       Increase Strength and Stamina  Yes       Intervention  Provide advice, education, support and counseling about physical activity/exercise needs.;Develop an individualized exercise prescription for aerobic and resistive training based on initial evaluation findings, risk stratification, comorbidities and participant's personal goals.       Expected Outcomes  Achievement of increased cardiorespiratory fitness and enhanced flexibility, muscular endurance and strength shown through measurements of functional capacity and personal statement of participant.           Nutrition & Weight - Outcomes: Pre Biometrics - 02/11/17 1623      Pre Biometrics   Height  6' 0.4" (1.839 m)    Weight  202 lb 4.8 oz (91.8 kg)    Waist Circumference  42.5 inches    Hip Circumference  39 inches    Waist to Hip Ratio  1.09 %    BMI (Calculated)  27.2        Nutrition: Nutrition Therapy & Goals - 04/30/17 1503      Nutrition Therapy   RD appointment defered  Yes      Personal Nutrition Goals   Comments  Yvone Neu declines meeting with the dietician.       Nutrition Discharge:   Education Questionnaire Score: Knowledge Questionnaire Score - 05/12/17 1149      Knowledge Questionnaire Score   Pre Score  7/10    Post Score  8/10 reviewed with patient       Goals reviewed with patient; copy given to patient.

## 2017-05-21 NOTE — Progress Notes (Signed)
Pulmonary Individual Treatment Plan  Patient Details  Name: Darin Lopez MRN: [id number] Date of Birth: [birth year] Referring Provider:     Pulmonary Rehab from 02/11/2017 in [facility] Cardiac and Pulmonary Rehab  Referring Provider  [facility]      Initial Encounter Date:    Pulmonary Rehab from 02/11/2017 in [facility] Cardiac and Pulmonary Rehab  Date  02/11/17  Referring Provider  [facility]      Visit Diagnosis: Chronic obstructive pulmonary disease, unspecified COPD type ([place])  Patient's Home Medications on Admission:  Current Outpatient Medications:  .  acidophilus (RISAQUAD) CAPS capsule, Take 1 capsule by mouth daily., Disp: , Rfl:  .  albuterol (PROVENTIL HFA;VENTOLIN HFA) 108 (90 Base) MCG/ACT inhaler, Inhale 2 puffs into the lungs every 6 (six) hours as needed for wheezing or shortness of breath., Disp: 1 Inhaler, Rfl: 2 .  benzonatate (TESSALON PERLES) 100 MG capsule, Take 1 capsule (100 mg total) by mouth 3 (three) times daily as needed for cough., Disp: 20 capsule, Rfl: 0 .  Budesonide (UCERIS) 9 MG TB24, Take 9 mg by mouth daily., Disp: , Rfl:  .  budesonide-formoterol (SYMBICORT) 160-4.5 MCG/ACT inhaler, Inhale 2 puffs into the lungs 2 (two) times daily., Disp: , Rfl:  .  cephALEXin (KEFLEX) 500 MG capsule, Take 1 capsule (500 mg total) by mouth every 12 (twelve) hours., Disp: 10 capsule, Rfl: 0 .  chlorpheniramine-HYDROcodone (TUSSIONEX) 10-8 MG/5ML SUER, Take 5 mLs by mouth every 12 (twelve) hours as needed for cough., Disp: 115 mL, Rfl: 0 .  Coenzyme Q10 (CO Q 10) 60 MG CAPS, Take 60 mg by mouth daily., Disp: , Rfl:  .  diphenhydrAMINE (BENADRYL) 25 MG tablet, Take 25 mg by mouth at bedtime as needed for allergies or sleep. , Disp: , Rfl:  .  gabapentin (NEURONTIN) 300 MG capsule, Take 300 mg by mouth at bedtime. , Disp: , Rfl:  .  mercaptopurine (PURINETHOL) 50 MG tablet, Take 100 mg by mouth daily., Disp: , Rfl:  .  mesalamine (PENTASA) 250 MG CR capsule, Take 1,000 mg by  mouth daily. , Disp: , Rfl:  .  Multiple Vitamin (MULTIVITAMIN WITH MINERALS) TABS tablet, Take 1 tablet by mouth daily., Disp: , Rfl:  .  nitroGLYCERIN (NITROSTAT) 0.4 MG SL tablet, Place 0.4 mg under the tongue every 5 (five) minutes as needed for chest pain., Disp: , Rfl:  .  omeprazole (PRILOSEC) 20 MG capsule, Take 20 mg by mouth daily., Disp: , Rfl:  .  pravastatin (PRAVACHOL) 20 MG tablet, Take 20 mg by mouth at bedtime. , Disp: , Rfl:  .  predniSONE (DELTASONE) 10 MG tablet, Take 10 mg by mouth daily., Disp: , Rfl:  .  predniSONE (DELTASONE) 5 MG tablet, 40 mg po daily for 2 days, 20 mg po daily for 2 days, then continue home dose 10 mg po daily., Disp: 30 tablet, Rfl: 0 .  salmeterol (SEREVENT) 50 MCG/DOSE diskus inhaler, Inhale 1 puff into the lungs daily., Disp: , Rfl:  .  sertraline (ZOLOFT) 50 MG tablet, Take 100 mg by mouth daily. , Disp: , Rfl:  .  terazosin (HYTRIN) 10 MG capsule, Take 20 mg by mouth at bedtime. , Disp: , Rfl:  .  tiotropium (SPIRIVA) 18 MCG inhalation capsule, Place 1 capsule (18 mcg total) into inhaler and inhale daily., Disp: 30 capsule, Rfl: 2 .  zolpidem (AMBIEN) 10 MG tablet, Take 5-10 mg by mouth at bedtime as needed for sleep. , Disp: , Rfl:  Past Medical History: Past Medical History:  Diagnosis Date  . Colitis   . COPD (chronic obstructive pulmonary disease) ([place])   . Depression   . Emphysema   . Emphysema of lung ([place])   . GERD (gastroesophageal reflux disease)   . Heart murmur    "all my life"  . Hypertension    Hx - denies current issues  . On home oxygen therapy    4L - continuous  . Shortness of breath dyspnea   . Sleep apnea    CPAP  . Wears dentures    full upper and lower    Tobacco Use: Social History   Tobacco Use  Smoking Status Former Smoker  . Packs/day: 3.00  . Years: 30.00  . Pack years: 90.00  . Types: Cigarettes  Smokeless Tobacco Never Used  Tobacco Comment   Quit over 35 yrs ago    Labs: Recent Review  Flowsheet Data    There is no flowsheet data to display.       Pulmonary Assessment Scores: Pulmonary Assessment Scores    Row Name 02/11/17 1448 03/26/17 1133 05/12/17 1147     ADL UCSD   ADL Phase  Entry  Mid  Exit   SOB Score total  76  80  30   Rest  _0 Walk  _1 Stairs  _2 Bath  2  2  0   Dress  2  3  0   Shop  _3 CAT Score   CAT Score  30  -  26     mMRC Score   mMRC Score  3  -  -      Pulmonary Function Assessment: Pulmonary Function Assessment - 02/11/17 1507      Initial Spirometry Results   FVC%  59 %    FEV1%  51 %    FEV1/FVC Ratio  63.43      Post Bronchodilator Spirometry Results   FVC%  64.31 %    FEV1%  58.14 %    FEV1/FVC Ratio  66.62      Breath   Bilateral Breath Sounds  Clear;Decreased    Shortness of Breath  Limiting activity;Yes       Exercise Target Goals:    Exercise Program Goal: Individual exercise prescription set with THRR, safety & activity barriers. Participant demonstrates ability to understand and report RPE using BORG scale, to self-measure pulse accurately, and to acknowledge the importance of the exercise prescription.  Exercise Prescription Goal: Starting with aerobic activity 30 plus minutes a day, 3 days per week for initial exercise prescription. Provide home exercise prescription and guidelines that participant acknowledges understanding prior to discharge.  Activity Barriers & Risk Stratification: Activity Barriers & Cardiac Risk Stratification - 02/11/17 1617      Activity Barriers & Cardiac Risk Stratification   Activity Barriers  Deconditioning;Muscular Weakness;Shortness of Breath       6 Minute Walk: 6 Minute Walk    Row Name 02/11/17 1612 05/14/17 1235       6 Minute Walk   Phase  Initial  Discharge    Distance  722 feet  1200 feet    Distance % Change  -  66.2 %    Distance Feet Change  -  478 ft    Walk Time  3.58 minutes  6 minutes    #  of Rest Breaks  0 test  stopped at 3:35 due to desaturation  0    MPH  2.29  2.27    METS  1.98  2.83    RPE  15  16    Perceived Dyspnea   3.5  3.5    VO2 Peak  6.93  9.91    Symptoms  Yes (comment)  Yes (comment)    Comments  SOB  SOB    Resting HR  95 bpm  91 bpm    Resting BP  126/66  134/72    Resting Oxygen Saturation   -  94 %    Exercise Oxygen Saturation  during 6 min walk  -  84 %    Max Ex. HR  120 bpm  123 bpm    Max Ex. BP  132/74  136/80    2 Minute Post BP  126/56  136/80      Interval HR   Baseline HR (retired)  95  -    1 Minute HR  120  102    2 Minute HR  121  110    3 Minute HR  125  118    4 Minute HR  115 test stopped at 3:35 HR was 110  117    5 Minute HR  94  123    6 Minute HR  58  120    2 Minute Post HR  -  91    Interval Heart Rate?  Yes  Yes      Interval Oxygen   Interval Oxygen?  Yes  Yes    Baseline Oxygen Saturation %  92 %  94 %    Resting Liters of Oxygen  4 L pulsed  -    1 Minute Oxygen Saturation %  87 %  92 %    1 Minute Liters of Oxygen  4 L  5 L pulsed    2 Minute Oxygen Saturation %  83 %  87 %    2 Minute Liters of Oxygen  4 L  5 L    3 Minute Oxygen Saturation %  79 %  85 %    3 Minute Liters of Oxygen  4 L  5 L    4 Minute Oxygen Saturation %  80 % test stopped at 3:35 SaO2 was 78%  84 %    4 Minute Liters of Oxygen  4 L  5 L    5 Minute Oxygen Saturation %  90 %  85 %    5 Minute Liters of Oxygen  4 L  5 L    6 Minute Oxygen Saturation %  95 %  86 %    6 Minute Liters of Oxygen  4 L  5 L    2 Minute Post Oxygen Saturation %  -  91 %    2 Minute Post Liters of Oxygen  -  5 L      Oxygen Initial Assessment: Oxygen Initial Assessment - 02/11/17 1458      Home Oxygen   Home Oxygen Device  Liquid Oxygen;Home Concentrator    Sleep Oxygen Prescription  CPAP    Liters per minute  5    Home Exercise Oxygen Prescription  Continuous    Liters per minute  5    Home at Rest Exercise Oxygen Prescription  Continuous    Liters per minute  5     Compliance with Home Oxygen Use  Yes  Initial 6 min Walk   Oxygen Used  Pulsed;Liquid Oxygen    Liters per minute  4    Resting Oxygen Saturation   92 %    Exercise Oxygen Saturation  during 6 min walk  78 %      Program Oxygen Prescription   Program Oxygen Prescription  Continuous;E-Tanks    Liters per minute  6    Comments  Still desaturated during walk on 6L.  We will have the patient start with intermitten exercise, especially on the treadmill.       Intervention   Short Term Goals  To learn and exhibit compliance with exercise, home and travel O2 prescription;To Learn and understand importance of maintaining oxygen saturations>88%;To learn and understand importance of monitoring SPO2 with pulse oximeter and demonstrate accurate use of the pulse oximeter.;To learn and demonstrate proper purse lipped breathing techniques or other breathing techniques.;To learn and demonstrate proper use of respiratory medications    Long  Term Goals  Exhibits compliance with exercise, home and travel O2 prescription;Maintenance of O2 saturations>88%;Compliance with respiratory medication;Demonstrates proper use of MDI's;Exhibits proper breathing techniques, such as purse lipped breathing or other method taught during program session;Verbalizes importance of monitoring SPO2 with pulse oximeter and return demonstration       Oxygen Re-Evaluation: Oxygen Re-Evaluation    Row Name 03/07/17 1418 03/14/17 1025 03/26/17 1221 04/04/17 1224 04/30/17 1446     Program Oxygen Prescription   Program Oxygen Prescription  Continuous;E-Tanks  Continuous;E-Tanks  Continuous;Liquid Oxygen;E-Tanks  Continuous;E-Tanks;Liquid Oxygen  Continuous;E-Tanks   Liters per minute  6  6 10L walking  _0 Comments  -  Once pendant ordered, we will change to 6L for time  -  -  -     Home Oxygen   Home Oxygen Device  Home Concentrator;Liquid Oxygen  Home Concentrator;Liquid Oxygen  Home Concentrator;E-Tanks;Liquid Oxygen   Home Concentrator;Liquid Oxygen;E-Tanks  Home Concentrator;Liquid Oxygen;E-Tanks   Sleep Oxygen Prescription  CPAP  CPAP  CPAP  CPAP  CPAP   Liters per minute  _1 Home Exercise Oxygen Prescription  Pulsed  Continuous  Continuous  Continuous  Continuous   Liters per minute  4  6 pendant  _2 Home at Rest Exercise Oxygen Prescription  Continuous  Continuous  Continuous  Continuous  Continuous   Liters per minute  4  2 pendant  _3 Compliance with Home Oxygen Use  Yes  Yes  Yes  Yes  Yes     Goals/Expected Outcomes   Short Term Goals  To learn and exhibit compliance with exercise, home and travel O2 prescription;To learn and demonstrate proper use of respiratory medications;To learn and understand importance of maintaining oxygen saturations>88%;To learn and demonstrate proper pursed lip breathing techniques or other breathing techniques.;To learn and understand importance of monitoring SPO2 with pulse oximeter and demonstrate accurate use of the pulse oximeter.  To learn and exhibit compliance with exercise, home and travel O2 prescription  To learn and understand importance of maintaining oxygen saturations>88%;To learn and exhibit compliance with exercise, home and travel O2 prescription;To learn and demonstrate proper use of respiratory medications;To learn and demonstrate proper pursed lip breathing techniques or other breathing techniques.;To learn and understand importance of monitoring SPO2 with pulse oximeter and demonstrate accurate use of the pulse oximeter.  To learn and exhibit compliance with exercise, home and travel  O2 prescription;To learn and understand importance of maintaining oxygen saturations>88%;To learn and demonstrate proper use of respiratory medications;To learn and demonstrate proper pursed lip breathing techniques or other breathing techniques.;To learn and understand importance of monitoring SPO2 with pulse oximeter and demonstrate accurate use of  the pulse oximeter.  To learn and demonstrate proper use of respiratory medications;To learn and understand importance of maintaining oxygen saturations>88%;To learn and exhibit compliance with exercise, home and travel O2 prescription;To learn and demonstrate proper pursed lip breathing techniques or other breathing techniques.;To learn and understand importance of monitoring SPO2 with pulse oximeter and demonstrate accurate use of the pulse oximeter.   Long  Term Goals  Exhibits compliance with exercise, home and travel O2 prescription;Maintenance of O2 saturations>88%;Compliance with respiratory medication;Verbalizes importance of monitoring SPO2 with pulse oximeter and return demonstration;Exhibits proper breathing techniques, such as pursed lip breathing or other method taught during program session;Demonstrates proper use of MDI's  Exhibits compliance with exercise, home and travel O2 prescription  Verbalizes importance of monitoring SPO2 with pulse oximeter and return demonstration;Exhibits proper breathing techniques, such as pursed lip breathing or other method taught during program session;Demonstrates proper use of MDI's;Maintenance of O2 saturations>88%;Compliance with respiratory medication;Exhibits compliance with exercise, home and travel O2 prescription  Verbalizes importance of monitoring SPO2 with pulse oximeter and return demonstration;Exhibits proper breathing techniques, such as pursed lip breathing or other method taught during program session;Demonstrates proper use of MDI's;Compliance with respiratory medication;Maintenance of O2 saturations>88%;Exhibits compliance with exercise, home and travel O2 prescription  Verbalizes importance of monitoring SPO2 with pulse oximeter and return demonstration;Exhibits proper breathing techniques, such as pursed lip breathing or other method taught during program session;Demonstrates proper use of MDI's;Compliance with respiratory medication;Exhibits  compliance with exercise, home and travel O2 prescription;Maintenance of O2 saturations>88%   Comments  Darin Lopez has been compliant with his oxygen.  He is working with his doctor to get  a new portable concentrator to be able to use continous flow for exercise.  He does use his pulse oximeter at home to monitor his saturations.  Darin Lopez continues to gain proficiency at using PLB.  He continues to use his inhaler and has recently changed back to a different medication.  Previously, this med had given him a migraine, but so far so good.  He has not been using his spacer, but will look for it over the weekend.    Darin Lopez had his [place] appointment yesterday with pulmonary.  They increased his prescription see notes above.  He was ordered to start using a pendant or to increase liter flow to 10L on standard cannula.  Darin Lopez states he bought a portable oxygen system that holds his liquid tank he currently has. He states his one now is too heavy for him to carry. He wasnts to bring it in to make sure everything works correctly. Informed him that we can check his device but he should ask his home care provider if iit is appropriate.  Nada Boozer is checking checking his oxygen at home regularly. He is taking his medications as prescribed. He states his wife keeps him on track with taking his medications and wearing his oxygen. Nada Boozer has not been using PLB as much since he has not been feeling well. His protable oxygen machine is still giving him issues with it being so heavy  Darin Lopez has been doing very well in the program. He is using a spacer with his inhalers. Darin Lopez takes albuterol four times a day and symbicort. He is checking his oxygen  at home. Darin Lopez has a new portalbe tank that is alot lighter and makes him less short of breath the carry.   Goals/Expected Outcomes  Short: Find spacer to use with inhaler.  Long: Get new Marine scientist.   Short: Get outfitted for new concentrator and pendant, ask for pendant for use in rehab.  Long: Continued  complaince and improved saturations with exertion.   Short: Work on PLB. Long:Be proficient with PLB  Short: Work on Pursed lip breathing techniques. Long: Maintain oxygen staturations independently at home and use PLB   Short: Be compliant with his home oxygen. Long: maintain compliance with home oxygen.      Oxygen Discharge (Final Oxygen Re-Evaluation): Oxygen Re-Evaluation - 04/30/17 1446      Program Oxygen Prescription   Program Oxygen Prescription  Continuous;E-Tanks    Liters per minute  6      Home Oxygen   Home Oxygen Device  Home Concentrator;Liquid Oxygen;E-Tanks    Sleep Oxygen Prescription  CPAP    Liters per minute  4    Home Exercise Oxygen Prescription  Continuous    Liters per minute  6    Home at Rest Exercise Oxygen Prescription  Continuous    Liters per minute  6    Compliance with Home Oxygen Use  Yes      Goals/Expected Outcomes   Short Term Goals  To learn and demonstrate proper use of respiratory medications;To learn and understand importance of maintaining oxygen saturations>88%;To learn and exhibit compliance with exercise, home and travel O2 prescription;To learn and demonstrate proper pursed lip breathing techniques or other breathing techniques.;To learn and understand importance of monitoring SPO2 with pulse oximeter and demonstrate accurate use of the pulse oximeter.    Long  Term Goals  Verbalizes importance of monitoring SPO2 with pulse oximeter and return demonstration;Exhibits proper breathing techniques, such as pursed lip breathing or other method taught during program session;Demonstrates proper use of MDI's;Compliance with respiratory medication;Exhibits compliance with exercise, home and travel O2 prescription;Maintenance of O2 saturations>88%    Comments  Darin Lopez has been doing very well in the program. He is using a spacer with his inhalers. Darin Lopez takes albuterol four times a day and symbicort. He is checking his oxygen at home. Darin Lopez has a new portalbe  tank that is alot lighter and makes him less short of breath the carry.    Goals/Expected Outcomes  Short: Be compliant with his home oxygen. Long: maintain compliance with home oxygen.       Initial Exercise Prescription: Initial Exercise Prescription - 02/11/17 1600      Date of Initial Exercise RX and Referring Provider   Date  02/11/17    Referring Provider  [facility]      Oxygen   Oxygen  Continuous    Liters  6      Treadmill   MPH  2    Grade  0    Minutes  8 2 min intervals x4    METs  2.53      Arm Ergometer   Level  2    Watts  29    RPM  25    Minutes  15    METs  2      REL-XR   Level  1    Speed  50    Minutes  15    METs  2      Prescription Details   Frequency (times per week)  3    Duration  Progress to 45 minutes of aerobic exercise without signs/symptoms of physical distress      Intensity   THRR 40-80% of Max Heartrate  115-135    Ratings of Perceived Exertion  11-13    Perceived Dyspnea  0-4      Progression   Progression  Continue to progress workloads to maintain intensity without signs/symptoms of physical distress.      Resistance Training   Training Prescription  Yes    Weight  4 lbs    Reps  10-15       Perform Capillary Blood Glucose checks as needed.  Exercise Prescription Changes: Exercise Prescription Changes    Row Name 02/11/17 1600 02/26/17 1500 03/07/17 1400 03/11/17 1400 03/25/17 1500     Response to Exercise   Blood Pressure (Admit)  126/66  122/64  -  122/70  110/84   Blood Pressure (Exercise)  132/74  -  -  -  -   Blood Pressure (Exit)  126/56  124/62  -  118/62  100/74   Heart Rate (Admit)  95 bpm  69 bpm  -  85 bpm  74 bpm   Heart Rate (Exercise)  125 bpm  111 bpm  -  103 bpm  115 bpm   Heart Rate (Exit)  58 bpm  93 bpm  -  88 bpm  89 bpm   Oxygen Saturation (Admit)  92 %  96 %  -  93 %  91 %   Oxygen Saturation (Exercise)  78 %  88 %  -  84 %  88 %   Oxygen Saturation (Exit)  95 %  96 %  -  96 %  95 %    Rating of Perceived Exertion (Exercise)  15  15  -  15  17   Perceived Dyspnea (Exercise)  3.5  4  -  4  3   Symptoms  SOB  SOB  -  SOB  SON   Comments  walk test results  -  -  desaturates on treadmill  satuartions have improved   Duration  -  Progress to 45 minutes of aerobic exercise without signs/symptoms of physical distress  -  Continue with 45 min of aerobic exercise without signs/symptoms of physical distress.  Continue with 45 min of aerobic exercise without signs/symptoms of physical distress.   Intensity  -  THRR unchanged  -  THRR unchanged  THRR unchanged     Progression   Progression  -  Continue to progress workloads to maintain intensity without signs/symptoms of physical distress.  -  Continue to progress workloads to maintain intensity without signs/symptoms of physical distress.  Continue to progress workloads to maintain intensity without signs/symptoms of physical distress.   Average METs  -  2.64  -  2.64  2.48     Resistance Training   Training Prescription  -  Yes  -  Yes  Yes   Weight  -  4 lbs  -  4 lbs  4 lbs   Reps  -  10-15  -  10-15  10-15     Interval Training   Interval Training  -  No  -  No  No     Oxygen   Oxygen  -  Continuous  -  Continuous  Continuous   Liters  -  6  -  6  6 pendant     Treadmill   MPH  -  2  -  1.5  2   Grade  -  0  -  0  0   Minutes  -  15 still intermitten  -  15  15   METs  -  2.53  -  2.15  2.53     Arm Ergometer   Level  -  1  -  1  1   RPM  -  41  -  40  38   Minutes  -  15  -  15  15   METs  -  2.1  -  2.5  2.3     REL-XR   Level  -  1  -  1  1   Speed  -  48  -  43  -   Minutes  -  15  -  15  15   METs  -  3.3  -  3.5  2.6     Home Exercise Plan   Plans to continue exercise at  -  -  Home (comment) walking  Home (comment) walking  Home (comment) walking   Frequency  -  -  Add 1 additional day to program exercise sessions. once he gets new concentrator  Add 1 additional day to program exercise sessions. once he  gets new concentrator  Add 1 additional day to program exercise sessions. once he gets new concentrator   Initial Home Exercises Provided  -  -  03/07/17  03/07/17  03/07/17   Row Name 04/08/17 1600 04/23/17 1500 05/06/17 1500 05/19/17 1600       Response to Exercise   Blood Pressure (Admit)  150/60  130/68  132/70  118/58    Blood Pressure (Exit)  104/60  122/64  118/70  102/62    Heart Rate (Admit)  82 bpm  77 bpm  102 bpm  89 bpm    Heart Rate (Exercise)  118 bpm  110 bpm  113 bpm  111 bpm    Heart Rate (Exit)  96 bpm  98 bpm  92 bpm  96 bpm    Oxygen Saturation (Admit)  95 %  91 %  89 %  89 %    Oxygen Saturation (Exercise)  89 %  87 %  88 %  83 %    Oxygen Saturation (Exit)  97 %  94 %  93 %  94 %    Rating of Perceived Exertion (Exercise)  _0 Perceived Dyspnea (Exercise)  _1 Symptoms  SOB  SOB  SON  SOB    Comments  SOB on treadmill  SOB on treadmill  SOB on treadmill  SOB on treadmill    Duration  Continue with 45 min of aerobic exercise without signs/symptoms of physical distress.  Continue with 45 min of aerobic exercise without signs/symptoms of physical distress.  Continue with 45 min of aerobic exercise without signs/symptoms of physical distress.  Continue with 45 min of aerobic exercise without signs/symptoms of physical distress.    Intensity  THRR unchanged  THRR unchanged  THRR unchanged  THRR unchanged      Progression   Progression  Continue to progress workloads to maintain intensity without signs/symptoms of physical distress.  Continue to progress workloads to maintain intensity without signs/symptoms of physical distress.  Continue to progress workloads to maintain intensity without signs/symptoms of physical distress.  Continue to progress workloads to maintain  intensity without signs/symptoms of physical distress.    Average METs  2.71  2.91  3.04  2.78      Resistance Training   Training Prescription  Yes  Yes  Yes  Yes    Weight  4 lbs   5 lbs  5 lbs  5 lbs    Reps  10-15  10-15  10-15  10-15      Interval Training   Interval Training  No  No  No  No      Oxygen   Oxygen  Continuous  Continuous  Continuous  Continuous    Liters  6 pendant  6 pendant  6 pendant  6 pendant      Treadmill   MPH  _0 Grade  0  0  0  0    Minutes  _1 METs  2.53  2.53  2.53  2.53      Arm Ergometer   Level  _2 RPM  48  -  47  -    Minutes  _3 METs  2.5  2.3  2.5  2.3      REL-XR   Level  _4 Speed  50  -  -  -    Minutes  _5 METs  3.1  3.9  4.1  3.5      Home Exercise Plan   Plans to continue exercise at  Home (comment) walking  Home (comment) walking  Home (comment) walking  Home (comment) walking    Frequency  Add 1 additional day to program exercise sessions.  Add 1 additional day to program exercise sessions.  Add 1 additional day to program exercise sessions.  Add 2 additional days to program exercise sessions.    Initial Home Exercises Provided  03/07/17  03/07/17  03/07/17  03/07/17       Exercise Comments: Exercise Comments    Row Name 02/11/17 1617 02/19/17 1127 05/21/17 1127       Exercise Comments  Also completed walk test on 6L oxygen.  Test stopped at 3:15 when saturations dropped to 83%.  1 min 93%, 2 min 87%, 3 min 83%, 4 min 94% (seated).  We will start his exercise as no continuous.  He will start with 2 min intervals especially on the treadmill.  We will see how he is able to handle seated exercise to determine if intermitten exercise is needed on there as well.   First full day of exercise!  Patient was oriented to gym and equipment including functions, settings, policies, and procedures.  Patient's individual exercise prescription and treatment plan were reviewed.  All starting workloads were established based on the results of the 6 minute walk test done at initial orientation visit.  The plan for exercise progression was also introduced  and progression will be customized based on patient's performance and goals.  Deven graduated today from cardiac rehab with 36/36 sessions completed.  Details of the patient's exercise prescription and what He needs to do in order to continue the prescription and progress were discussed with patient.  Patient was given a copy of prescription and goals.  Patient verbalized understanding.  Tyler plans to continue to exercise  by walking at home.        Exercise Goals and Review: Exercise Goals    Row Name 02/11/17 1623             Exercise Goals   Increase Physical Activity  Yes       Intervention  Provide advice, education, support and counseling about physical activity/exercise needs.;Develop an individualized exercise prescription for aerobic and resistive training based on initial evaluation findings, risk stratification, comorbidities and participant's personal goals.       Expected Outcomes  Achievement of increased cardiorespiratory fitness and enhanced flexibility, muscular endurance and strength shown through measurements of functional capacity and personal statement of participant.       Increase Strength and Stamina  Yes       Intervention  Provide advice, education, support and counseling about physical activity/exercise needs.;Develop an individualized exercise prescription for aerobic and resistive training based on initial evaluation findings, risk stratification, comorbidities and participant's personal goals.       Expected Outcomes  Achievement of increased cardiorespiratory fitness and enhanced flexibility, muscular endurance and strength shown through measurements of functional capacity and personal statement of participant.          Exercise Goals Re-Evaluation : Exercise Goals Re-Evaluation    Row Name 02/26/17 1525 03/07/17 1246 03/11/17 1456 03/25/17 1502 04/08/17 1513     Exercise Goal Re-Evaluation   Exercise Goals Review  Increase Physical Activity;Increase  Strength and Stamina  Increase Physical Activity;Increase Strength and Stamina;Understanding of Exercise Prescription;Knowledge and understanding of Target Heart Rate Range (THRR);Able to understand and use rate of perceived exertion (RPE) scale;Able to understand and use Dyspnea scale;Able to check pulse independently  Increase Physical Activity;Increase Strength and Stamina  Increase Physical Activity;Increase Strength and Stamina  Increase Physical Activity;Increase Strength and Stamina   Comments  Darin Lopez is off to a good start in rehab.  He is limited in activity by his oxygen saturations.  When he is here on 6L, he does well, but just coming in on only 3-4L pulsed wears him out before he even gets started with exercsie.  He is up to 15 min continuous on both seated pieces of equipment.  We will continue to moniotor his progresion.   Reviewed home exercise with pt today.  Pt plans to walk at home for exercise once he gets his new concentrator.  Reviewed THR, pulse, RPE, sign and symptoms and when to call 911 or MD.  Also discussed weather considerations and indoor options.  Pt voiced understanding.  Darin Lopez continues to do well in rehab.  He continues to desaturate when walking even with 6L of oxygen.  We continue to await a new concentrator for home use.  He is up to 3.5 METs on the XR.  We will continue to monitor his progression.  Darin Lopez has been doing well in rehab.  He sats have been better since using the pendant.  He is now up to 2.0 mph on the treadmill for 15 min!!  We will continue to monitor his progress.   Darin Lopez continues to do well in rehab.  He is going to bring a concentrator in that he picked up at a flea market to test with exercise tomorrow.  He has moved up to level 3 on the XR.  We will continue to monitor his progression.    Expected Outcomes  Short: Darin Lopez will talk to his doctor about increasing his O2 prescription and then we can discuss home exercise.  Long: Continue to  build strength and stamina.   Short: Darin Lopez will start to add in exercise once he gets a new portable concentrator that supports continuous flow.  Long: Continue to build strength and stamina.   Short: Increase workloads on seated equipment.  Long: Continue to build stamina on treadmill.  Short: Continue to try to increase workloads.   Long: Continue to work on Animator.   Short: Increase workload on arm crank and look at concentrator.  Long: Continue to increase physical activity.    [place] Name 04/09/17 1309 04/23/17 1520 05/06/17 1501 05/19/17 1622       Exercise Goal Re-Evaluation   Exercise Goals Review  Understanding of Exercise Prescription  Increase Strength and Stamina;Increase Physical Activity  Increase Strength and Stamina;Increase Physical Activity;Understanding of Exercise Prescription  Increase Strength and Stamina;Increase Physical Activity;Understanding of Exercise Prescription    Comments  Darin Lopez walked with his portable tank on the treadmill while checking his oxygen he stayed around 91-92 percent. He should be able to use for home exercise.  Darin Lopez has been doing well in rehab.  He is doing better on the treadmill and maintain his oxygen saturations around 87-88% for the full 15 min.  We will start looking at increasing his other workloads more as well.   Darin Lopez continues to do well in rehab.  He is already starting to near graduation.  He is using his portable concentrator to walk some at home.  He continues to stay right around 88% when walking on 6L.  His RPE's have continued to be high on the other pieces of equipment.  We will continue to monitor his progress.   Darin Lopez will be graduating on Wednesday!!  He is planning to continue to walk at home.     Expected Outcomes  Short: use portable tank for home exercise. Long: Continue to exercise independently on portable tank  Short: Start to increase more workloads.  Long: Continue to exercise more independently.  Short: Try to increase workloads despite the high  RPE.  Long: Continue to walk more at home.   Short: Graduation  Long: Continue to walk at home.        Discharge Exercise Prescription (Final Exercise Prescription Changes): Exercise Prescription Changes - 05/19/17 1600      Response to Exercise   Blood Pressure (Admit)  118/58    Blood Pressure (Exit)  102/62    Heart Rate (Admit)  89 bpm    Heart Rate (Exercise)  111 bpm    Heart Rate (Exit)  96 bpm    Oxygen Saturation (Admit)  89 %    Oxygen Saturation (Exercise)  83 %    Oxygen Saturation (Exit)  94 %    Rating of Perceived Exertion (Exercise)  15    Perceived Dyspnea (Exercise)  4    Symptoms  SOB    Comments  SOB on treadmill    Duration  Continue with 45 min of aerobic exercise without signs/symptoms of physical distress.    Intensity  THRR unchanged      Progression   Progression  Continue to progress workloads to maintain intensity without signs/symptoms of physical distress.    Average METs  2.78      Resistance Training   Training Prescription  Yes    Weight  5 lbs    Reps  10-15      Interval Training   Interval Training  No      Oxygen   Oxygen  Continuous  Liters  6 pendant      Treadmill   MPH  2    Grade  0    Minutes  15    METs  2.53      Arm Ergometer   Level  1    Minutes  15    METs  2.3      REL-XR   Level  3    Minutes  15    METs  3.5      Home Exercise Plan   Plans to continue exercise at  Home (comment) walking    Frequency  Add 2 additional days to program exercise sessions.    Initial Home Exercises Provided  03/07/17       Nutrition:  Target Goals: Understanding of nutrition guidelines, daily intake of sodium <1541m, cholesterol <2062m calories 30% from fat and 7% or less from saturated fats, daily to have 5 or more servings of fruits and vegetables.  Biometrics: Pre Biometrics - 02/11/17 1623      Pre Biometrics   Height  6' 0.4" (1.839 m)    Weight  202 lb 4.8 oz (91.8 kg)    Waist Circumference  42.5 inches     Hip Circumference  39 inches    Waist to Hip Ratio  1.09 %    BMI (Calculated)  27.2        Nutrition Therapy Plan and Nutrition Goals: Nutrition Therapy & Goals - 04/30/17 1503      Nutrition Therapy   RD appointment defered  Yes      Personal Nutrition Goals   Comments  KeYvone Neueclines meeting with the dietician.       Nutrition Discharge: Rate Your Plate Scores:   Nutrition Goals Re-Evaluation: Nutrition Goals Re-Evaluation    Row Name 03/07/17 1409 04/04/17 1232 04/30/17 1504         Goals   Current Weight  -  203 lb (92.1 kg)  201 lb (91.2 kg)     Nutrition Goal  Healthy Diet  Eat as healthy as he can  Lose a couple pounds and maintain weight.     Comment  KeYvone Neuontinues to defer a dietician appointment.  He has been eating well, but admits to cheating on occasion wiht country ham and fried shrimp.  He would like to eat more red meat but know that it is not good for him.  FrManus Gunningelp him keep an eye on his diet and making sure that he eats good.  KeYvone Neuefers the dietician appointment. KeAzalee Coursee has been eating well as of late except for yesterday.   KeYvone Neuoes not want to lose much weight at this point. He feels like his weight is healthy. He states if he loses a few thats ok. KeYvone Neuays eats well and will cut back if he gains any weight.     Expected Outcome  Short: Try not to cheat as much on diet.  Long: Continue healty eating  Short: lose weight by eating healthy. Long: Maintain eating healthy post [facility].  Short: eat smaller portions to lose weight. Long: Maintain a healthy weight.        Nutrition Goals Discharge (Final Nutrition Goals Re-Evaluation): Nutrition Goals Re-Evaluation - 04/30/17 1504      Goals   Current Weight  201 lb (91.2 kg)    Nutrition Goal  Lose a couple pounds and maintain weight.    Comment  KeYvone Neuoes not want to lose much weight at this  point. He feels like his weight is healthy. He states if he loses a few thats ok. Darin Lopez says eats well and will  cut back if he gains any weight.    Expected Outcome  Short: eat smaller portions to lose weight. Long: Maintain a healthy weight.       Psychosocial: Target Goals: Acknowledge presence or absence of significant depression and/or stress, maximize coping skills, provide positive support system. Participant is able to verbalize types and ability to use techniques and skills needed for reducing stress and depression.   Initial Review & Psychosocial Screening: Initial Psych Review & Screening - 02/11/17 1446      Initial Review   Current issues with  None Identified      Family Dynamics   Good Support System?  Yes      Barriers   Psychosocial barriers to participate in program  There are no identifiable barriers or psychosocial needs.      Screening Interventions   Interventions  Encouraged to exercise;To provide support and resources with identified psychosocial needs;Yes    Expected Outcomes  Short Term goal: Utilizing psychosocial counselor, staff and physician to assist with identification of specific Stressors or current issues interfering with healing process. Setting desired goal for each stressor or current issue identified.;Long Term Goal: Stressors or current issues are controlled or eliminated.;Short Term goal: Identification and review with participant of any Quality of Life or Depression concerns found by scoring the questionnaire.;Long Term goal: The participant improves quality of Life and PHQ9 Scores as seen by post scores and/or verbalization of changes       Quality of Life Scores:   PHQ-9: Recent Review Flowsheet Data    Depression screen [facility] 2/9 05/12/2017 03/26/2017 02/11/2017   Decreased Interest _0 Down, Depressed, Hopeless _1 PHQ - 2 Score _2 Altered sleeping 3 0 0   Tired, decreased energy _3 Change in appetite 2 2 0   Feeling bad or failure about yourself  0 1 0   Trouble concentrating 0 0 0   Moving slowly or fidgety/restless 2 0 0    Suicidal thoughts 0 0 0   PHQ-9 Score _4 Difficult doing work/chores Somewhat difficult Somewhat difficult Somewhat difficult     Interpretation of Total Score  Total Score Depression Severity:  1-4 = Minimal depression, 5-9 = Mild depression, 10-14 = Moderate depression, 15-19 = Moderately severe depression, 20-27 = Severe depression   Psychosocial Evaluation and Intervention: Psychosocial Evaluation - 05/19/17 1230      Discharge Psychosocial Assessment & Intervention   Comments  Counselor met with Ken's partner Manus Gunning) today as part of his discharge evaluation.   She reports Darin Lopez has benefitted from this class mostly socially and emotionally, but she hasn't noticed much difference with his pulmonary status.  Counselor met with her due to some concern about stress in providing constant care for [place].  She reports he is extremely dependent on her and it is becoming increasingly more difficult for her.  She plans to attend a Hospice support group for caregivers beginning in january, 2019 to help her cope better with this role and provide some self-care.  Counselor encouraged improved self-care in other ways and brainstormed with her about these.  She agreed this is needed and she is ready.  Darin Lopez will graduate from this program this week and is breathing better due to use of a  portable E-cylindar.  He is stable at this time and that is progress.         Psychosocial Re-Evaluation: Psychosocial Re-Evaluation    [place] Name 03/07/17 1410 04/04/17 1237           Psychosocial Re-Evaluation   Current issues with  None Identified  Current Stress Concerns      Comments  Darin Lopez continues to maintain a positive attitude.  His biggest stressor is just that as he gets older his health is not doing well.  He would like to be able to breathe better.  They are working on getting him a Armed forces operational officer that should hlep.  He says that having Manus Gunning for his support has been the best thing.  He has  enjoyed coming to rehab and enjoys the exercise and the people he gets to meet and interact with while here.   Darin Lopez is stressed with his oxygen tank being heavy. He does not want to be seen in public rolling an oxygen tank arooung so he deals with it. Informed him that with his COPD he needs to use something easier to carry so he does not get so short of breath. Explained to him that he is not alone in his endeavor.       Expected Outcomes  Short: Continue to exercise and come to class for moral support.  Long: Maintain postive attitude  Short:get a lighter oxygen tank to boost moral. Long: Understand his disease process and how to maintain it with minimal stress      Interventions  Encouraged to attend Pulmonary Rehabilitation for the exercise;Stress management education;Relaxation education  Encouraged to attend Pulmonary Rehabilitation for the exercise;Stress management education;Relaxation education      Continue Psychosocial Services   Follow up required by staff  Follow up required by staff         Psychosocial Discharge (Final Psychosocial Re-Evaluation): Psychosocial Re-Evaluation - 04/04/17 1237      Psychosocial Re-Evaluation   Current issues with  Current Stress Concerns    Comments  Darin Lopez is stressed with his oxygen tank being heavy. He does not want to be seen in public rolling an oxygen tank arooung so he deals with it. Informed him that with his COPD he needs to use something easier to carry so he does not get so short of breath. Explained to him that he is not alone in his endeavor.     Expected Outcomes  Short:get a lighter oxygen tank to boost moral. Long: Understand his disease process and how to maintain it with minimal stress    Interventions  Encouraged to attend Pulmonary Rehabilitation for the exercise;Stress management education;Relaxation education    Continue Psychosocial Services   Follow up required by staff       Education: Education Goals: Education classes will be  provided on a weekly basis, covering required topics. Participant will state understanding/return demonstration of topics presented.  Learning Barriers/Preferences: Learning Barriers/Preferences - 02/11/17 1510      Learning Barriers/Preferences   Learning Barriers  Sight    Learning Preferences  Individual Instruction;Skilled Demonstration;Verbal Instruction       Education Topics: Initial Evaluation Education: - Verbal, written and demonstration of respiratory meds, RPE/PD scales, oximetry and breathing techniques. Instruction on use of nebulizers and MDIs: cleaning and proper use, rinsing mouth with steroid doses and importance of monitoring MDI activations.   Pulmonary Rehab from 05/21/2017 in [facility] Cardiac and Pulmonary Rehab  Date  02/11/17  Educator  [facility]  Instruction Review Code (retired)  2- meets goals/outcomes  Instruction Review Code  1- IT trainer Nutrition Guidelines/Fats and Fiber: -Group instruction provided by verbal, written material, models and posters to present the general guidelines for heart healthy nutrition. Gives an explanation and review of dietary fats and fiber.   Pulmonary Rehab from 05/21/2017 in [facility] Cardiac and Pulmonary Rehab  Date  04/14/17  Educator  CR  Instruction Review Code  1- Verbalizes Understanding      Controlling Sodium/Reading Food Labels: -Group verbal and written material supporting the discussion of sodium use in heart healthy nutrition. Review and explanation with models, verbal and written materials for utilization of the food label.   Pulmonary Rehab from 05/21/2017 in [facility] Cardiac and Pulmonary Rehab  Date  04/21/17  Educator  CR  Instruction Review Code  1- Verbalizes Understanding      Exercise Physiology & Risk Factors: - Group verbal and written instruction with models to review the exercise physiology of the cardiovascular system and associated critical values. Details cardiovascular disease  risk factors and the goals associated with each risk factor.   Pulmonary Rehab from 05/21/2017 in [facility] Cardiac and Pulmonary Rehab  Date  04/04/17  Educator  [facility]  Instruction Review Code  1- Verbalizes Understanding      Aerobic Exercise & Resistance Training: - Gives group verbal and written discussion on the health impact of inactivity. On the components of aerobic and resistive training programs and the benefits of this training and how to safely progress through these programs.   Pulmonary Rehab from 05/21/2017 in [facility] Cardiac and Pulmonary Rehab  Date  05/02/17  Educator  [facility]  Instruction Review Code  1- Verbalizes Understanding      Flexibility, Balance, General Exercise Guidelines: - Provides group verbal and written instruction on the benefits of flexibility and balance training programs. Provides general exercise guidelines with specific guidelines to those with heart or lung disease. Demonstration and skill practice provided.   Pulmonary Rehab from 05/21/2017 in [facility] Cardiac and Pulmonary Rehab  Date  05/21/17  Educator  AS  Instruction Review Code  2- Demonstrated Understanding      Stress Management: - Provides group verbal and written instruction about the health risks of elevated stress, cause of high stress, and healthy ways to reduce stress.   Pulmonary Rehab from 05/21/2017 in [facility] Cardiac and Pulmonary Rehab  Date  03/19/17  Educator  [facility]  Instruction Review Code  1- Verbalizes Understanding      Depression: - Provides group verbal and written instruction on the correlation between heart/lung disease and depressed mood, treatment options, and the stigmas associated with seeking treatment.   Pulmonary Rehab from 05/21/2017 in [facility] Cardiac and Pulmonary Rehab  Date  05/14/17  Educator  [facility]  Instruction Review Code  1- Verbalizes Understanding      Exercise & Equipment Safety: - Individual verbal instruction and demonstration of equipment use and safety with  use of the equipment.   Pulmonary Rehab from 05/21/2017 in [facility] Cardiac and Pulmonary Rehab  Date  02/11/17  Educator  [facility]  Instruction Review Code  1- Verbalizes Understanding      Infection Prevention: - Provides verbal and written material to individual with discussion of infection control including proper hand washing and proper equipment cleaning during exercise session.   Pulmonary Rehab from 05/21/2017 in [facility] Cardiac and Pulmonary Rehab  Date  02/11/17  Educator  [facility]  Instruction Review Code  2- Demonstrated  Understanding      Falls Prevention: - Provides verbal and written material to individual with discussion of falls prevention and safety.   Pulmonary Rehab from 05/21/2017 in [facility] Cardiac and Pulmonary Rehab  Date  02/11/17  Educator  [facility]  Instruction Review Code (retired)  2- meets goals/outcomes  Instruction Review Code  1- Science writer Understanding      Diabetes: - Individual verbal and written instruction to review signs/symptoms of diabetes, desired ranges of glucose level fasting, after meals and with exercise. Advice that pre and post exercise glucose checks will be done for 3 sessions at entry of program.   Chronic Lung Diseases: - Group verbal and written instruction to review new updates, new respiratory medications, new advancements in procedures and treatments. Provide informative websites and "800" numbers of self-education.   Lung Procedures: - Group verbal and written instruction to describe testing methods done to diagnose lung disease. Review the outcome of test results. Describe the treatment choices: Pulmonary Function Tests, ABGs and oximetry.   Energy Conservation: - Provide group verbal and written instruction for methods to conserve energy, plan and organize activities. Instruct on pacing techniques, use of adaptive equipment and posture/positioning to relieve shortness of breath.   Pulmonary Rehab from 05/21/2017 in [facility] Cardiac and Pulmonary  Rehab  Date  03/12/17  Educator  [facility]  Instruction Review Code  1- Verbalizes Understanding      Triggers: - Group verbal and written instruction to review types of environmental controls: home humidity, furnaces, filters, dust mite/pet prevention, HEPA vacuums. To discuss weather changes, air quality and the benefits of nasal washing.   Pulmonary Rehab from 05/21/2017 in [facility] Cardiac and Pulmonary Rehab  Date  05/07/17  Educator  [facility]  Instruction Review Code  1- Verbalizes Understanding      Exacerbations: - Group verbal and written instruction to provide: warning signs, infection symptoms, calling MD promptly, preventive modes, and value of vaccinations. Review: effective airway clearance, coughing and/or vibration techniques. Create an Sports administrator.   Pulmonary Rehab from 05/21/2017 in [facility] Cardiac and Pulmonary Rehab  Date  05/07/17  Educator  [facility]  Instruction Review Code  1- Verbalizes Understanding      Oxygen: - Individual and group verbal and written instruction on oxygen therapy. Includes supplement oxygen, available portable oxygen systems, continuous and intermittent flow rates, oxygen safety, concentrators, and Medicare reimbursement for oxygen.   Respiratory Medications: - Group verbal and written instruction to review medications for lung disease. Drug class, frequency, complications, importance of spacers, rinsing mouth after steroid MDI's, and proper cleaning methods for nebulizers.   AED/CPR: - Group verbal and written instruction with the use of models to demonstrate the basic use of the AED with the basic ABC's of resuscitation.   Pulmonary Rehab from 05/21/2017 in [facility] Cardiac and Pulmonary Rehab  Date  03/21/17  Educator  CE  Instruction Review Code  1- Verbalizes Understanding      Breathing Retraining: - Provides individuals verbal and written instruction on purpose, frequency, and proper technique of diaphragmatic breathing and pursed-lipped breathing.  Applies individual practice skills.   Anatomy and Physiology of the Lungs: - Group verbal and written instruction with the use of models to provide basic lung anatomy and physiology related to function, structure and complications of lung disease.   Anatomy & Physiology of the Heart: - Group verbal and written instruction and models provide basic cardiac anatomy and physiology, with the coronary electrical and arterial systems. Review of: AMI, Angina, Valve disease, Heart  Failure, Cardiac Arrhythmia, Pacemakers, and the ICD.   Pulmonary Rehab from 05/21/2017 in [facility] Cardiac and Pulmonary Rehab  Date  05/16/17  Educator  [place]  Instruction Review Code  1- Verbalizes Understanding      Heart Failure: - Group verbal and written instruction on the basics of heart failure: signs/symptoms, treatments, explanation of ejection fraction, enlarged heart and cardiomyopathy.   Pulmonary Rehab from 05/21/2017 in [facility] Cardiac and Pulmonary Rehab  Date  05/16/17  Educator  [place]  Instruction Review Code  1- Verbalizes Understanding      Sleep Apnea: - Individual verbal and written instruction to review Obstructive Sleep Apnea. Review of risk factors, methods for diagnosing and types of masks and machines for OSA.   Pulmonary Rehab from 05/21/2017 in [facility] Cardiac and Pulmonary Rehab  Date  05/07/17  Educator  [facility]  Instruction Review Code  1- Verbalizes Understanding      Anxiety: - Provides group, verbal and written instruction on the correlation between heart/lung disease and anxiety, treatment options, and management of anxiety.   Pulmonary Rehab from 05/21/2017 in [facility] Cardiac and Pulmonary Rehab  Date  03/19/17  Educator  [facility]  Instruction Review Code  1- Verbalizes Understanding      Relaxation: - Provides group, verbal and written instruction about the benefits of relaxation for patients with heart/lung disease. Also provides patients with examples of relaxation techniques.    Pulmonary Rehab from 05/21/2017 in [facility] Cardiac and Pulmonary Rehab  Date  04/16/17  Educator  [facility]  Instruction Review Code  1- Verbalizes Understanding      Cardiac Medications: - Group verbal and written instruction to review commonly prescribed medications for heart disease. Reviews the medication, class of the drug, and side effects.   Pulmonary Rehab from 05/21/2017 in [facility] Cardiac and Pulmonary Rehab  Date  03/07/17  Educator  CE  Instruction Review Code  1- Verbalizes Understanding      Know Your Numbers: -Group verbal and written instruction about important numbers in your health.  Review of Cholesterol, Blood Pressure, Diabetes, and BMI and the role they play in your overall health.   Pulmonary Rehab from 05/21/2017 in [facility] Cardiac and Pulmonary Rehab  Date  04/11/17  Educator  [facility]  Instruction Review Code  1- Verbalizes Understanding      Other: -Provides group and verbal instruction on various topics (see comments)    Knowledge Questionnaire Score: Knowledge Questionnaire Score - 05/12/17 1149      Knowledge Questionnaire Score   Pre Score  7/10    Post Score  8/10 reviewed with patient        Core Components/Risk Factors/Patient Goals at Admission: Personal Goals and Risk Factors at Admission - 02/11/17 1515      Core Components/Risk Factors/Patient Goals on Admission    Weight Management  Yes;Weight Loss    Intervention  Weight Management: Develop a combined nutrition and exercise program designed to reach desired caloric intake, while maintaining appropriate intake of nutrient and fiber, sodium and fats, and appropriate energy expenditure required for the weight goal.;Weight Management: Provide education and appropriate resources to help participant work on and attain dietary goals.;Weight Management/Obesity: Establish reasonable short term and long term weight goals.    Admit Weight  202 lb 4.8 oz (91.8 kg)    Goal Weight: Short Term  197 lb (89.4 kg)     Goal Weight: Long Term  192 lb (87.1 kg)    Expected Outcomes  Short Term: Continue to assess and  modify interventions until short term weight is achieved;Long Term: Adherence to nutrition and physical activity/exercise program aimed toward attainment of established weight goal;Weight Loss: Understanding of general recommendations for a balanced deficit meal plan, which promotes 1-2 lb weight loss per week and includes a negative energy balance of [phone] kcal/d;Understanding recommendations for meals to include 15-35% energy as protein, 25-35% energy from fat, 35-60% energy from carbohydrates, less than 275m of dietary cholesterol, 20-35 gm of total fiber daily;Understanding of distribution of calorie intake throughout the day with the consumption of 4-5 meals/snacks    Hypertension  Yes    Intervention  Provide education on lifestyle modifcations including regular physical activity/exercise, weight management, moderate sodium restriction and increased consumption of fresh fruit, vegetables, and low fat dairy, alcohol moderation, and smoking cessation.;Monitor prescription use compliance.    Expected Outcomes  Short Term: Continued assessment and intervention until BP is < 140/977mHG in hypertensive participants. < 130/8052mG in hypertensive participants with diabetes, heart failure or chronic kidney disease.;Long Term: Maintenance of blood pressure at goal levels.    Lipids  Yes    Intervention  Provide education and support for participant on nutrition & aerobic/resistive exercise along with prescribed medications to achieve LDL <43m26mDL >40mg53m Expected Outcomes  Short Term: Participant states understanding of desired cholesterol values and is compliant with medications prescribed. Participant is following exercise prescription and nutrition guidelines.;Long Term: Cholesterol controlled with medications as prescribed, with individualized exercise RX and with personalized nutrition plan. Value  goals: LDL < 43mg,31m > 40 mg.       Core Components/Risk Factors/Patient Goals Review:  Goals and Risk Factor Review    Row Name 03/07/17 1250 04/04/17 1220 04/30/17 1452         Core Components/Risk Factors/Patient Goals Review   Personal Goals Review  Weight Management/Obesity;Hypertension;Lipids;Improve shortness of breath with ADL's  Weight Management/Obesity;Hypertension;Stress;Lipids  Weight Management/Obesity;Hypertension;Lipids;Stress     Review  Ken haYvone Neueen doing well in rehab.  His weight has been steady since starting the program.  Ken's blood pressures have been good and he continues to monitor them at home. He has not had any problems with his medications and lipids seem to be doing well.   Ken's biggest problems continues to be his shortness of breath and being able to do stuff around the house.    Ken haYvone Neuot had his lipids checked recently. His weight has not changed much since  the start of the program. His blood pressure has been under control while attending class and he has been checking his at home. He states that his reading are comparable to ours. Stress levels have been the same.  Ken haYvone Neueen maintaining his weight. He currently does not want to lose weight. His blood pressure has been under control along with his lipids. His stressors are his shortness of breath at times.     Expected Outcomes  Short: Continue to work on improving his SOB.  Long: Continue to work on risk factor modification  Short: Work on decreasing stres levels by exercising. Long: Maintain exercise to maintain a stress free environment.  Short: attend [facility] regularly to maintain weight. Long: post [facility] maintain a workout routine.        Core Components/Risk Factors/Patient Goals at Discharge (Final Review):  Goals and Risk Factor Review - 04/30/17 1452      Core Components/Risk Factors/Patient Goals Review   Personal Goals Review  Weight Management/Obesity;Hypertension;Lipids;Stress     Review  Darin Lopez has been maintaining his weight. He currently does not want to lose weight. His blood pressure has been under control along with his lipids. His stressors are his shortness of breath at times.    Expected Outcomes  Short: attend [facility] regularly to maintain weight. Long: post [facility] maintain a workout routine.       ITP Comments: ITP Comments    Row Name 02/11/17 1651 02/21/17 1152 02/24/17 0841 02/26/17 1525 02/28/17 1233   ITP Comments  Medical Evaluation Completed. Chart sent for signature and review to Dr. Emily Filbert Director of [place]. Visit diagnosis can be found in [facility] encounter 02/11/17  Patient came in today with spo2 in the low 80's. Informed patient that he may need 4 liters continuous instead of pulsed oxygen. Worked with patient on PLB techniques. He recovers when he rest but requires 6 liters when exercising.  30 day review completed. ITP sent to Dr. Emily Filbert Director of [place]. Continue with ITP unless changes are made by physician.    Darin Lopez came in today at 83% on his home tank after walking in from the parking lot.  He refuses to use valet or to be dropped off at the curb by his wife.  He needs a portable tank or concentrator that can handle 6L continuous flow like he is on in rehab.  We talked about the importance of discussing with his doctor tomorrow at his pulmonolgy appointment.  He and his wife voiced understanding and will bring it up at their appointment.   Darin Lopez went to [place] yesterday for pulmonary appointment.  He was approved to go on continuous flow for exercise at home.  He was told he will need a few more test before they get the right equipment for him.  We will wait until he has portable continuous flow before initiating home exercise.    Row Name 03/24/17 [phone] 03/26/17 1050 04/21/17 0836 05/19/17 0857 05/21/17 1127   ITP Comments  30 day review completed. ITP sent to Dr. Emily Filbert Director of [place]. Continue with ITP unless changes are made by  physician.    Kens met with home health Nurse for changes to his exercise oxygen prescription. 1 liter on his CPAP, 2 Liters at rest, 6 Liters for Exercise.  30 day review completed. ITP sent to Dr. Emily Filbert Director of [place]. Continue with ITP unless changes are made by physician.    30 day review completed. ITP sent to Dr. Emily Filbert Director of [place]. Continue with ITP unless changes are made by physician.    Darin Lopez graduated today.  ITP created and sent to Dr. Sabra Heck.  Also sent to [facility].      Comments: Discharge ITP

## 2017-05-21 NOTE — Progress Notes (Signed)
Daily Session Note  Patient Details  Name: Darin Lopez MRN: [id number] Date of Birth: 06/04/1941 Referring Provider:     Pulmonary Rehab from 02/11/2017 in [place] and Pulmonary Rehab  Referring Provider  [facility]      Encounter Date: 05/21/2017  Check In: Session Check In - 05/21/17 1126      Check-In   Location  [facility]-Cardiac & Pulmonary Rehab    Staff Present  Renita Papa, RN BSN;Domanique Luckett Darrin Nipper, [place], ACSM RCEP, Exercise Physiologist    Supervising physician immediately available to respond to emergencies  [facility] immediately available ER MD    Physician(s)  Drs. Alfred Levins and Archie Balboa    Medication changes reported      No    Fall or balance concerns reported     No    Warm-up and Cool-down  Performed as group-led Higher education careers adviser Performed  Yes    VAD Patient?  No      Pain Assessment   Currently in Pain?  No/denies          Social History   Tobacco Use  Smoking Status Former Smoker  . Packs/day: 3.00  . Years: 30.00  . Pack years: 90.00  . Types: Cigarettes  Smokeless Tobacco Never Used  Tobacco Comment   Quit over 35 yrs ago    Goals Met:  Proper associated with RPD/PD & O2 Sat [place] with exercise equipment Using PLB without cueing & demonstrates good technique Exercise tolerated well Personal goals reviewed No report of cardiac concerns or symptoms Strength training completed today  Goals Unmet:  Not Applicable  Comments:  Romulus graduated today from cardiac rehab with 36/36 sessions completed.  Details of the patient's exercise prescription and what He needs to do in order to continue the prescription and progress were discussed with patient.  Patient was given a copy of prescription and goals.  Patient verbalized understanding.  Luman plans to continue to exercise by walking at home.    Dr. Emily Filbert is Medical Director for [place] and [facility]  Pulmonary Rehabilitation.

## 2017-05-23 IMAGING — CR DG CHEST 2V
2 series · 2 of 2 positions shown · non-contrast
Comparison: 08/11/2013

CLINICAL DATA: Short of breath and chest pain.  Cough

EXAM:
CHEST  2 VIEW

[chest pa]
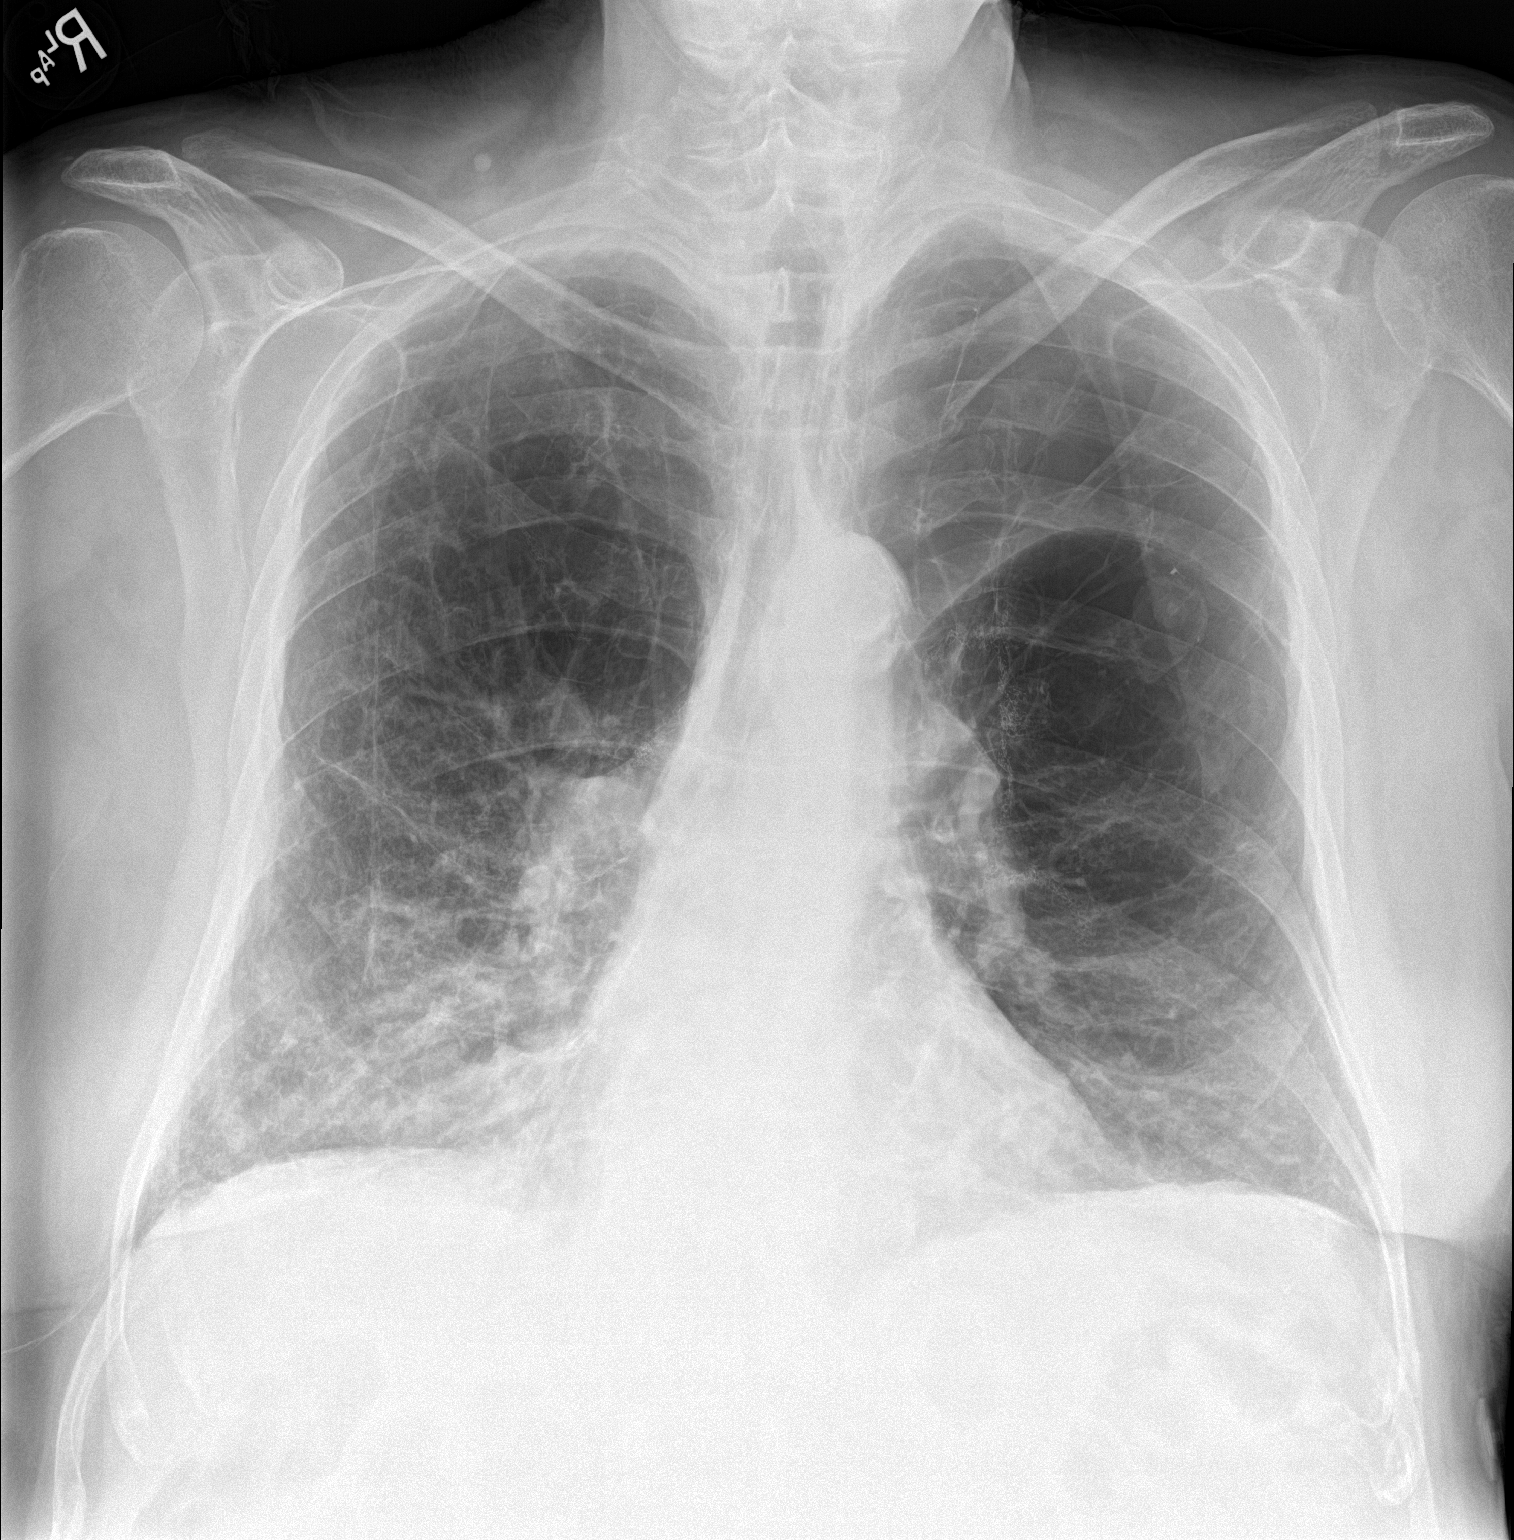

[chest lat]
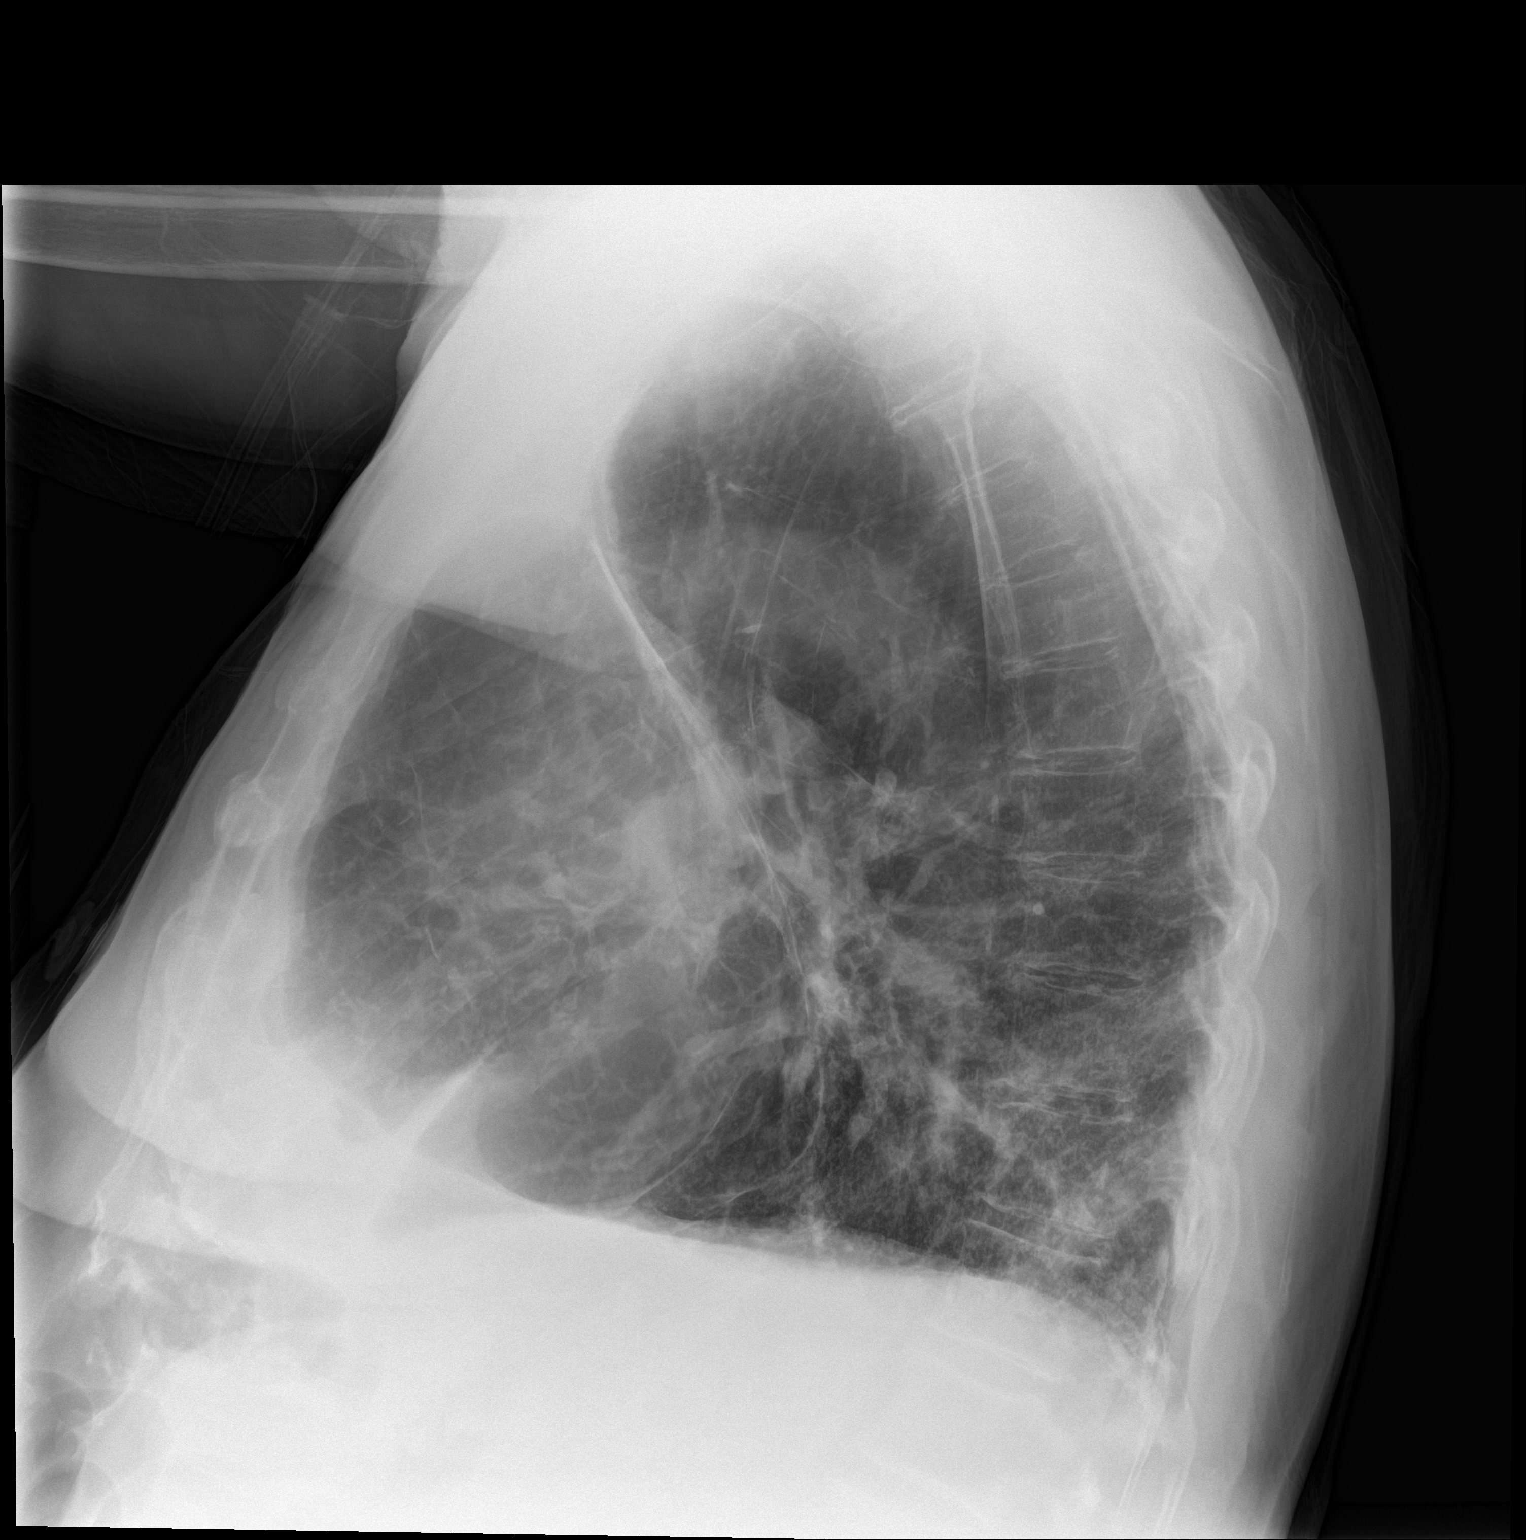

[2 of 2 positions shown; findings below may reference images not displayed]

FINDINGS: Severe COPD with apical emphysema and hyperinflation. Prior upper
lobectomy bilaterally with clips noted.

Right lower lobe infiltrate is a new finding and consistent with
pneumonia. No effusion. Mild scarring in both lung bases. Right
middle lobe scarring.
IMPRESSION: Right lower lobe pneumonia.

Severe COPD with apical emphysema bilaterally.

## 2017-06-12 DIAGNOSIS — K5100 Ulcerative (chronic) pancolitis without complications: Secondary | ICD-10-CM | POA: Diagnosis not present

## 2017-06-18 DIAGNOSIS — H6521 Chronic serous otitis media, right ear: Secondary | ICD-10-CM | POA: Diagnosis not present

## 2017-07-06 IMAGING — CR DG KNEE COMPLETE 4+V*L*
4 series · 4 of 4 positions shown · non-contrast
Comparison: Report of the MRI of the left knee dated 14 July, 2003.

CLINICAL DATA: The patient slipped in the bathtub 2 weeks ago
injuring the left shoulder in the; persistent left knee pain
especially laterally ; PICC patient have history of arthroscopic
surgery of the left knee in the past.

EXAM:
LEFT KNEE - COMPLETE 4+ VIEW

[knee ap]
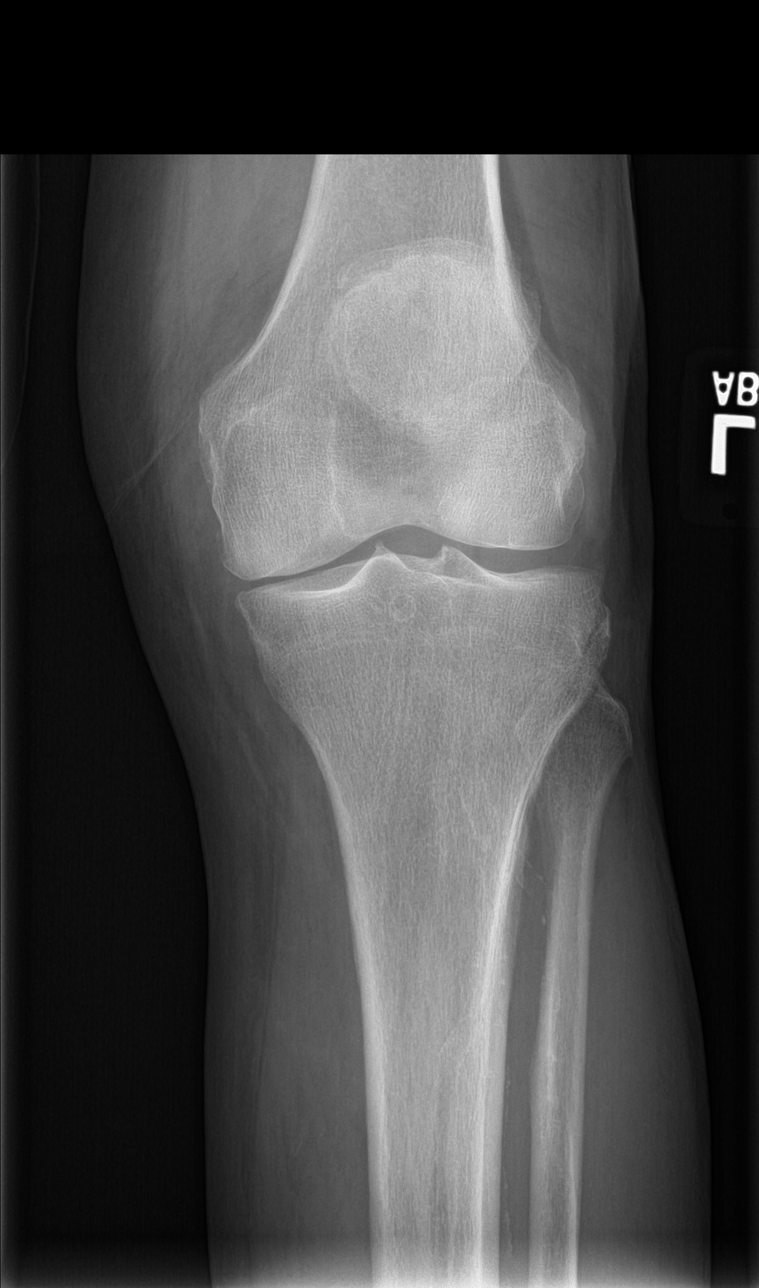

[tunnel]
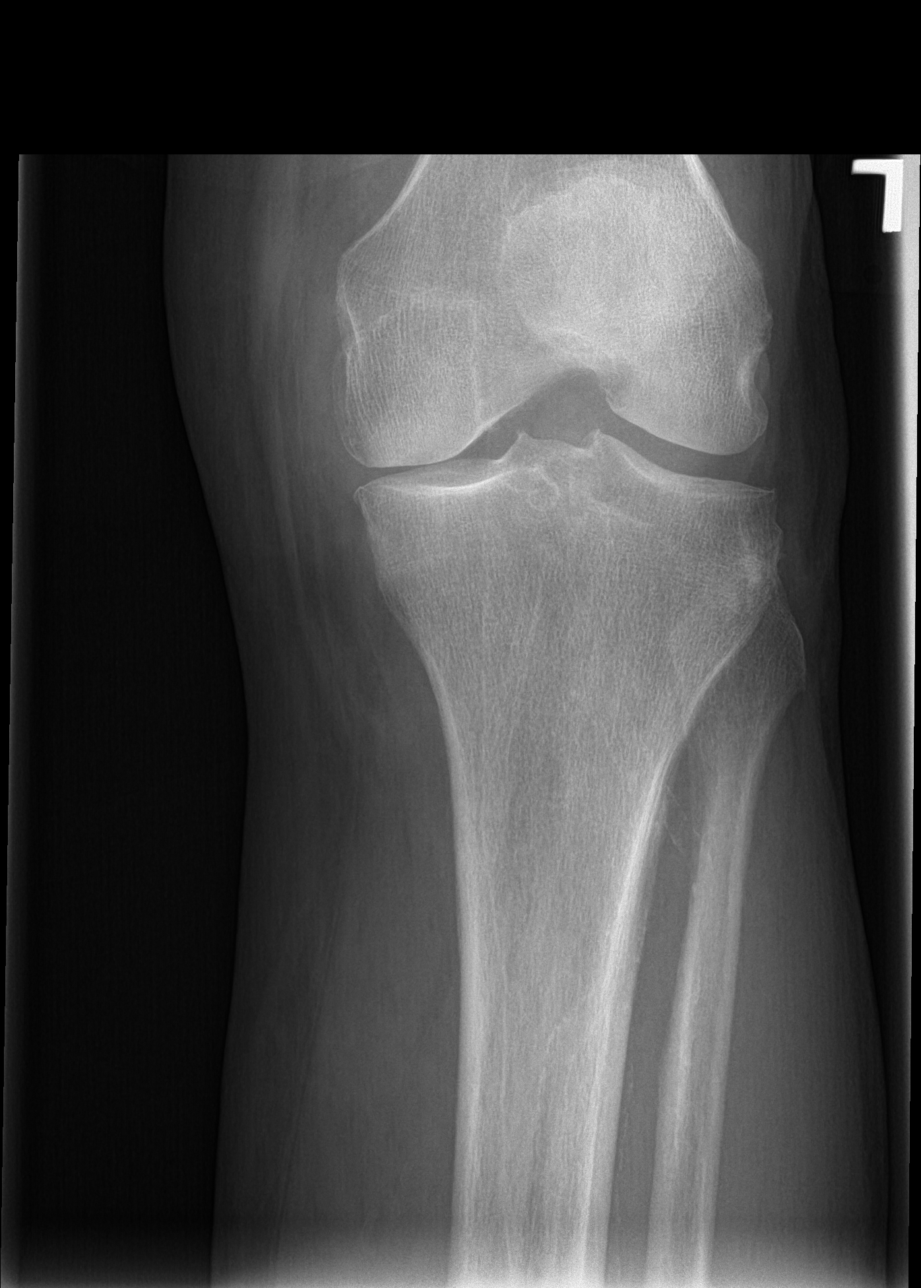

[knee lat]
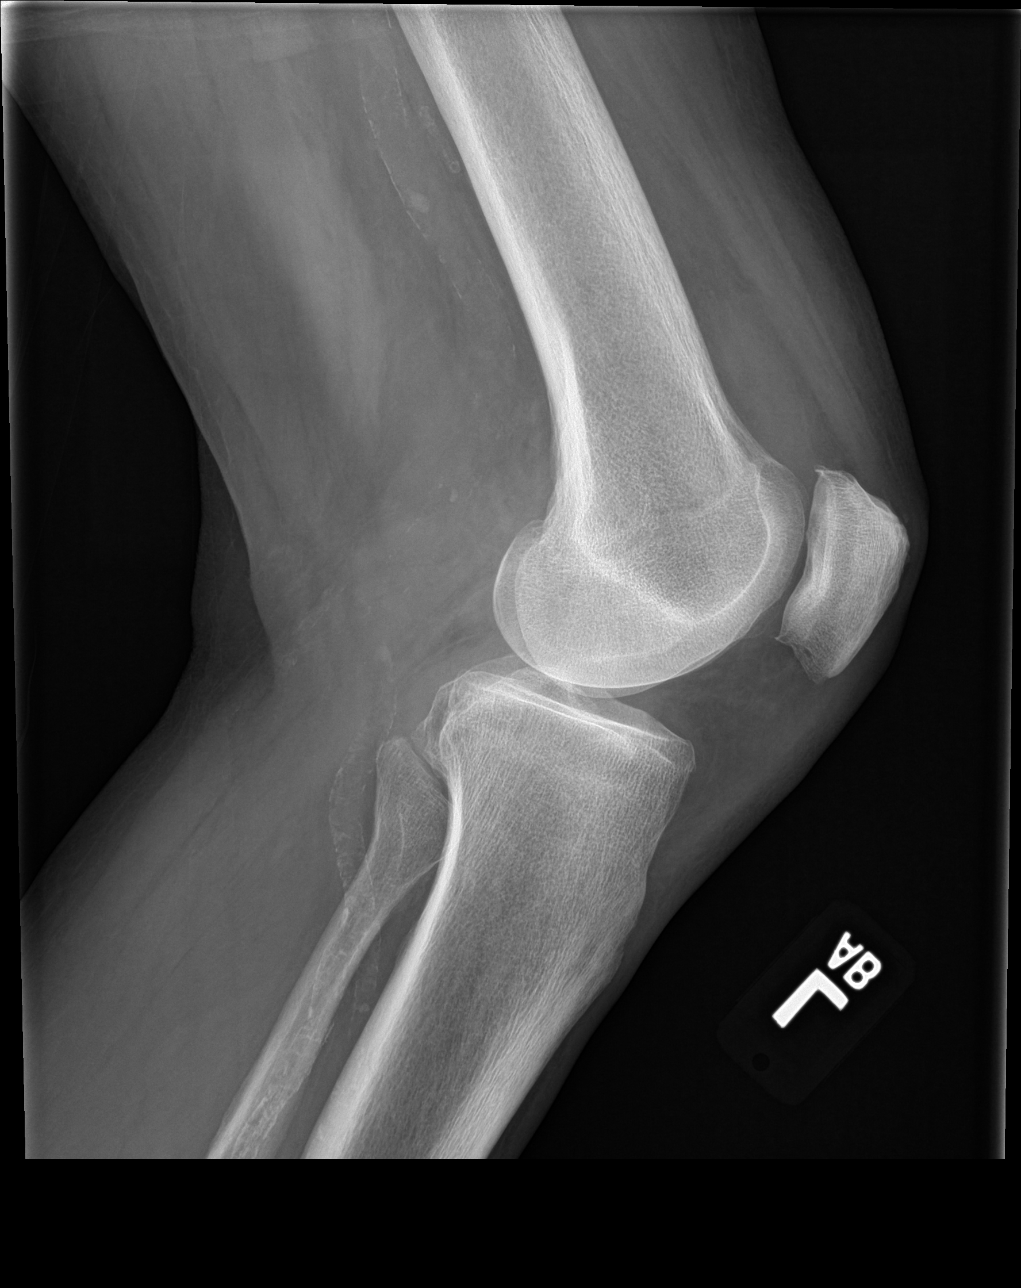

[patella skyline]
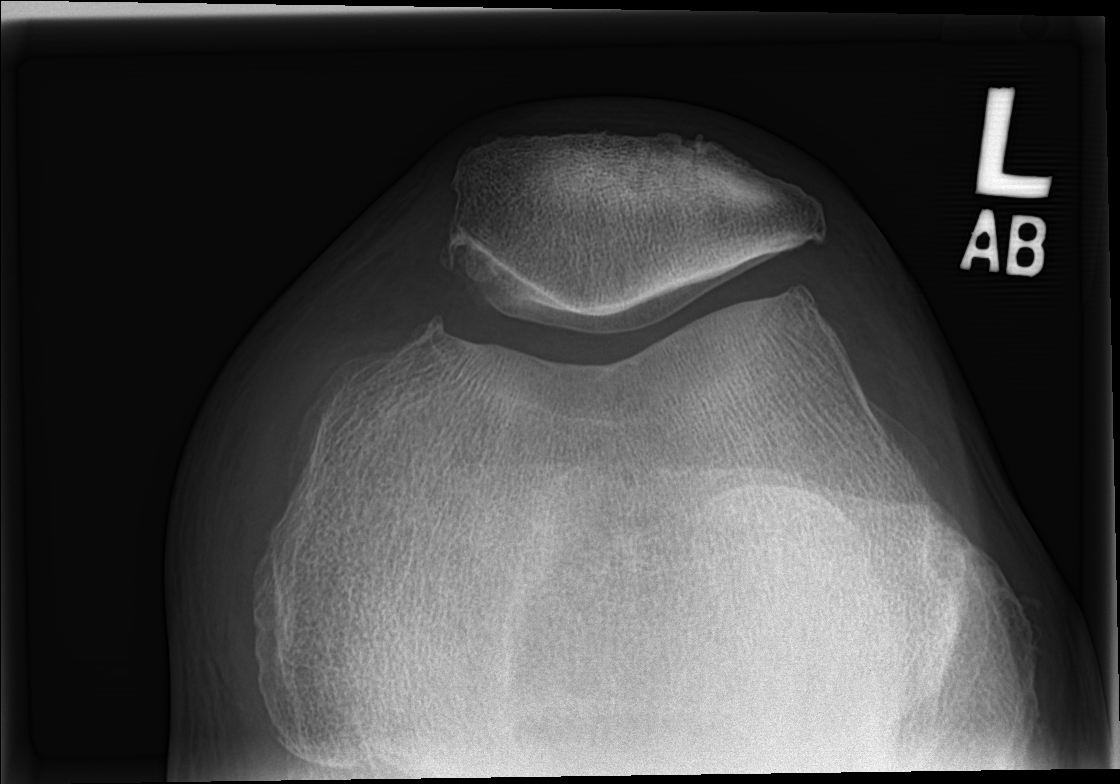

[4 of 4 positions shown; findings below may reference images not displayed]

FINDINGS: The bones are adequately mineralized. There is mild narrowing of the
medial joint compartment. There is beaking of the tibial spines.
There are spurs arising from the articular margins of the patella
and adjacent articular surface of the femur. There is no joint
effusion. There is no acute or healing fracture.
IMPRESSION: There is mild to moderate osteoarthritic change centered on the
medial and patellofemoral compartments. There is no acute or healing
fracture nor dislocation.

## 2017-07-14 DIAGNOSIS — K51019 Ulcerative (chronic) pancolitis with unspecified complications: Secondary | ICD-10-CM | POA: Diagnosis not present

## 2017-07-25 DIAGNOSIS — M7022 Olecranon bursitis, left elbow: Secondary | ICD-10-CM | POA: Diagnosis not present

## 2017-07-25 DIAGNOSIS — J449 Chronic obstructive pulmonary disease, unspecified: Secondary | ICD-10-CM | POA: Diagnosis not present

## 2017-07-25 DIAGNOSIS — Z9981 Dependence on supplemental oxygen: Secondary | ICD-10-CM | POA: Diagnosis not present

## 2017-07-25 DIAGNOSIS — Z6826 Body mass index (BMI) 26.0-26.9, adult: Secondary | ICD-10-CM | POA: Diagnosis not present

## 2017-08-01 DIAGNOSIS — M7022 Olecranon bursitis, left elbow: Secondary | ICD-10-CM | POA: Diagnosis not present

## 2017-08-01 DIAGNOSIS — Z6826 Body mass index (BMI) 26.0-26.9, adult: Secondary | ICD-10-CM | POA: Diagnosis not present

## 2017-08-01 DIAGNOSIS — D485 Neoplasm of uncertain behavior of skin: Secondary | ICD-10-CM | POA: Diagnosis not present

## 2017-08-11 DIAGNOSIS — Z6825 Body mass index (BMI) 25.0-25.9, adult: Secondary | ICD-10-CM | POA: Diagnosis not present

## 2017-08-11 DIAGNOSIS — J069 Acute upper respiratory infection, unspecified: Secondary | ICD-10-CM | POA: Diagnosis not present

## 2017-08-13 DIAGNOSIS — Z6825 Body mass index (BMI) 25.0-25.9, adult: Secondary | ICD-10-CM | POA: Diagnosis not present

## 2017-08-13 DIAGNOSIS — R05 Cough: Secondary | ICD-10-CM | POA: Diagnosis not present

## 2017-08-13 DIAGNOSIS — R350 Frequency of micturition: Secondary | ICD-10-CM | POA: Diagnosis not present

## 2017-08-21 DIAGNOSIS — K5100 Ulcerative (chronic) pancolitis without complications: Secondary | ICD-10-CM | POA: Diagnosis not present

## 2017-08-25 DIAGNOSIS — Z6824 Body mass index (BMI) 24.0-24.9, adult: Secondary | ICD-10-CM | POA: Diagnosis not present

## 2017-08-25 DIAGNOSIS — R05 Cough: Secondary | ICD-10-CM | POA: Diagnosis not present

## 2017-08-27 DIAGNOSIS — R05 Cough: Secondary | ICD-10-CM | POA: Diagnosis not present

## 2017-08-27 DIAGNOSIS — D485 Neoplasm of uncertain behavior of skin: Secondary | ICD-10-CM | POA: Diagnosis not present

## 2017-08-27 DIAGNOSIS — Z6824 Body mass index (BMI) 24.0-24.9, adult: Secondary | ICD-10-CM | POA: Diagnosis not present

## 2017-09-01 DIAGNOSIS — C4492 Squamous cell carcinoma of skin, unspecified: Secondary | ICD-10-CM | POA: Diagnosis not present

## 2017-09-01 DIAGNOSIS — Z6824 Body mass index (BMI) 24.0-24.9, adult: Secondary | ICD-10-CM | POA: Diagnosis not present

## 2017-09-01 DIAGNOSIS — C4442 Squamous cell carcinoma of skin of scalp and neck: Secondary | ICD-10-CM | POA: Diagnosis not present

## 2017-09-01 DIAGNOSIS — J441 Chronic obstructive pulmonary disease with (acute) exacerbation: Secondary | ICD-10-CM | POA: Diagnosis not present

## 2017-09-16 ENCOUNTER — Other Ambulatory Visit: Payer: Self-pay | Admitting: Family Medicine

## 2017-09-16 ENCOUNTER — Ambulatory Visit
Admission: RE | Admit: 2017-09-16 | Discharge: 2017-09-16 | Disposition: A | Discharge status: Home / Self Care | Payer: PPO | Source: Ambulatory Visit | Attending: Family Medicine | Admitting: Family Medicine

## 2017-09-16 DIAGNOSIS — R05 Cough: Secondary | ICD-10-CM | POA: Insufficient documentation

## 2017-09-16 DIAGNOSIS — J8410 Pulmonary fibrosis, unspecified: Secondary | ICD-10-CM | POA: Diagnosis not present

## 2017-09-16 DIAGNOSIS — J449 Chronic obstructive pulmonary disease, unspecified: Secondary | ICD-10-CM | POA: Diagnosis not present

## 2017-09-16 DIAGNOSIS — R059 Cough, unspecified: Secondary | ICD-10-CM

## 2017-09-16 DIAGNOSIS — R918 Other nonspecific abnormal finding of lung field: Secondary | ICD-10-CM | POA: Diagnosis not present

## 2017-09-17 DIAGNOSIS — H6123 Impacted cerumen, bilateral: Secondary | ICD-10-CM | POA: Diagnosis not present

## 2017-09-17 DIAGNOSIS — H6980 Other specified disorders of Eustachian tube, unspecified ear: Secondary | ICD-10-CM | POA: Diagnosis not present

## 2017-09-18 DIAGNOSIS — K51019 Ulcerative (chronic) pancolitis with unspecified complications: Secondary | ICD-10-CM | POA: Diagnosis not present

## 2017-10-07 DIAGNOSIS — K51919 Ulcerative colitis, unspecified with unspecified complications: Secondary | ICD-10-CM | POA: Diagnosis not present

## 2017-10-16 DIAGNOSIS — Z79899 Other long term (current) drug therapy: Secondary | ICD-10-CM | POA: Diagnosis not present

## 2017-10-16 DIAGNOSIS — K5100 Ulcerative (chronic) pancolitis without complications: Secondary | ICD-10-CM | POA: Diagnosis not present

## 2017-10-29 DIAGNOSIS — C4442 Squamous cell carcinoma of skin of scalp and neck: Secondary | ICD-10-CM | POA: Diagnosis not present

## 2017-10-29 DIAGNOSIS — B079 Viral wart, unspecified: Secondary | ICD-10-CM | POA: Diagnosis not present

## 2017-10-29 DIAGNOSIS — C4492 Squamous cell carcinoma of skin, unspecified: Secondary | ICD-10-CM | POA: Diagnosis not present

## 2017-11-13 DIAGNOSIS — K51019 Ulcerative (chronic) pancolitis with unspecified complications: Secondary | ICD-10-CM | POA: Diagnosis not present

## 2017-12-11 DIAGNOSIS — K5100 Ulcerative (chronic) pancolitis without complications: Secondary | ICD-10-CM | POA: Diagnosis not present

## 2018-01-07 DIAGNOSIS — Z85828 Personal history of other malignant neoplasm of skin: Secondary | ICD-10-CM | POA: Diagnosis not present

## 2018-01-07 DIAGNOSIS — K219 Gastro-esophageal reflux disease without esophagitis: Secondary | ICD-10-CM | POA: Diagnosis not present

## 2018-01-07 DIAGNOSIS — Z6826 Body mass index (BMI) 26.0-26.9, adult: Secondary | ICD-10-CM | POA: Diagnosis not present

## 2018-01-07 DIAGNOSIS — D485 Neoplasm of uncertain behavior of skin: Secondary | ICD-10-CM | POA: Diagnosis not present

## 2018-01-07 DIAGNOSIS — C44319 Basal cell carcinoma of skin of other parts of face: Secondary | ICD-10-CM | POA: Diagnosis not present

## 2018-01-07 DIAGNOSIS — C44629 Squamous cell carcinoma of skin of left upper limb, including shoulder: Secondary | ICD-10-CM | POA: Diagnosis not present

## 2018-01-07 DIAGNOSIS — R1013 Epigastric pain: Secondary | ICD-10-CM | POA: Diagnosis not present

## 2018-01-08 DIAGNOSIS — K5100 Ulcerative (chronic) pancolitis without complications: Secondary | ICD-10-CM | POA: Diagnosis not present

## 2018-02-02 DIAGNOSIS — H6123 Impacted cerumen, bilateral: Secondary | ICD-10-CM | POA: Diagnosis not present

## 2018-02-02 DIAGNOSIS — H6980 Other specified disorders of Eustachian tube, unspecified ear: Secondary | ICD-10-CM | POA: Diagnosis not present

## 2018-02-05 DIAGNOSIS — K51019 Ulcerative (chronic) pancolitis with unspecified complications: Secondary | ICD-10-CM | POA: Diagnosis not present

## 2018-02-05 DIAGNOSIS — K5100 Ulcerative (chronic) pancolitis without complications: Secondary | ICD-10-CM | POA: Diagnosis not present

## 2018-03-04 DIAGNOSIS — K5100 Ulcerative (chronic) pancolitis without complications: Secondary | ICD-10-CM | POA: Diagnosis not present

## 2018-03-23 DIAGNOSIS — Z23 Encounter for immunization: Secondary | ICD-10-CM | POA: Diagnosis not present

## 2018-03-25 DIAGNOSIS — Z79899 Other long term (current) drug therapy: Secondary | ICD-10-CM | POA: Diagnosis not present

## 2018-03-25 DIAGNOSIS — Z9981 Dependence on supplemental oxygen: Secondary | ICD-10-CM | POA: Diagnosis not present

## 2018-03-25 DIAGNOSIS — J449 Chronic obstructive pulmonary disease, unspecified: Secondary | ICD-10-CM | POA: Diagnosis not present

## 2018-03-25 DIAGNOSIS — Z7952 Long term (current) use of systemic steroids: Secondary | ICD-10-CM | POA: Diagnosis not present

## 2018-03-25 DIAGNOSIS — K51919 Ulcerative colitis, unspecified with unspecified complications: Secondary | ICD-10-CM | POA: Diagnosis not present

## 2018-04-01 DIAGNOSIS — K5100 Ulcerative (chronic) pancolitis without complications: Secondary | ICD-10-CM | POA: Diagnosis not present

## 2018-04-03 ENCOUNTER — Other Ambulatory Visit: Payer: Self-pay

## 2018-04-03 NOTE — Patient Outreach (Signed)
[facility] [facility]) Care Management  04/03/2018  Darin Lopez 10/04/1940 [id number]  Nurse Call Line Referral Date: 04/02/18 Reason for Referral:  Question regarding oxygen Nurse call line recommendation: none listed Attempt #1   Telephone call to patient regarding referral. Contact answering phone states patient is not available. HIPAA compliant message left with call back phone number.   PLAN: RNCM will attempt 2nd telephone call to patient within 4 business days. RNCM will send outreach letter.   Quinn Plowman RN,BSN, [place] Telephonic  [phone]

## 2018-04-06 ENCOUNTER — Other Ambulatory Visit: Payer: Self-pay

## 2018-04-06 NOTE — Patient Outreach (Signed)
[facility]) Care Management  04/06/2018  GREGG WINCHELL 06-07-1941 [id number]  Nurse Call Line Referral Date: 04/02/18 Reason for Referral:  Question regarding oxygen Nurse call line recommendation: none listed Attempt #2  Telephone call to patient regarding.  Contact answering phone states patient is not at home.  HIPAA compliant voice message left with call back phone number.   PLAN: RNCM will attempt #3 telephone call to patient within 4 business days.   Quinn Plowman RN,BSN,CCM [facility] Telephonic  [phone]

## 2018-04-09 ENCOUNTER — Other Ambulatory Visit: Payer: Self-pay

## 2018-04-09 NOTE — Patient Outreach (Signed)
[place] [facility]) Care Management  04/09/2018  DONTREAL MIERA 02-05-1941 [id number]  Nurse Call Line Referral Date:04/02/18 Reason for Referral: Question regarding oxygen Nurse call line recommendation:none listed Attempt 32   Telephone call to patient regarding.  Contact answering phone states patient is not at home.  HIPAA compliant voice message left with call back phone number.   PLAN: If no return call will proceed with closure  Quinn Plowman RN,BSN,CCM [facility] Telephonic  [phone]

## 2018-04-17 ENCOUNTER — Other Ambulatory Visit: Payer: Self-pay

## 2018-04-17 NOTE — Patient Outreach (Signed)
[facility]) Care Management  04/17/2018  EBB CARELOCK 1940/12/19 [id number]   Nurse Call Line Referral Date:04/02/18 Reason for Referral: Question regarding oxygen Nurse call line recommendation:none listed   No response after 3 telephone calls and outreach letter attempt.  PLAN: RNCM will close patient due to being unable to reach.  RNCM will send closure notification to patient's primary MD   Quinn Plowman RN,BSN,CCM [facility] Telephonic  [phone]

## 2018-04-22 DIAGNOSIS — Z9981 Dependence on supplemental oxygen: Secondary | ICD-10-CM | POA: Diagnosis not present

## 2018-04-22 DIAGNOSIS — I10 Essential (primary) hypertension: Secondary | ICD-10-CM | POA: Diagnosis not present

## 2018-04-22 DIAGNOSIS — Z0001 Encounter for general adult medical examination with abnormal findings: Secondary | ICD-10-CM | POA: Diagnosis not present

## 2018-04-22 DIAGNOSIS — Z6826 Body mass index (BMI) 26.0-26.9, adult: Secondary | ICD-10-CM | POA: Diagnosis not present

## 2018-04-22 DIAGNOSIS — F5101 Primary insomnia: Secondary | ICD-10-CM | POA: Diagnosis not present

## 2018-04-22 DIAGNOSIS — K219 Gastro-esophageal reflux disease without esophagitis: Secondary | ICD-10-CM | POA: Diagnosis not present

## 2018-04-22 DIAGNOSIS — F411 Generalized anxiety disorder: Secondary | ICD-10-CM | POA: Diagnosis not present

## 2018-04-22 DIAGNOSIS — J449 Chronic obstructive pulmonary disease, unspecified: Secondary | ICD-10-CM | POA: Diagnosis not present

## 2018-04-22 DIAGNOSIS — Z79899 Other long term (current) drug therapy: Secondary | ICD-10-CM | POA: Diagnosis not present

## 2018-04-22 DIAGNOSIS — M1000 Idiopathic gout, unspecified site: Secondary | ICD-10-CM | POA: Diagnosis not present

## 2018-04-29 DIAGNOSIS — K5100 Ulcerative (chronic) pancolitis without complications: Secondary | ICD-10-CM | POA: Diagnosis not present

## 2018-05-12 DIAGNOSIS — Z6825 Body mass index (BMI) 25.0-25.9, adult: Secondary | ICD-10-CM | POA: Diagnosis not present

## 2018-05-12 DIAGNOSIS — D485 Neoplasm of uncertain behavior of skin: Secondary | ICD-10-CM | POA: Diagnosis not present

## 2018-05-12 DIAGNOSIS — C44629 Squamous cell carcinoma of skin of left upper limb, including shoulder: Secondary | ICD-10-CM | POA: Diagnosis not present

## 2018-05-12 DIAGNOSIS — Z85828 Personal history of other malignant neoplasm of skin: Secondary | ICD-10-CM | POA: Diagnosis not present

## 2018-05-14 DIAGNOSIS — J441 Chronic obstructive pulmonary disease with (acute) exacerbation: Secondary | ICD-10-CM | POA: Diagnosis not present

## 2018-05-14 DIAGNOSIS — Z6826 Body mass index (BMI) 26.0-26.9, adult: Secondary | ICD-10-CM | POA: Diagnosis not present

## 2018-05-20 ENCOUNTER — Other Ambulatory Visit: Payer: Self-pay

## 2018-05-20 NOTE — Patient Outreach (Addendum)
[facility]) Care Management  05/20/2018  Darin Lopez 1941-04-27 [id number]   TELEPHONE SCREENING Referral date: 05/18/18 Referral source: self / family member referral Referral reason: assistance with oxygen tank refills Insurance: [employer].   Telephone call to patient regarding self referral. HIPAA verified with patient. Explained reason for call.  Patient request RNCM speak with his significant other, Virgel Bouquet regarding all of his health information.   Explained reason for call to Ms. Grandville Silos. Ms. Grandville Silos states she was able to contact [facility] and they will be coming out to fill patients oxygen tanks on tomorrow 05/21/18.  Ms. Grandville Silos denies patient having any further needs at this time.  RNCM offered to mail [facility] care management brochure/ magnet for future use.  Ms. Grandville Silos verbally  Agreed.  RNCM confirmed patients mailing address with Ms. Grandville Silos.   PLAN: RNCm will close patient due to patient being assessed and having no further needs.  RNCm will mail [facility] care management brochure/ magnet to patient as discussed.   Quinn Plowman RN,BSN,CCM [facility] Telephonic  [phone]

## 2018-05-20 NOTE — Telephone Encounter (Signed)
This encounter was created in error - please disregard.

## 2018-05-27 DIAGNOSIS — K5100 Ulcerative (chronic) pancolitis without complications: Secondary | ICD-10-CM | POA: Diagnosis not present

## 2018-06-04 DIAGNOSIS — H6123 Impacted cerumen, bilateral: Secondary | ICD-10-CM | POA: Diagnosis not present

## 2018-06-09 DIAGNOSIS — Z6825 Body mass index (BMI) 25.0-25.9, adult: Secondary | ICD-10-CM | POA: Diagnosis not present

## 2018-06-09 DIAGNOSIS — G4700 Insomnia, unspecified: Secondary | ICD-10-CM | POA: Diagnosis not present

## 2018-06-09 DIAGNOSIS — R05 Cough: Secondary | ICD-10-CM | POA: Diagnosis not present

## 2018-06-22 DIAGNOSIS — Z6825 Body mass index (BMI) 25.0-25.9, adult: Secondary | ICD-10-CM | POA: Diagnosis not present

## 2018-06-22 DIAGNOSIS — R05 Cough: Secondary | ICD-10-CM | POA: Diagnosis not present

## 2018-06-25 DIAGNOSIS — K5100 Ulcerative (chronic) pancolitis without complications: Secondary | ICD-10-CM | POA: Diagnosis not present

## 2018-06-25 DIAGNOSIS — Z79899 Other long term (current) drug therapy: Secondary | ICD-10-CM | POA: Diagnosis not present

## 2018-07-23 DIAGNOSIS — K51019 Ulcerative (chronic) pancolitis with unspecified complications: Secondary | ICD-10-CM | POA: Diagnosis not present

## 2020-07-01 DEATH — deceased
